# Patient Record
Sex: Female | Born: 1955 | Race: White | Hispanic: No | Smoking: Never smoker
Health system: Southern US, Community
[De-identification: ages and names within clinical notes are randomized; demographics above are authoritative.]

## PROBLEM LIST (undated history)

## (undated) DIAGNOSIS — I4891 Unspecified atrial fibrillation: Secondary | ICD-10-CM

## (undated) DIAGNOSIS — I509 Heart failure, unspecified: Secondary | ICD-10-CM

## (undated) DIAGNOSIS — G4700 Insomnia, unspecified: Secondary | ICD-10-CM

## (undated) DIAGNOSIS — M1990 Unspecified osteoarthritis, unspecified site: Secondary | ICD-10-CM

## (undated) DIAGNOSIS — I4819 Other persistent atrial fibrillation: Secondary | ICD-10-CM

## (undated) DIAGNOSIS — F32A Depression, unspecified: Secondary | ICD-10-CM

## (undated) DIAGNOSIS — I10 Essential (primary) hypertension: Secondary | ICD-10-CM

## (undated) DIAGNOSIS — M25562 Pain in left knee: Secondary | ICD-10-CM

## (undated) DIAGNOSIS — M25561 Pain in right knee: Secondary | ICD-10-CM

## (undated) DIAGNOSIS — N289 Disorder of kidney and ureter, unspecified: Secondary | ICD-10-CM

## (undated) DIAGNOSIS — F329 Major depressive disorder, single episode, unspecified: Secondary | ICD-10-CM

## (undated) HISTORY — PX: OTHER SURGICAL HISTORY: SHX169

## (undated) HISTORY — PX: ABDOMINAL HYSTERECTOMY: SHX81

## (undated) HISTORY — DX: Essential (primary) hypertension: I10

## (undated) HISTORY — DX: Morbid (severe) obesity due to excess calories: E66.01

## (undated) HISTORY — DX: Unspecified atrial fibrillation: I48.91

## (undated) HISTORY — DX: Unspecified osteoarthritis, unspecified site: M19.90

## (undated) HISTORY — PX: TONSILLECTOMY: SUR1361

## (undated) HISTORY — DX: Insomnia, unspecified: G47.00

## (undated) HISTORY — DX: Pain in left knee: M25.562

## (undated) HISTORY — PX: VESICOVAGINAL FISTULA CLOSURE W/ TAH: SUR271

## (undated) HISTORY — DX: Depression, unspecified: F32.A

## (undated) HISTORY — DX: Major depressive disorder, single episode, unspecified: F32.9

## (undated) HISTORY — DX: Pain in right knee: M25.561

## (undated) HISTORY — DX: Other persistent atrial fibrillation: I48.19

---

## 1998-11-14 ENCOUNTER — Emergency Department (HOSPITAL_COMMUNITY): Admission: EM | Admit: 1998-11-14 | Discharge: 1998-11-14 | Payer: Self-pay | Admitting: Emergency Medicine

## 1998-12-26 ENCOUNTER — Inpatient Hospital Stay (HOSPITAL_COMMUNITY): Admission: EM | Admit: 1998-12-26 | Discharge: 1998-12-28 | Payer: Self-pay | Admitting: Internal Medicine

## 1999-04-03 HISTORY — PX: CHOLECYSTECTOMY: SHX55

## 1999-06-13 ENCOUNTER — Emergency Department (HOSPITAL_COMMUNITY): Admission: EM | Admit: 1999-06-13 | Discharge: 1999-06-13 | Payer: Self-pay | Admitting: *Deleted

## 2000-05-22 ENCOUNTER — Encounter: Admission: RE | Admit: 2000-05-22 | Discharge: 2000-08-20 | Payer: Self-pay | Admitting: Surgical Oncology

## 2000-07-08 ENCOUNTER — Encounter (HOSPITAL_COMMUNITY): Admission: RE | Admit: 2000-07-08 | Discharge: 2000-08-07 | Payer: Self-pay | Admitting: *Deleted

## 2000-12-04 ENCOUNTER — Other Ambulatory Visit: Admission: RE | Admit: 2000-12-04 | Discharge: 2000-12-04 | Payer: Self-pay | Admitting: Obstetrics and Gynecology

## 2001-06-26 ENCOUNTER — Emergency Department (HOSPITAL_COMMUNITY): Admission: EM | Admit: 2001-06-26 | Discharge: 2001-06-27 | Payer: Self-pay | Admitting: Emergency Medicine

## 2001-06-27 ENCOUNTER — Encounter: Payer: Self-pay | Admitting: Emergency Medicine

## 2001-09-16 ENCOUNTER — Ambulatory Visit (HOSPITAL_COMMUNITY): Admission: RE | Admit: 2001-09-16 | Discharge: 2001-09-16 | Payer: Self-pay | Admitting: Ophthalmology

## 2001-12-18 ENCOUNTER — Encounter: Payer: Self-pay | Admitting: Internal Medicine

## 2001-12-18 ENCOUNTER — Observation Stay (HOSPITAL_COMMUNITY): Admission: EM | Admit: 2001-12-18 | Discharge: 2001-12-18 | Payer: Self-pay | Admitting: Internal Medicine

## 2001-12-19 ENCOUNTER — Encounter (HOSPITAL_COMMUNITY): Admission: RE | Admit: 2001-12-19 | Discharge: 2002-01-18 | Payer: Self-pay | Admitting: Cardiology

## 2002-08-19 ENCOUNTER — Inpatient Hospital Stay (HOSPITAL_COMMUNITY): Admission: EM | Admit: 2002-08-19 | Discharge: 2002-08-21 | Payer: Self-pay | Admitting: Emergency Medicine

## 2002-12-14 ENCOUNTER — Ambulatory Visit (HOSPITAL_COMMUNITY): Admission: RE | Admit: 2002-12-14 | Discharge: 2002-12-14 | Payer: Self-pay | Admitting: Pulmonary Disease

## 2003-07-08 ENCOUNTER — Ambulatory Visit (HOSPITAL_COMMUNITY): Admission: RE | Admit: 2003-07-08 | Discharge: 2003-07-08 | Payer: Self-pay | Admitting: Obstetrics & Gynecology

## 2003-09-07 ENCOUNTER — Inpatient Hospital Stay (HOSPITAL_COMMUNITY): Admission: RE | Admit: 2003-09-07 | Discharge: 2003-09-10 | Payer: Self-pay | Admitting: Obstetrics & Gynecology

## 2004-04-19 ENCOUNTER — Other Ambulatory Visit: Admission: RE | Admit: 2004-04-19 | Discharge: 2004-04-19 | Payer: Self-pay | Admitting: Dermatology

## 2004-12-20 ENCOUNTER — Ambulatory Visit: Payer: Self-pay | Admitting: Orthopedic Surgery

## 2005-01-01 ENCOUNTER — Ambulatory Visit: Payer: Self-pay | Admitting: Orthopedic Surgery

## 2005-01-18 ENCOUNTER — Ambulatory Visit: Payer: Self-pay | Admitting: Orthopedic Surgery

## 2005-01-23 ENCOUNTER — Ambulatory Visit (HOSPITAL_COMMUNITY): Admission: RE | Admit: 2005-01-23 | Discharge: 2005-01-23 | Payer: Self-pay | Admitting: Orthopedic Surgery

## 2005-01-23 ENCOUNTER — Ambulatory Visit: Payer: Self-pay | Admitting: Orthopedic Surgery

## 2005-01-25 ENCOUNTER — Ambulatory Visit: Payer: Self-pay | Admitting: Orthopedic Surgery

## 2005-01-26 ENCOUNTER — Encounter (HOSPITAL_COMMUNITY): Admission: RE | Admit: 2005-01-26 | Discharge: 2005-02-25 | Payer: Self-pay | Admitting: Orthopedic Surgery

## 2005-02-28 ENCOUNTER — Ambulatory Visit: Payer: Self-pay | Admitting: Orthopedic Surgery

## 2005-04-02 HISTORY — PX: COLONOSCOPY: SHX174

## 2006-03-14 ENCOUNTER — Ambulatory Visit: Payer: Self-pay | Admitting: Internal Medicine

## 2006-03-21 ENCOUNTER — Ambulatory Visit: Payer: Self-pay | Admitting: Internal Medicine

## 2006-03-21 ENCOUNTER — Ambulatory Visit (HOSPITAL_COMMUNITY): Admission: RE | Admit: 2006-03-21 | Discharge: 2006-03-21 | Payer: Self-pay | Admitting: Internal Medicine

## 2006-03-21 ENCOUNTER — Encounter (INDEPENDENT_AMBULATORY_CARE_PROVIDER_SITE_OTHER): Payer: Self-pay | Admitting: Specialist

## 2007-01-14 ENCOUNTER — Emergency Department (HOSPITAL_COMMUNITY): Admission: EM | Admit: 2007-01-14 | Discharge: 2007-01-14 | Payer: Self-pay | Admitting: Emergency Medicine

## 2007-09-02 ENCOUNTER — Emergency Department (HOSPITAL_COMMUNITY): Admission: EM | Admit: 2007-09-02 | Discharge: 2007-09-02 | Payer: Self-pay | Admitting: Emergency Medicine

## 2008-06-02 ENCOUNTER — Emergency Department (HOSPITAL_COMMUNITY): Admission: EM | Admit: 2008-06-02 | Discharge: 2008-06-02 | Payer: Self-pay | Admitting: Emergency Medicine

## 2008-06-07 ENCOUNTER — Ambulatory Visit (HOSPITAL_COMMUNITY): Admission: RE | Admit: 2008-06-07 | Discharge: 2008-06-07 | Payer: Self-pay | Admitting: Pulmonary Disease

## 2008-10-26 ENCOUNTER — Emergency Department (HOSPITAL_COMMUNITY): Admission: EM | Admit: 2008-10-26 | Discharge: 2008-10-26 | Payer: Self-pay | Admitting: Emergency Medicine

## 2009-05-23 ENCOUNTER — Ambulatory Visit: Payer: Self-pay | Admitting: Cardiology

## 2009-05-23 ENCOUNTER — Inpatient Hospital Stay (HOSPITAL_COMMUNITY): Admission: EM | Admit: 2009-05-23 | Discharge: 2009-05-27 | Payer: Self-pay | Admitting: Emergency Medicine

## 2009-05-24 ENCOUNTER — Encounter (INDEPENDENT_AMBULATORY_CARE_PROVIDER_SITE_OTHER): Payer: Self-pay | Admitting: Internal Medicine

## 2009-05-27 ENCOUNTER — Encounter (INDEPENDENT_AMBULATORY_CARE_PROVIDER_SITE_OTHER): Payer: Self-pay | Admitting: *Deleted

## 2009-05-27 LAB — CONVERTED CEMR LAB: Hgb A1c MFr Bld: 5.8 %

## 2009-05-31 ENCOUNTER — Encounter (INDEPENDENT_AMBULATORY_CARE_PROVIDER_SITE_OTHER): Payer: Self-pay | Admitting: *Deleted

## 2009-05-31 DIAGNOSIS — I4891 Unspecified atrial fibrillation: Secondary | ICD-10-CM | POA: Insufficient documentation

## 2009-05-31 DIAGNOSIS — J189 Pneumonia, unspecified organism: Secondary | ICD-10-CM | POA: Insufficient documentation

## 2009-05-31 DIAGNOSIS — J1189 Influenza due to unidentified influenza virus with other manifestations: Secondary | ICD-10-CM | POA: Insufficient documentation

## 2009-05-31 DIAGNOSIS — E876 Hypokalemia: Secondary | ICD-10-CM | POA: Insufficient documentation

## 2009-06-02 ENCOUNTER — Encounter: Payer: Self-pay | Admitting: Adult Health

## 2009-06-02 ENCOUNTER — Ambulatory Visit: Payer: Self-pay | Admitting: Cardiology

## 2009-06-02 DIAGNOSIS — I10 Essential (primary) hypertension: Secondary | ICD-10-CM | POA: Insufficient documentation

## 2009-06-03 ENCOUNTER — Telehealth (INDEPENDENT_AMBULATORY_CARE_PROVIDER_SITE_OTHER): Payer: Self-pay

## 2009-06-08 ENCOUNTER — Ambulatory Visit: Payer: Self-pay | Admitting: Cardiology

## 2009-06-13 ENCOUNTER — Ambulatory Visit: Payer: Self-pay | Admitting: Cardiology

## 2009-06-13 LAB — CONVERTED CEMR LAB: POC INR: 2.2

## 2009-06-15 ENCOUNTER — Ambulatory Visit: Payer: Self-pay | Admitting: Cardiology

## 2009-06-15 DIAGNOSIS — R002 Palpitations: Secondary | ICD-10-CM | POA: Insufficient documentation

## 2009-06-20 ENCOUNTER — Ambulatory Visit: Payer: Self-pay | Admitting: Cardiology

## 2009-06-27 ENCOUNTER — Encounter (INDEPENDENT_AMBULATORY_CARE_PROVIDER_SITE_OTHER): Payer: Self-pay | Admitting: *Deleted

## 2009-06-27 ENCOUNTER — Ambulatory Visit: Payer: Self-pay | Admitting: Cardiology

## 2009-06-28 ENCOUNTER — Encounter (INDEPENDENT_AMBULATORY_CARE_PROVIDER_SITE_OTHER): Payer: Self-pay

## 2009-06-30 ENCOUNTER — Telehealth: Payer: Self-pay | Admitting: Cardiology

## 2009-07-04 ENCOUNTER — Telehealth: Payer: Self-pay | Admitting: Cardiology

## 2009-07-04 ENCOUNTER — Ambulatory Visit: Payer: Self-pay | Admitting: Cardiology

## 2009-07-11 ENCOUNTER — Ambulatory Visit: Payer: Self-pay | Admitting: Cardiology

## 2009-07-11 ENCOUNTER — Encounter (INDEPENDENT_AMBULATORY_CARE_PROVIDER_SITE_OTHER): Payer: Self-pay | Admitting: *Deleted

## 2009-07-11 ENCOUNTER — Encounter: Payer: Self-pay | Admitting: Cardiology

## 2009-07-11 LAB — CONVERTED CEMR LAB: POC INR: 3

## 2009-07-12 LAB — CONVERTED CEMR LAB
BUN: 12 mg/dL (ref 6–23)
Chloride: 105 meq/L (ref 96–112)
Glucose, Bld: 116 mg/dL — ABNORMAL HIGH (ref 70–99)
Potassium: 4.7 meq/L (ref 3.5–5.3)
Sodium: 141 meq/L (ref 135–145)

## 2009-07-14 ENCOUNTER — Ambulatory Visit: Payer: Self-pay | Admitting: Cardiology

## 2009-07-14 ENCOUNTER — Ambulatory Visit (HOSPITAL_COMMUNITY): Admission: RE | Admit: 2009-07-14 | Discharge: 2009-07-14 | Payer: Self-pay | Admitting: Cardiology

## 2009-07-18 ENCOUNTER — Ambulatory Visit (HOSPITAL_COMMUNITY): Admission: RE | Admit: 2009-07-18 | Discharge: 2009-07-18 | Payer: Self-pay | Admitting: Cardiovascular Disease

## 2009-07-18 ENCOUNTER — Ambulatory Visit: Payer: Self-pay | Admitting: Cardiovascular Disease

## 2009-07-18 ENCOUNTER — Telehealth (INDEPENDENT_AMBULATORY_CARE_PROVIDER_SITE_OTHER): Payer: Self-pay

## 2009-07-18 ENCOUNTER — Encounter: Payer: Self-pay | Admitting: Internal Medicine

## 2009-07-18 DIAGNOSIS — F411 Generalized anxiety disorder: Secondary | ICD-10-CM | POA: Insufficient documentation

## 2009-07-18 DIAGNOSIS — R0602 Shortness of breath: Secondary | ICD-10-CM | POA: Insufficient documentation

## 2009-07-19 ENCOUNTER — Encounter: Payer: Self-pay | Admitting: Cardiology

## 2009-07-19 ENCOUNTER — Encounter: Payer: Self-pay | Admitting: Internal Medicine

## 2009-07-19 ENCOUNTER — Ambulatory Visit: Payer: Self-pay | Admitting: Cardiology

## 2009-07-19 ENCOUNTER — Encounter: Payer: Self-pay | Admitting: Cardiovascular Disease

## 2009-07-21 ENCOUNTER — Encounter: Payer: Self-pay | Admitting: Cardiology

## 2009-07-22 ENCOUNTER — Encounter: Payer: Self-pay | Admitting: Internal Medicine

## 2009-07-25 ENCOUNTER — Encounter: Payer: Self-pay | Admitting: Cardiology

## 2009-07-25 ENCOUNTER — Encounter (INDEPENDENT_AMBULATORY_CARE_PROVIDER_SITE_OTHER): Payer: Self-pay | Admitting: Pharmacist

## 2009-07-26 ENCOUNTER — Encounter: Payer: Self-pay | Admitting: Cardiology

## 2009-07-26 ENCOUNTER — Telehealth: Payer: Self-pay | Admitting: Cardiology

## 2009-07-26 LAB — CONVERTED CEMR LAB
INR: 1.7
Prothrombin Time: 21 s

## 2009-07-29 ENCOUNTER — Telehealth: Payer: Self-pay | Admitting: Cardiology

## 2009-07-29 ENCOUNTER — Encounter: Payer: Self-pay | Admitting: Cardiology

## 2009-08-04 ENCOUNTER — Encounter: Payer: Self-pay | Admitting: Cardiology

## 2009-08-04 ENCOUNTER — Telehealth (INDEPENDENT_AMBULATORY_CARE_PROVIDER_SITE_OTHER): Payer: Self-pay | Admitting: *Deleted

## 2009-08-04 LAB — CONVERTED CEMR LAB
POC INR: 1.6
Prothrombin Time: 19.2 s

## 2009-08-11 ENCOUNTER — Telehealth (INDEPENDENT_AMBULATORY_CARE_PROVIDER_SITE_OTHER): Payer: Self-pay

## 2009-08-12 ENCOUNTER — Ambulatory Visit: Payer: Self-pay | Admitting: Cardiology

## 2009-08-15 ENCOUNTER — Telehealth: Payer: Self-pay | Admitting: Cardiology

## 2009-08-15 ENCOUNTER — Encounter: Payer: Self-pay | Admitting: Cardiology

## 2009-08-15 LAB — CONVERTED CEMR LAB: POC INR: 2.1

## 2009-08-16 ENCOUNTER — Encounter: Payer: Self-pay | Admitting: Cardiology

## 2009-08-24 ENCOUNTER — Ambulatory Visit: Payer: Self-pay | Admitting: Internal Medicine

## 2009-08-24 ENCOUNTER — Encounter: Payer: Self-pay | Admitting: Internal Medicine

## 2009-08-30 LAB — CONVERTED CEMR LAB
CO2: 27 meq/L (ref 19–32)
GFR calc non Af Amer: 84.93 mL/min (ref >60)
Glucose, Bld: 95 mg/dL (ref 70–99)
INR: 2.1 — ABNORMAL HIGH (ref 0.8–1.0)
Potassium: 4.4 meq/L (ref 3.5–5.1)

## 2009-08-31 ENCOUNTER — Encounter: Payer: Self-pay | Admitting: Cardiology

## 2009-08-31 ENCOUNTER — Telehealth: Payer: Self-pay | Admitting: Cardiology

## 2009-08-31 LAB — CONVERTED CEMR LAB
POC INR: 2.8
Prothrombin Time: 33.6 s

## 2009-09-01 ENCOUNTER — Encounter: Payer: Self-pay | Admitting: Cardiology

## 2009-09-05 ENCOUNTER — Ambulatory Visit: Admission: RE | Admit: 2009-09-05 | Discharge: 2009-09-05 | Payer: Self-pay | Admitting: *Deleted

## 2009-09-06 ENCOUNTER — Encounter: Payer: Self-pay | Admitting: Cardiology

## 2009-09-06 ENCOUNTER — Telehealth: Payer: Self-pay | Admitting: Cardiology

## 2009-09-06 LAB — CONVERTED CEMR LAB: POC INR: 2.6

## 2009-09-09 ENCOUNTER — Ambulatory Visit: Payer: Self-pay | Admitting: Cardiology

## 2009-09-14 ENCOUNTER — Telehealth: Payer: Self-pay | Admitting: Cardiology

## 2009-09-14 ENCOUNTER — Encounter: Payer: Self-pay | Admitting: Cardiology

## 2009-09-19 ENCOUNTER — Observation Stay (HOSPITAL_COMMUNITY): Admission: AD | Admit: 2009-09-19 | Discharge: 2009-09-20 | Payer: Self-pay | Admitting: Internal Medicine

## 2009-09-19 ENCOUNTER — Ambulatory Visit: Payer: Self-pay | Admitting: Internal Medicine

## 2009-09-21 ENCOUNTER — Encounter: Payer: Self-pay | Admitting: Cardiology

## 2009-09-21 ENCOUNTER — Telehealth (INDEPENDENT_AMBULATORY_CARE_PROVIDER_SITE_OTHER): Payer: Self-pay | Admitting: *Deleted

## 2009-09-21 LAB — CONVERTED CEMR LAB: POC INR: 2.7

## 2009-10-05 ENCOUNTER — Ambulatory Visit: Payer: Self-pay | Admitting: Internal Medicine

## 2009-10-10 ENCOUNTER — Inpatient Hospital Stay (HOSPITAL_COMMUNITY): Admission: AD | Admit: 2009-10-10 | Discharge: 2009-10-14 | Payer: Self-pay | Admitting: Internal Medicine

## 2009-10-10 ENCOUNTER — Ambulatory Visit: Payer: Self-pay | Admitting: Internal Medicine

## 2009-10-18 ENCOUNTER — Ambulatory Visit: Payer: Self-pay | Admitting: Cardiology

## 2009-10-18 ENCOUNTER — Encounter (INDEPENDENT_AMBULATORY_CARE_PROVIDER_SITE_OTHER): Payer: Self-pay | Admitting: *Deleted

## 2009-10-18 DIAGNOSIS — R252 Cramp and spasm: Secondary | ICD-10-CM | POA: Insufficient documentation

## 2009-10-18 LAB — CONVERTED CEMR LAB
CO2: 30 meq/L
Calcium: 9.4 mg/dL
Magnesium: 1.9 mg/dL
POC INR: 2.6
Sodium: 142 meq/L

## 2009-10-19 ENCOUNTER — Inpatient Hospital Stay (HOSPITAL_COMMUNITY): Admission: EM | Admit: 2009-10-19 | Discharge: 2009-10-22 | Payer: Self-pay | Admitting: Emergency Medicine

## 2009-10-19 ENCOUNTER — Ambulatory Visit: Payer: Self-pay | Admitting: Internal Medicine

## 2009-10-19 LAB — CONVERTED CEMR LAB
CO2: 30 meq/L (ref 19–32)
Calcium: 9.4 mg/dL (ref 8.4–10.5)
Chloride: 103 meq/L (ref 96–112)
Creatinine, Ser: 0.67 mg/dL (ref 0.40–1.20)
Magnesium: 1.9 mg/dL (ref 1.5–2.5)

## 2009-10-20 ENCOUNTER — Encounter: Payer: Self-pay | Admitting: Internal Medicine

## 2009-10-22 ENCOUNTER — Encounter: Payer: Self-pay | Admitting: Internal Medicine

## 2009-10-26 ENCOUNTER — Ambulatory Visit: Payer: Self-pay | Admitting: Cardiology

## 2009-10-26 LAB — CONVERTED CEMR LAB: POC INR: 2.5

## 2009-11-02 ENCOUNTER — Ambulatory Visit: Payer: Self-pay | Admitting: Cardiology

## 2009-11-02 LAB — CONVERTED CEMR LAB: POC INR: 2.1

## 2009-11-08 ENCOUNTER — Encounter (INDEPENDENT_AMBULATORY_CARE_PROVIDER_SITE_OTHER): Payer: Self-pay | Admitting: *Deleted

## 2009-11-11 ENCOUNTER — Ambulatory Visit: Payer: Self-pay | Admitting: Cardiology

## 2009-11-11 ENCOUNTER — Ambulatory Visit: Payer: Self-pay | Admitting: Internal Medicine

## 2009-11-11 LAB — CONVERTED CEMR LAB: POC INR: 2

## 2009-11-14 ENCOUNTER — Ambulatory Visit: Payer: Self-pay | Admitting: Cardiology

## 2009-11-17 ENCOUNTER — Ambulatory Visit: Payer: Self-pay | Admitting: Cardiology

## 2009-11-17 LAB — CONVERTED CEMR LAB: POC INR: 2

## 2009-12-08 ENCOUNTER — Ambulatory Visit: Payer: Self-pay | Admitting: Cardiology

## 2009-12-08 LAB — CONVERTED CEMR LAB: POC INR: 2.8

## 2009-12-15 ENCOUNTER — Ambulatory Visit: Payer: Self-pay | Admitting: Internal Medicine

## 2009-12-30 ENCOUNTER — Encounter (INDEPENDENT_AMBULATORY_CARE_PROVIDER_SITE_OTHER): Payer: Self-pay | Admitting: *Deleted

## 2010-01-05 ENCOUNTER — Ambulatory Visit: Payer: Self-pay

## 2010-01-05 LAB — CONVERTED CEMR LAB: POC INR: 1.2

## 2010-01-11 ENCOUNTER — Ambulatory Visit: Payer: Self-pay | Admitting: Cardiology

## 2010-01-11 LAB — CONVERTED CEMR LAB: POC INR: 2.5

## 2010-02-01 ENCOUNTER — Ambulatory Visit (HOSPITAL_COMMUNITY): Admission: RE | Admit: 2010-02-01 | Discharge: 2010-02-01 | Payer: Self-pay | Admitting: Pulmonary Disease

## 2010-02-01 ENCOUNTER — Encounter: Payer: Self-pay | Admitting: Orthopedic Surgery

## 2010-02-06 ENCOUNTER — Encounter (INDEPENDENT_AMBULATORY_CARE_PROVIDER_SITE_OTHER): Payer: Self-pay | Admitting: *Deleted

## 2010-02-09 ENCOUNTER — Ambulatory Visit: Payer: Self-pay | Admitting: Cardiology

## 2010-02-09 LAB — CONVERTED CEMR LAB: POC INR: 2.7

## 2010-03-01 ENCOUNTER — Ambulatory Visit: Payer: Self-pay | Admitting: Orthopedic Surgery

## 2010-03-01 DIAGNOSIS — M84376A Stress fracture, unspecified foot, initial encounter for fracture: Secondary | ICD-10-CM | POA: Insufficient documentation

## 2010-03-01 DIAGNOSIS — M659 Synovitis and tenosynovitis, unspecified: Secondary | ICD-10-CM | POA: Insufficient documentation

## 2010-03-09 ENCOUNTER — Ambulatory Visit: Payer: Self-pay | Admitting: Cardiology

## 2010-03-09 ENCOUNTER — Inpatient Hospital Stay (HOSPITAL_COMMUNITY): Admission: EM | Admit: 2010-03-09 | Discharge: 2009-07-21 | Payer: Self-pay | Admitting: Emergency Medicine

## 2010-03-09 LAB — CONVERTED CEMR LAB: POC INR: 2.4

## 2010-04-12 ENCOUNTER — Ambulatory Visit: Admission: RE | Admit: 2010-04-12 | Discharge: 2010-04-12 | Payer: Self-pay | Source: Home / Self Care

## 2010-04-19 ENCOUNTER — Telehealth: Payer: Self-pay | Admitting: Orthopedic Surgery

## 2010-04-25 ENCOUNTER — Ambulatory Visit
Admission: RE | Admit: 2010-04-25 | Discharge: 2010-04-25 | Payer: Self-pay | Source: Home / Self Care | Attending: Orthopedic Surgery | Admitting: Orthopedic Surgery

## 2010-04-30 LAB — CONVERTED CEMR LAB
BUN: 12 mg/dL (ref 6–23)
Calcium: 9 mg/dL (ref 8.4–10.5)
GFR calc non Af Amer: 87.58 mL/min (ref >60)
Glucose, Bld: 87 mg/dL (ref 70–99)
Magnesium: 2.4 mg/dL (ref 1.5–2.5)

## 2010-05-04 NOTE — Medication Information (Signed)
Summary: Coumadin Clinic  Anticoagulant Therapy  Managed by: Weston Brass, PharmD PCP: [name] Supervising MD: [name] MD, [name] Indication 1: Atrial Fibrillation Lab Used: [facility] [place] Site: [place] PT 32.2 INR POC 2.7  Dietary changes: no    Health status changes: no    Bleeding/hemorrhagic complications: no    Recent/future hospitalizations: yes       Details: went to hospital to start Tikosyn this week but having to wait on amiodarone level before able to start Tikosyn  Any changes in medication regimen? no    Recent/future dental: no  Any missed doses?: no       Is patient compliant with meds? yes       Allergies: No Known Drug Allergies  Anticoagulation Management History:      Her anticoagulation is being managed by telephone today.  The patient is on warfarin for atrial fibrillation requiring cardioversion.  Negative risk factors for bleeding include an age less than 77 years old, no history of CVA/TIA, no history of GI bleeding, and absence of serious comorbidities.  The bleeding index is 'low risk'.  Positive CHADS2 values include History of HTN.  Negative CHADS2 values include History of CHF, Age > 18 years old, History of Diabetes, and Prior Stroke/CVA/TIA.  The start date was 06/02/2009.  Her last INR was 2.1 ratio.  Prothrombin time is 32.2.  Anticoagulation responsible provider: [name] MD, [name].  INR POC: 2.7.    Anticoagulation Management Assessment/Plan:      The patient's current anticoagulation dose is Coumadin 5 mg tabs: Take by mouth as directed by Coumadin clinic.  The target INR is 2.0-3.0.  The next INR is due 10/12/2009.  Anticoagulation instructions were given to patient.  Results were reviewed/authorized by Weston Brass, PharmD.  She was notified by Weston Brass PharmD.         Prior Anticoagulation Instructions: Spoke with Ernesto Rutherford RN [facility].  Order given for pt to continue coumadin 5mg  once daily except 7.5mg    on Sundays and Tuesdays.  Pt scheduled to be admitted to [facility] 09/19/09 for flecanide loading.  INR will be repeated after hospital discharge  Current Anticoagulation Instructions: INR 2.7 LMOM for pt to call back for dosing. Bethena Midget, RN, BSN  September 21, 2009 2:33 PM  Spoke with pt.  Continue same dose of 5mg  daily except 7.5mg  on Sunday and Thursday.  Recheck INR in office in 3 weeks.

## 2010-05-04 NOTE — Progress Notes (Signed)
Summary: coumadin management  Phone Note Other Incoming   Caller: Carolin Guernsey RN [facility] Reason for Call: Discuss lab or test results Summary of Call: Called with results of INR obtained on pt today.  INR 2.6   Order given for pt to continue coumadin 5mg  once daily except 7.5mg  on Sundays and Thursdays.  [facility] to recheck INR on 09/14/09.  Pt scheduled for flecanide challenge at [facility] on 09/19/09.  Initial call taken by: Vashti Hey RN,  September 06, 2009 1:30 PM     Anticoagulant Therapy  Managed by: Vashti Hey RN PCP: [name] Supervising MD: Diona Browner MD, Remi Deter Indication 1: Atrial Fibrillation Lab Used: [facility] [place] Site: [place] INR POC 2.6  Dietary changes: no    Health status changes: no    Bleeding/hemorrhagic complications: no    Recent/future hospitalizations: yes       Details: scheduled for flecanide challenge 09/19/09 at [facility]  Any changes in medication regimen? no    Recent/future dental: no  Any missed doses?: no       Is patient compliant with meds? yes         Anticoagulation Management History:      Her anticoagulation is being managed by telephone today.  The patient is on warfarin for atrial fibrillation requiring cardioversion.  Negative risk factors for bleeding include an age less than 101 years old, no history of CVA/TIA, no history of GI bleeding, and absence of serious comorbidities.  The bleeding index is 'low risk'.  Positive CHADS2 values include History of HTN.  Negative CHADS2 values include History of CHF, Age > 29 years old, History of Diabetes, and Prior Stroke/CVA/TIA.  The start date was 06/02/2009.  Her last INR was 2.1 ratio.  Anticoagulation responsible provider: Diona Browner MD, Remi Deter.  INR POC: 2.6.    Anticoagulation Management Assessment/Plan:      The patient's current anticoagulation dose is Coumadin 5 mg tabs: Take by mouth as directed by Coumadin clinic.  The target INR is 2.0-3.0.  The next INR is due  09/14/2009.  Anticoagulation instructions were given to patient.  Results were reviewed/authorized by Vashti Hey RN.  She was notified by Vashti Hey RN.         Prior Anticoagulation Instructions: Called with results of PT/INR obtained on pt today.  PT 33.6  INR 2.8  Order given for pt to continue coumadin 5mg  once daily except 7.5mg  on Sundays and Thursdays and [facility] to recheck INR on 09/07/09.  Current Anticoagulation Instructions: Called with results of INR obtained on pt today.  INR 2.6   Order given for pt to continue coumadin 5mg  once daily except 7.5mg  on Sundays and Thursdays.  [facility] to recheck INR on 09/14/09.  Pt scheduled for flecanide challenge at [facility] on 09/19/09.

## 2010-05-04 NOTE — Assessment & Plan Note (Signed)
Summary: POST HOSP [facility] PER HOSP CARD/TG   Visit Type:  Follow-up Referring Provider:  [name], MD Primary Provider:  [name]  CC:  SOB .  History of Present Illness: [name] in today for her atrial fibrillation.  She failed Tikosyn. She is too heavy for ablation. Dr. Johney Frame has told her that if she loses 21 pounds or more he might consider it.  She is asymptomatic at this point. She's had a tendency for bradycardia in the hospital. She saw Dr. Johney Frame last week and found her to be a little bit fast. He increased her diltiazem to 240 mg a day. She is no longer on a beta blocker.    Current Medications (verified): 1)  Alprazolam 1 Mg Tabs (Alprazolam) .... As Needed 2)  Coumadin 5 Mg Tabs (Warfarin Sodium) .... Take By Mouth As Directed By Coumadin Clinic 3)  Oxycodone-Acetaminophen 5-325 Mg Tabs (Oxycodone-Acetaminophen) .... Take 1 Tablet By Mouth Four Times A Day As Needed 4)  Furosemide 40 Mg Tabs (Furosemide) .... Take One Tablet By Mouth Daily. 5)  Aspir-Low 81 Mg Tbec (Aspirin) .... Take 1 Tab Daily 6)  Lisinopril 20 Mg Tabs (Lisinopril) .... Take 1 Tablet By Mouth Once Daily 7)  Diltiazem Hcl Er Beads 240 Mg Xr24h-Cap (Diltiazem Hcl Er Beads) .... Take One Capsule By Mouth Daily  Allergies (verified): No Known Drug Allergies  Past History:  Past Medical History: Last updated: 08/24/2009 Persistent atrial fibrillation Morbid Obesity HTN DJD denies h/o rheumatic fever  denies h/o diabetes, CVA  Past Surgical History: Last updated: 08/24/2009 bilateral knee surgery hysterectomy tonsillectomy right ankle surgery cardiac cath 1990s, normal per patient Cholecystectomy 2001  Family History: Last updated: 06-06-2009 Father:deceased age 3 lung cancer Mother:deceased (murdered) age 63  Social History: Last updated: 08/24/2009 separated and lives in [place] Tobacco Use - No.  Alcohol Use - no Regular Exercise - no Drug Use - no  Risk  Factors: Exercise: no (2009-06-06)  Risk Factors: Smoking Status: never (11/11/2009)  Review of Systems       nother than history of present illness  Vital Signs:  Patient profile:   55 year old female Weight:      321 pounds O2 Sat:      97 % on Room air Pulse rate:   59 / minute BP sitting:   136 / 90  (right arm)  Vitals Entered By: Dreama Saa, CNA (November 14, 2009 10:31 AM)  O2 Flow:  Room air  Physical Exam  General:  obese. no acute distress Head:  normocephalic and atraumatic Eyes:  PERRLA/EOM intact; conjunctiva and lids normal. Neck:  Neck supple, no JVD. No masses, thyromegaly or abnormal cervical nodes. Lungs:  Clear bilaterally to auscultation and percussion. Heart:  PMI poorly appreciated, regular rate and rhythm, variable S1-S2 Msk:  Back normal, normal gait. Muscle strength and tone normal. Pulses:  pulses normal in all 4 extremities Extremities:  No clubbing or cyanosis. Neurologic:  Alert and oriented x 3. Skin:  Intact without lesions or rashes. Psych:  Normal affect.   EKG  Procedure date:  11/14/2009  Findings:      patient verbalizing, left axis deviation, low voltage, nonspecific ST segment changes, no acute changes.  Impression & Recommendations:  Problem # 1:  FIBRILLATION, ATRIAL (ICD-427.31) her That rate is well controlled. She remains on Coumadin. She will try to lose weight to potential he have an ablation. I will see her back in a year. The following medications  were removed from the medication list:    Metoprolol Succinate 50 Mg Xr24h-tab (Metoprolol succinate) .Marland Kitchen... 1 by mouth daily Her updated medication list for this problem includes:    Coumadin 5 Mg Tabs (Warfarin sodium) .Marland Kitchen... Take by mouth as directed by coumadin clinic    Aspir-low 81 Mg Tbec (Aspirin) .Marland Kitchen... Take 1 tab daily  Problem # 2:  COUMADIN THERAPY (ICD-V58.61) Assessment: Unchanged  Problem # 3:  OBESITY-MORBID (>100') (ICD-278.01) Assessment:  Unchanged  Patient Instructions: 1)  Your physician recommends that you schedule a follow-up appointment in: 1 year

## 2010-05-04 NOTE — Medication Information (Signed)
Summary: ccr-lr  Anticoagulant Therapy  Managed by: Vashti Hey, RN PCP: [name] Supervising MD: Myrtis Ser MD, Tinnie Gens Indication 1: Atrial Fibrillation Lab Used: [facility] [place] Site: [place] INR POC 2.0  Dietary changes: no    Health status changes: no    Bleeding/hemorrhagic complications: no    Recent/future hospitalizations: no    Any changes in medication regimen? no    Recent/future dental: no  Any missed doses?: no       Is patient compliant with meds? yes       Allergies: No Known Drug Allergies  Anticoagulation Management History:      The patient is taking warfarin and comes in today for a routine follow up visit.  She is being anticoagulated due to atrial fibrillation requiring cardioversion.  Negative risk factors for bleeding include an age less than 38 years old, no history of CVA/TIA, no history of GI bleeding, and absence of serious comorbidities.  The bleeding index is 'low risk'.  Positive CHADS2 values include History of HTN.  Negative CHADS2 values include History of CHF, Age > 3 years old, History of Diabetes, and Prior Stroke/CVA/TIA.  The start date was 06/02/2009.  Her last INR was 2.1 ratio.  Anticoagulation responsible provider: Myrtis Ser MD, Tinnie Gens.  INR POC: 2.0.  Cuvette Lot#: [id number].  Exp: 12/2010.    Anticoagulation Management Assessment/Plan:      The patient's current anticoagulation dose is Coumadin 5 mg tabs: Take by mouth as directed by Coumadin clinic.  The target INR is 2.0-3.0.  The next INR is due 11/17/2009.  Anticoagulation instructions were given to patient.  Results were reviewed/authorized by Vashti Hey, RN.  She was notified by Vashti Hey RN.         Prior Anticoagulation Instructions: INR 2.1 Take coumadin 1 1/2 tablets tonight then resume 1 tablet once daily except 1 1/2 tablets on Sundays and Thursdays  Current Anticoagulation Instructions: INR 2.0 Take coumadin 7.5mg  tonight then increase dose to 5mg   once daily except 7.5mg  on S,T,Th

## 2010-05-04 NOTE — Assessment & Plan Note (Signed)
Summary: pt having issues w/ SOB/post Cardioversion/tg   Visit Type:  Follow-up Primary Provider:  [name]  CC:  sob post cardioversion.  History of Present Illness: Jasmin Martin is seen today as an add on for dyspnea.  She is an obese female with PAF on coumadin.  She was just cardioverted by Dr [name] last Thursday.  Unfortunatley there is no note from this procedure.  Dr Anola Gurney note indicated a TEE [facility] was to be done but the patient tells me she only had a [facility].  She denies palpitaitions or syncope or SSCP but has SOB.  In talking to her it is obvious that anxiety is playing some role as she gets very worked up.  She denies fever, chills, sputum or cough.  She has been compliant with her comadin and will have her INR checked today.  Given her body habitus and dyspnea I think she should have a CT and CXR today then F/U with Dr. Juanetta Gosling.  She's had a previous echo with normla LV funciton.    Current Problems (verified): 1)  R/O Pe  (ICD-415.19) 2)  Anxiety State, Unspecified  (ICD-300.00) 3)  Dyspnea  (ICD-786.05) 4)  Palpitations  (ICD-785.1) 5)  Hypertension, Benign  (ICD-401.1) 6)  Hypokalemia  (ICD-276.8) 7)  Influenza  (ICD-487.8) 8)  Pneumonia  (ICD-486) 9)  Fibrillation, Atrial  (ICD-427.31)  Current Medications (verified): 1)  Alprazolam 0.5 Mg Tabs (Alprazolam) .... Take As Needed 2)  Cardizem La 300 Mg Xr24h-Tab (Diltiazem Hcl Coated Beads) .... Take 1 Tablet By Mouth Once Daily 3)  Coumadin 5 Mg Tabs (Warfarin Sodium) .... Take By Mouth As Directed By Coumadin Clinic 4)  Hydrocodone-Acetaminophen 5-500 Mg Tabs (Hydrocodone-Acetaminophen) .... Take As Needed For Pain 5)  Metoprolol Succinate 100 Mg Xr24h-Tab (Metoprolol Succinate) .... Take One Tablet By Mouth Daily  Allergies (verified): No Known Drug Allergies  Past History:  Past Medical History: Last updated: 06-03-09 Current Problems:  HYPOKALEMIA (ICD-276.8) INFLUENZA (ICD-487.8) PNEUMONIA  (ICD-486) FIBRILLATION, ATRIAL (ICD-427.31)  Past Surgical History: Last updated: 06/02/2009 bilateral knee surgery hysterectomy tonsillectomy right ankle surgery cardiac cath Cholecystectomy 2001  Family History: Last updated: 2009/06/03 Father:deceased age 18 lung cancer Mother:deceased (murdered) age 30  Social History: Last updated: 06/02/2009 separated Tobacco Use - No.  Alcohol Use - no Regular Exercise - no Drug Use - no  Review of Systems       Denies fever, malais, weight loss, blurry vision, decreased visual acuity, cough, sputum, SOB, hemoptysis, pleuritic pain, palpitaitons, heartburn, abdominal pain, melena, lower extremity edema, claudication, or rash.   Vital Signs:  Patient profile:   55 year old female Weight:      317 pounds Pulse rate:   64 / minute BP sitting:   158 / 71  (right arm)  Vitals Entered By: Dreama Saa, CNA (July 18, 2009 11:05 AM)  Physical Exam  General:  Affect appropriate Healthy:  appears stated age HEENT: normal Neck supple with no adenopathy JVP normal no bruits no thyromegaly Lungs clear with no wheezing and good diaphragmatic motion Heart:  S1/S2 no murmur,rub, gallop or click PMI normal Abdomen: benighn, BS positve, no tenderness, no AAA no bruit.  No HSM or HJR Distal pulses intact with no bruits No edema Neuro non-focal Skin warm and dry    Impression & Recommendations:  Problem # 1:  DYSPNEA (ICD-786.05) Post DCC with morbid obesity and anxiety.  CT and CXR F/U [place].  Heart seems ok Her updated medication list for this problem includes:  Cardizem La 300 Mg Xr24h-tab (Diltiazem hcl coated beads) .Marland Kitchen... Take 1 tablet by mouth once daily    Metoprolol Succinate 100 Mg Xr24h-tab (Metoprolol succinate) .Marland Kitchen... Take one tablet by mouth daily    Furosemide 20 Mg Tabs (Furosemide) .Marland Kitchen... Take 1 tablet by mouth by mouth once daily  Orders: T-Chest x-ray, 2 views (04540) Pulmonary Referral  (Pulmonary)  Problem # 2:  HYPERTENSION, BENIGN (ICD-401.1) Well contolled Her updated medication list for this problem includes:    Cardizem La 300 Mg Xr24h-tab (Diltiazem hcl coated beads) .Marland Kitchen... Take 1 tablet by mouth once daily    Metoprolol Succinate 100 Mg Xr24h-tab (Metoprolol succinate) .Marland Kitchen... Take one tablet by mouth daily    Furosemide 20 Mg Tabs (Furosemide) .Marland Kitchen... Take 1 tablet by mouth by mouth once daily  Problem # 3:  FIBRILLATION, ATRIAL (ICD-427.31) S/P DCC INR was 3.2 today  ECG shows maint of NSR  Stable Her updated medication list for this problem includes:    Coumadin 5 Mg Tabs (Warfarin sodium) .Marland Kitchen... Take by mouth as directed by coumadin clinic    Metoprolol Succinate 100 Mg Xr24h-tab (Metoprolol succinate) .Marland Kitchen... Take one tablet by mouth daily  Other Orders: CT (CT)  Patient Instructions: 1)  Your physician recommends that you schedule a follow-up appointment in: with Dr. [name] in 2 weeks. 2)  Your physician recommends that you continue on your current medications as directed. Please refer to the Current Medication list given to you today. 3)  A chest x-ray takes a picture of the organs and structures inside the chest, including the heart, lungs, and blood vessels. This test can show several things, including, whether the heart is enlarged; whether fluid is building up in the lungs; and whether pacemaker / defibrillator leads are still in place. 4)  Your physician has requested that you have a cardiac CT.  Cardiac computed tomography (CT) is a painless test that uses an x-ray machine to take clear, detailed pictures of your heart.  For further information please visit [email or web address]  Please follow instruction sheet as given. Prescriptions: FUROSEMIDE 20 MG TABS (FUROSEMIDE) Take 1 tablet by mouth by mouth once daily  #30 x 3   Entered by:   Larita Fife Via LPN   Authorized by:   Colon Branch, MD, [facility]   Signed by:   Larita Fife Via LPN on 98/01/9146   Method used:    Electronically to        [facility] (retail)       [address]       [place], [place]  [address]       Ph: [phone]       Fax: [phone]   RxID:   [id number]    EKG Report  Procedure date:  07/18/2009  Findings:      NSR 59 LAE LAD Otherwise normal

## 2010-05-04 NOTE — Progress Notes (Signed)
Summary: [place] [facility] [facility] FOR COUMADIN  Phone Note Call from Patient Call back at [phone]   Caller: Jasmin Martin WITH [facility] Reason for Call: Talk to Nurse Summary of Call: [facility] CALL FROM  [place]. INR 1.6 PT 19.2 PT ON 5MG  DAILY SINCE LAST FRIDAY 07/29/09 AND BEFORE WAS 7.5MG  ONE DAY AND 5MG  ALL OTHER. Initial call taken by: Faythe Ghee,  Aug 04, 2009 10:33 AM  Follow-up for Phone Call        See coumacare sheet. Follow-up by: Bethena Midget, RN, BSN,  Aug 04, 2009 1:56 PM

## 2010-05-04 NOTE — Assessment & Plan Note (Signed)
Summary: Jasmin Martin   Visit Type:  Follow-up Referring Provider:  [name], MD Primary Provider:  [name]   History of Present Illness: The patient presents today for routine electrophysiology followup. She recently presented for tikosyn load but was unable to start tikosyn due to prior amiodarone use (within 3 T1/2s).  She was discharged with plans to follow-up here today to reschedule tilosyn loading. The patient denies symptoms of palpitations, chest pain, shortness of breath, orthopnea, PND, lower extremity edema, dizziness, presyncope, syncope, or neurologic sequela.  SHe continues to have fatigue with her afib. The patient is tolerating medications without difficulties and is otherwise without complaint today.   Current Medications (verified): 1)  Alprazolam 1 Mg Tabs (Alprazolam) .... As Needed 2)  Cardizem La 360 Mg Xr24h-Tab (Diltiazem Hcl Coated Beads) .... Take 1 Tab Daily 3)  Coumadin 5 Mg Tabs (Warfarin Sodium) .... Take By Mouth As Directed By Coumadin Clinic 4)  Hydrocodone-Acetaminophen 5-500 Mg Tabs (Hydrocodone-Acetaminophen) .... Take As Needed For Pain 5)  Furosemide 40 Mg Tabs (Furosemide) .... Take One Tablet By Mouth Daily. 6)  Aspir-Low 81 Mg Tbec (Aspirin) .... Take 1 Tab Daily 7)  Metoprolol Succinate 100 Mg Xr24h-Tab (Metoprolol Succinate) .... Take One Tablet By Mouth Daily 8)  Potassium Chloride Crys Cr 20 Meq Cr-Tabs (Potassium Chloride Crys Cr) .... Take 1 Tablet By Mouth Once A Day  Allergies (verified): No Known Drug Allergies  Past History:  Past Medical History: Reviewed history from 08/24/2009 and no changes required. Persistent atrial fibrillation Morbid Obesity HTN DJD denies h/o rheumatic fever  denies h/o diabetes, CVA  Past Surgical History: Reviewed history from 08/24/2009 and no changes required. bilateral knee surgery hysterectomy tonsillectomy right ankle surgery cardiac cath 1990s, normal per  patient Cholecystectomy 2001  Social History: Reviewed history from 08/24/2009 and no changes required. separated and lives in [place] Tobacco Use - No.  Alcohol Use - no Regular Exercise - no Drug Use - no  Vital Signs:  Patient profile:   55 year old female Height:      64 inches Weight:      318 pounds BMI:     54.78 Pulse rate:   68 / minute BP sitting:   150 / 98  (left arm)  Vitals Entered By: Laurance Flatten CMA (October 05, 2009 2:13 PM)  Physical Exam  General:  morbidly obese Head:  normocephalic and atraumatic Eyes:  PERRLA/EOM intact; conjunctiva and lids normal. Mouth:  Teeth, gums and palate normal. Oral mucosa normal. Neck:  Neck supple, no JVD. No masses, thyromegaly or abnormal cervical nodes. Lungs:  Clear bilaterally to auscultation and percussion. Heart:  iRRR, no m/r/g Abdomen:  Bowel sounds positive; abdomen soft and non-tender without masses, organomegaly, or hernias noted. No hepatosplenomegaly. Msk:  Back normal, normal gait. Muscle strength and tone normal. Pulses:  pulses normal in all 4 extremities Extremities:  trace left pedal edema and trace right pedal edema.   Neurologic:  Alert and oriented x 3. Skin:  Intact without lesions or rashes. Psych:  Normal affect.   EKG  Procedure date:  10/05/2009  Findings:      afib,  C rates 70, Qtc 435, LAD  Impression & Recommendations:  Problem # 1:  FIBRILLATION, ATRIAL (ICD-427.31) The patient presents today for follow-up of afib. Her amiodarone level is confirmed to be <.3.  Her Qtc is 435.  Her INRs have been therapeutic.  We will schedule elective admission for tikosyn load at the  next available time.  Other Orders: TLB-BMP (Basic Metabolic Panel-BMET) (80048-METABOL) TLB-Magnesium (Mg) (83735-MG)  Patient Instructions: 1)  Plan Tikosyn admission on Monday, July 11th.  The hospital will call you when they have a bed available.  2)  Your physician recommends that you return for lab work  today.

## 2010-05-04 NOTE — Assessment & Plan Note (Signed)
Summary: post hosp per 3A call 05/27/2009/sn   Visit Type:  EPH Primary Provider:  [name]  CC:  palpitations.  History of Present Illness: [name] is a very pleasant 55 y/o morbidly obese CF who is on a weight loss program having lost 100lbs so far.  She was seen originally in Feb 2011 for Afib RVR by Dr.McDowell but is a patient of Dr. Daleen Squibb.  On discharge she was placed on cardiazem 240mg  along with ASA 325mg , She has a CHADS score of 1, hypertension.  At that time it was recommended that if her Afib persisted, she would be considered for initiation of coumadin and planned for DCCV.  Prior to visit, during hospitalization she had an echocardiogram showing mild to moderate concentric hypertrophy.  Normal systolic fx with EF of 55%. No WMA.(05/25/2009).  She also had a stress myoview/dobutamine on 12/2001 which stated that myocardial ischemia in the distribution of the LAD could not be excluded, with normal follow-up cardiac cath.  She has continued to experience elevated heart rate since discharge, no "crazy heart rates" per patient, as she experienced prior to her admission in Feb.  She continues to feel the heart rate as fast with some associated shortness of breath, but no dizziness or chest pain.  She has not exercised since discharge because she had be told to "take it easy."  She is anxious to go back to exercising as this makes her feel good.  Current Medications (verified): 1)  Tylenol Extra Strength 1000 Mg/13ml Liqd (Acetaminophen) .... Take 1 Tab Daily 2)  Alprazolam 0.5 Mg Tabs (Alprazolam) .... Take As Needed 3)  Cardizem La 300 Mg Xr24h-Tab (Diltiazem Hcl Coated Beads) .... Take 1 Tablet By Mouth Once Daily 4)  Aspir-Trin 325 Mg Tbec (Aspirin) .... Take 1 Tab Daily 5)  Coumadin 5 Mg Tabs (Warfarin Sodium) .... Take By Mouth As Directed By Coumadin Clinic  Allergies (verified): No Known Drug Allergies  Past History:  Past Surgical History: bilateral knee  surgery hysterectomy tonsillectomy right ankle surgery cardiac cath Cholecystectomy 2001 PMH-FH-SH reviewed-no changes except otherwise noted  Social History: separated Tobacco Use - No.  Alcohol Use - no Regular Exercise - no Drug Use - no  Review of Systems       Palpatations, some DOE.  Vital Signs:  Patient profile:   56 year old female Height:      64 inches Weight:      299 pounds BMI:     51.51 O2 Sat:      97 % on Room air Pulse rate:   101 / minute BP sitting:   139 / 90  (left arm)  Vitals Entered By: Dreama Saa, CNA (June 02, 2009 3:24 PM)  Nutrition Counseling: Patient's BMI is greater than 25 and therefore counseled on weight management options.  O2 Flow:  Room air  Physical Exam  General:  Well developed, well nourished, in no acute distress. Heart:  Irregular with tachycardia.  No MRG Abdomen:  Obese, NT 2+ BS. Msk:  Back normal, normal gait. Muscle strength and tone normal. Neurologic:  Alert and oriented x 3. Psych:  Normal affect.   EKG  Procedure date:  06/02/2009  Findings:      Atrial fibrillation with an uncontrolled ventricular response rate of: 105 bpm  Impression & Recommendations:  Problem # 1:  FIBRILLATION, ATRIAL (ICD-427.31) The patients heart rate is not well controlled on current dose of cardiazem.  This was discussed with Dr. Erlinda Hong  Dowell.  Recommendations to begin Cardiazem 300mg  daily. Also will begin her on coumadin 10mg  daily for two days and then 5mg  daily thereafter I discussed this  with coumadin clinic nurse, Misty Stanley, and coumadin teaching was completed with the patient by Misty Stanley as well.  She will have close follow-up concerning PT INR. next appointment in coumadin clinic 05/04/2009.  We plan on scheduling her for DCCV after she is therapeutic.  She will follow-up with Dr. [name] in a couple of weeks for HR evaluation and EKGand further discussion of DCCV.  Her updated medication list for this problem includes:     Aspir-trin 325 Mg Tbec (Aspirin) .Marland Kitchen... Take 1 tab daily    Coumadin 5 Mg Tabs (Warfarin sodium) .Marland Kitchen... Take by mouth as directed by coumadin clinic  Problem # 2:  HYPERTENSION, BENIGN (ICD-401.1) Her blood pressure is controlled at present. Will monitor this closely on follow-up appointments with increased dose of cardiazem to 300mg  daily. Her updated medication list for this problem includes:    Cardizem La 300 Mg Xr24h-tab (Diltiazem hcl coated beads) .Marland Kitchen... Take 1 tablet by mouth once daily    Aspir-trin 325 Mg Tbec (Aspirin) .Marland Kitchen... Take 1 tab daily  Patient Instructions: 1)  Your physician recommends that you schedule a follow-up appointment in: 2 weeks with MD and on Wednesday with coumadin clinic 2)  Your physician has recommended you make the following change in your medication: Increase Cardiazem to 300mg  by mouth once daily and start taking Coumadin 5mg  as directed by coumadin clinic Prescriptions: CARDIZEM LA 300 MG XR24H-TAB (DILTIAZEM HCL COATED BEADS) take 1 tablet by mouth once daily  #30 x 6   Entered by:   Larita Fife Via LPN   Authorized by:   Joni Reining, NP   Signed by:   Larita Fife Via LPN on 84/69/6295   Method used:   Electronically to        [facility] (retail)       [address]       [place], [place]  [address]       Ph: [phone]       Fax: [phone]   RxID:   [id number] COUMADIN 5 MG TABS (WARFARIN SODIUM) Take by mouth as directed by Coumadin clinic  #60 x 3   Entered by:   Larita Fife Via LPN   Authorized by:   Joni Reining, NP   Signed by:   Larita Fife Via LPN on 18/84/1660   Method used:   Electronically to        [facility] (retail)       [address]       [place], [place]  [address]       Ph: [phone]       Fax: [phone]   RxID:   [id number]   Appended Document: post hosp per 3A call 05/27/2009/sn Reviewed with Ms. Etrulia Zarr.  Patient remains in AF.  Reasonable to  consider limited course of Coumadin (CHADS2 score of 1) for elective DCCV and then ASA thereafter.  Will intiate Coumadin, decrease ASA dose, advance Cardizem for better HR control, and have her return to see Dr. Daleen Squibb to arrange DCCV after 4 weeks therapeutic Coumadin.

## 2010-05-04 NOTE — Assessment & Plan Note (Signed)
Summary: 2 WK F/U PER CHECKOUT ON 06/02/09/TG   Visit Type:  Follow-up Primary Provider:  [name]  CC:  heart racing since last night.  History of Present Illness: [name] returns today for followup of her atrial fibrillation with rapid ventricular response.  Please see the previous note for extensive details about her hospitalization and workup.  She is now on Coumadin. INR was therapeutic on 14 March. She is due a followup check on 28 March. If that is therapeutic she can then be set up for outpatient cardioversion.  She had a terrible night last night with palpitations rapid heartbeat. On last office visit, her diltiazem was increased from 240 mg to 300 mg.  She drinks a lot of caffeinated diet drinks. I told her to eliminate them.  Current Medications (verified): 1)  Alprazolam 0.5 Mg Tabs (Alprazolam) .... Take As Needed 2)  Cardizem La 300 Mg Xr24h-Tab (Diltiazem Hcl Coated Beads) .... Take 1 Tablet By Mouth Once Daily 3)  Coumadin 5 Mg Tabs (Warfarin Sodium) .... Take By Mouth As Directed By Coumadin Clinic 4)  Hydrocodone-Acetaminophen 5-500 Mg Tabs (Hydrocodone-Acetaminophen) .... Take As Needed For Pain 5)  Metoprolol Succinate 50 Mg Xr24h-Tab (Metoprolol Succinate) .... Take 1 Tablet By Mouth Every Morning  Allergies (verified): No Known Drug Allergies  Past History:  Past Medical History: Last updated: 06/18/2009 Current Problems:  HYPOKALEMIA (ICD-276.8) INFLUENZA (ICD-487.8) PNEUMONIA (ICD-486) FIBRILLATION, ATRIAL (ICD-427.31)  Past Surgical History: Last updated: 06/02/2009 bilateral knee surgery hysterectomy tonsillectomy right ankle surgery cardiac cath Cholecystectomy 2001  Family History: Last updated: 18-Jun-2009 Father:deceased age 46 lung cancer Mother:deceased (murdered) age 25  Social History: Last updated: 06/02/2009 separated Tobacco Use - No.  Alcohol Use - no Regular Exercise - no Drug Use - no  Risk  Factors: Exercise: no (06/18/2009)  Risk Factors: Smoking Status: never (Jun 18, 2009)  Review of Systems       negative other than history of present illness  Vital Signs:  Patient profile:   55 year old female Weight:      304 pounds Pulse rate:   89 / minute BP sitting:   140 / 100  (right arm)  Vitals Entered By: Dreama Saa, CNA (June 15, 2009 1:22 PM)  Physical Exam  General:  obese.  obese.   Head:  normocephalic and atraumatic Eyes:  PERRLA/EOM intact; conjunctiva and lids normal. Neck:  Neck supple, no JVD. No masses, thyromegaly or abnormal cervical nodes. Chest Aleksi Brummet:  no deformities or breast masses noted Lungs:  Clear bilaterally to auscultation and percussion. Heart:  PMI poorly appreciated, regular rate and rhythm, no gallop. There was S1-S2 Msk:  decreased ROM.  decreased ROM.   Pulses:  pulses normal in all 4 extremities Extremities:  trace left pedal edema and trace right pedal edema.  trace left pedal edema and trace right pedal edema.   Neurologic:  Alert and oriented x 3. Skin:  Intact without lesions or rashes. Psych:  Normal affect.   EKG  Procedure date:  06/15/2009  Findings:      atrial fibrillation heart rate of 110 beats per minute. Low voltage, poor R. progression in the anterior precordium.  Impression & Recommendations:  Problem # 1:  PALPITATIONS (ICD-785.1) Assessment Deteriorated  The following medications were removed from the medication list:    Aspirin 81 Mg Tbec (Aspirin) .Marland Kitchen... Take one tablet by mouth daily Her updated medication list for this problem includes:    Cardizem La 300 Mg Xr24h-tab (Diltiazem hcl coated  beads) .Marland Kitchen... Take 1 tablet by mouth once daily    Coumadin 5 Mg Tabs (Warfarin sodium) .Marland Kitchen... Take by mouth as directed by coumadin clinic  The following medications were removed from the medication list:    Aspirin 81 Mg Tbec (Aspirin) .Marland Kitchen... Take one tablet by mouth daily Her updated medication list for this  problem includes:    Cardizem La 300 Mg Xr24h-tab (Diltiazem hcl coated beads) .Marland Kitchen... Take 1 tablet by mouth once daily    Coumadin 5 Mg Tabs (Warfarin sodium) .Marland Kitchen... Take by mouth as directed by coumadin clinic    Metoprolol Succinate 50 Mg Xr24h-tab (Metoprolol succinate) .Marland Kitchen... Take 1 tablet by mouth every morning  Problem # 2:  HYPERTENSION, BENIGN (ICD-401.1) Assessment: Deteriorated  The following medications were removed from the medication list:    Aspirin 81 Mg Tbec (Aspirin) .Marland Kitchen... Take one tablet by mouth daily Her updated medication list for this problem includes:    Cardizem La 300 Mg Xr24h-tab (Diltiazem hcl coated beads) .Marland Kitchen... Take 1 tablet by mouth once daily  The following medications were removed from the medication list:    Aspirin 81 Mg Tbec (Aspirin) .Marland Kitchen... Take one tablet by mouth daily Her updated medication list for this problem includes:    Cardizem La 300 Mg Xr24h-tab (Diltiazem hcl coated beads) .Marland Kitchen... Take 1 tablet by mouth once daily    Metoprolol Succinate 50 Mg Xr24h-tab (Metoprolol succinate) .Marland Kitchen... Take 1 tablet by mouth every morning  Problem # 3:  FIBRILLATION, ATRIAL (ICD-427.31) Assessment: Unchanged I have added metoprolol succinate 50 mg p.o. q.a.m. Once her INR has been therapeutic for 3 weeks, we will plan on outpatient cardioversion. Patient agrees with plan. The following medications were removed from the medication list:    Aspirin 81 Mg Tbec (Aspirin) .Marland Kitchen... Take one tablet by mouth daily Her updated medication list for this problem includes:    Coumadin 5 Mg Tabs (Warfarin sodium) .Marland Kitchen... Take by mouth as directed by coumadin clinic  Orders: Cardioversion (Cardioversion)  Patient Instructions: 1)  Your physician recommends that you schedule a follow-up appointment in: post cardioversion 2)  Your physician has recommended you make the following change in your medication: Stop taking Aspirin and start taking Metoprolol 50 mg by mouth every morning 3)   Your physician has requested that you have a TEE/Cardioversion.  During a TEE, sound waves are used to create images of your heart. It provides your doctor with information about the size and shape of your heart and how well your heart's chambers and valves are working. In this test, a transducer is attached to the end of a flexible tube that is guided down your throat and into your esophagus (the tube leading from your mouth to your stomach) to get a more detailed image of your heart. Once the TEE has determined that a blood clot is not present, the cardioversion begins.  Electrical cardioversion uses a jolt of electricity to your heart either through paddles or wired patches attached to your chest. This is a controlled, usually prescheduled, procedure. This procedure is done at the hospital and you are not awake during the procedure.  You usually go home the day of the procedure. Please see the instruction sheet given to you today for more information. Prescriptions: METOPROLOL SUCCINATE 50 MG XR24H-TAB (METOPROLOL SUCCINATE) take 1 tablet by mouth every morning  #30 x 6   Entered by:   Larita Fife Via LPN   Authorized by:   Gaylord Shih, MD, [facility]  Signed by:   Larita Fife Via LPN on 62/13/0865   Method used:   Electronically to        [facility] (retail)       [address]       [place], [place]  [address]       Ph: [phone]       Fax: [phone]   RxID:   [id number]

## 2010-05-04 NOTE — Miscellaneous (Signed)
  Clinical Lists Changes  Medications: Added new medication of AMIODARONE HCL 200 MG TABS (AMIODARONE HCL) Take one tablet by mouth twice a day - Signed Rx of AMIODARONE HCL 200 MG TABS (AMIODARONE HCL) Take one tablet by mouth twice a day;  #60 x 3;  Signed;  Entered by: [name] RN;  Authorized by: Loreli Slot, MD, [facility];  Method used: Print then Give to Patient    Prescriptions: AMIODARONE HCL 200 MG TABS (AMIODARONE HCL) Take one tablet by mouth twice a day  #60 x 3   Entered by:   [name] RN   Authorized by:   Loreli Slot, MD, [facility]   Signed by:   [name] RN on 07/21/2009   Method used:   Print then Give to Patient   RxID:   [id number]

## 2010-05-04 NOTE — Medication Information (Signed)
Summary: ccr-new-lr  Anticoagulant Therapy  Managed by: Vashti Hey, RN PCP: [name] Supervising MD: Diona Browner MD, Remi Deter Indication 1: Atrial Fibrillation Lab Used: [facility] [place] Site: [place] INR POC 1.7  Dietary changes: no    Health status changes: no    Bleeding/hemorrhagic complications: no    Recent/future hospitalizations: yes       Details: Atrial fib pending DCCV when theraputic 4 weeks  Any changes in medication regimen? yes       Details: started on coumadin 06/02/09  Recent/future dental: no  Any missed doses?: no       Is patient compliant with meds? yes       Allergies: No Known Drug Allergies  Anticoagulation Management History:      The patient comes in today for her initial visit for anticoagulation therapy.  The patient is taking warfarin for atrial fibrillation requiring cardioversion.  Negative risk factors for bleeding include an age less than 26 years old, no history of CVA/TIA, no history of GI bleeding, and absence of serious comorbidities.  The bleeding index is 'low risk'.  Positive CHADS2 values include History of HTN.  Negative CHADS2 values include History of CHF, Age > 41 years old, History of Diabetes, and Prior Stroke/CVA/TIA.  The start date was 06/02/2009.  Anticoagulation responsible provider: Diona Browner MD, Remi Deter.  INR POC: 1.7.  Cuvette Lot#: [id number].    Anticoagulation Management Assessment/Plan:      The patient's current anticoagulation dose is Coumadin 5 mg tabs: Take by mouth as directed by Coumadin clinic.  The target INR is 2.0-3.0.  The next INR is due 06/13/2009.  Anticoagulation instructions were given to patient.  Results were reviewed/authorized by Vashti Hey, RN.  She was notified by Vashti Hey RN.         Current Anticoagulation Instructions: INR 1.7 Increase coumadin to 5mg  once daily except 7.5mg  on Mondays, Wednesdays and Fridays

## 2010-05-04 NOTE — Progress Notes (Signed)
Summary: coumadin management  Phone Note Other Incoming   Caller: Okey Regal RN [facility] Reason for Call: Discuss lab or test results Summary of Call: Called with results of PT/INR obtained on pt today.  PT 24.2  INR 2.0  Order given for pt to continue coumadin 5mg  once daily.  She was started on amiodarone in the hospital.  [facility] to recheck INR on 08/04/09. Initial call taken by: Vashti Hey RN,  July 29, 2009 3:05 PM     Anticoagulant Therapy  Managed by: Vashti Hey, RN PCP: [name] Supervising MD: Diona Browner MD, Remi Deter Indication 1: Atrial Fibrillation Lab Used: [facility] [place] Site: [place] INR POC 2.0  Dietary changes: no    Health status changes: no    Bleeding/hemorrhagic complications: no    Recent/future hospitalizations: yes       Details: s/p [facility]  Any changes in medication regimen? no    Recent/future dental: no  Any missed doses?: no       Is patient compliant with meds? yes         Anticoagulation Management History:      The patient is taking warfarin and comes in today for a routine follow up visit.  The patient is taking warfarin for atrial fibrillation requiring cardioversion.  Negative risk factors for bleeding include an age less than 4 years old, no history of CVA/TIA, no history of GI bleeding, and absence of serious comorbidities.  The bleeding index is 'low risk'.  Positive CHADS2 values include History of HTN.  Negative CHADS2 values include History of CHF, Age > 69 years old, History of Diabetes, and Prior Stroke/CVA/TIA.  The start date was 06/02/2009.  Her last INR was 1.7.  Anticoagulation responsible provider: Diona Browner MD, Remi Deter.  INR POC: 2.0.  Cuvette Lot#: [id number].    Anticoagulation Management Assessment/Plan:      The patient's current anticoagulation dose is Coumadin 5 mg tabs: Take by mouth as directed by Coumadin clinic.  The target INR is 2.0-3.0.  The next INR is due 08/04/2009.  Anticoagulation instructions were  given to patient.  Results were reviewed/authorized by Vashti Hey, RN.  She was notified by Vashti Hey RN.         Prior Anticoagulation Instructions: Received fax from [place] with results of PT/INR obtained on pt yesterday.  PT  21.0  INR 1.7  Pt was admitted to the services of [facility] yesterday.  Pt was just d/c from hospital and was started on amiodarone 200mg  two times a day.  Told pt to take coumadin 7.5mg  tonight then resume 5mg  once daily and [facility] to recheck INR on 08/28/09.  Pt was sent home from the hospital on comadin 5mg  once daily.  Current Anticoagulation Instructions: Called with results of PT/INR obtained on pt today.  PT 24.2  INR 2.0  Order given for pt to continue coumadin 5mg  once daily.  She was started on amiodarone in the hospital.  [facility] to recheck INR on 08/04/09.

## 2010-05-04 NOTE — Letter (Signed)
Summary: History form  History form   Imported By: Jacklynn Ganong 03/02/2010 09:56:35  _____________________________________________________________________  External Attachment:    Type:   Image     Comment:   External Document

## 2010-05-04 NOTE — Medication Information (Signed)
Summary: ccr-lr  Anticoagulant Therapy  Managed by: Vashti Hey, RN PCP: [name] Supervising MD: [name] MD, [name] Indication 1: Atrial Fibrillation Lab Used: [facility] [place] Site: [place] INR POC 3.0  Dietary changes: no    Health status changes: no    Bleeding/hemorrhagic complications: no    Recent/future hospitalizations: no    Any changes in medication regimen? no    Recent/future dental: no  Any missed doses?: no       Is patient compliant with meds? yes       Allergies: No Known Drug Allergies  Anticoagulation Management History:      The patient is taking warfarin and comes in today for a routine follow up visit.  The patient is taking warfarin for atrial fibrillation requiring cardioversion.  Negative risk factors for bleeding include an age less than 28 years old, no history of CVA/TIA, no history of GI bleeding, and absence of serious comorbidities.  The bleeding index is 'low risk'.  Positive CHADS2 values include History of HTN.  Negative CHADS2 values include History of CHF, Age > 31 years old, History of Diabetes, and Prior Stroke/CVA/TIA.  The start date was 06/02/2009.  Anticoagulation responsible provider: [name] MD, [name].  INR POC: 3.0.  Cuvette Lot#: [id number].    Anticoagulation Management Assessment/Plan:      The patient's current anticoagulation dose is Coumadin 5 mg tabs: Take by mouth as directed by Coumadin clinic.  The target INR is 2.0-3.0.  The next INR is due 07/18/2009.  Anticoagulation instructions were given to patient.  Results were reviewed/authorized by Vashti Hey, RN.  She was notified by Vashti Hey RN.        Coagulation management information includes: Pending DCCV when theraputic 4 weeks  Appt with Dr Daleen Squibb 06/15/09   Pending DCCV .  Prior Anticoagulation Instructions: INR 2.9 Continue coumadin 7.5mg  once daily except 5mg  on Sundays  Current Anticoagulation Instructions: INR 3.0 Continue coumadin  7.5mg  once daily except 5mg  on Sundays

## 2010-05-04 NOTE — Letter (Signed)
Summary: *Consult Note  [facility]  [address]  [place], [place] [address]   Phone: [phone]  Fax: [phone]    Re:    Jasmin Martin DOB:    02-08-1956   Dear: Renae Fickle   Thank you for requesting that we see the above patient for consultation.  A copy of the detailed office note will be sent under separate cover, for your review.  Evaluation today is consistent with:  1)  STRESS FRACTURE, FOOT (ICD-733.94) 2)  TENOSYNOVITIS OF FOOT AND ANKLE (ICD-727.06)   Our recommendation is for: CAM WALKER AND CANE    New Orders include:  1)  Consultation Level III [01027]   New Medications started today include: NONE   After today's visit, the patients current medications include: 1)  ALPRAZOLAM 1 MG TABS (ALPRAZOLAM) as needed 2)  COUMADIN 5 MG TABS (WARFARIN SODIUM) 7.5mg  once daily except 5mg  on Mondays, Wednesdays and Fridays 3)  OXYCODONE-ACETAMINOPHEN 5-325 MG TABS (OXYCODONE-ACETAMINOPHEN) Take 1 tablet by mouth four times a day as needed 4)  FUROSEMIDE 40 MG TABS (FUROSEMIDE) Take one tablet by mouth daily. 5)  ASPIR-LOW 81 MG TBEC (ASPIRIN) take 1 tab daily 6)  LISINOPRIL 20 MG TABS (LISINOPRIL) take 1 tablet by mouth once daily 7)  DILTIAZEM HCL ER BEADS 240 MG XR24H-CAP (DILTIAZEM HCL ER BEADS) Take one capsule by mouth daily   Thank you for this consultation.  If you have any further questions regarding the care of this patient, please do not hesitate to contact me @ [phone]  Thank you for this opportunity to look after your patient.  Sincerely,   Fuller Canada MD

## 2010-05-04 NOTE — Progress Notes (Signed)
Summary: coumadin management  Phone Note Outgoing Call   Caller: Vashti Hey RN  Call placed by: Vashti Hey RN  Call placed to: Patient Reason for Call: Discuss lab or test results Summary of Call: Received fax from [facility] with results of PT/INR obtained on pt yesterday.  PT  21.0  INR 1.7  Pt was admitted to the services of [facility] yesterday.  Pt was just d/c from hospital and was started on amiodarone 200mg  two times a day.  Told pt to take coumadin 7.5mg  tonight then resume 5mg  once daily and [facility] to recheck INR on 08/28/09.  Pt was sent home from the hospital on comadin 5mg  once daily.     Anticoagulant Therapy  Managed by: Vashti Hey, RN PCP: [name] Supervising MD: Andee Lineman MD, Michelle Piper Indication 1: Atrial Fibrillation Lab Used: [facility] [place] Site: [place] PT 21.0  Dietary changes: no    Health status changes: no    Bleeding/hemorrhagic complications: no    Recent/future hospitalizations: yes       Details: was d/c from [facility] with recurrent A Fib post DCCV/SOB  Any changes in medication regimen? yes       Details: started o Amiodarone 200mg  BID   Recent/future dental: no  Any missed doses?: yes     Details: pt was told to take coumadin 5mg  qd when d/c from hospital  Is patient compliant with meds? yes         Anticoagulation Management History:      Her anticoagulation is being managed by telephone today.  The patient is taking warfarin for atrial fibrillation requiring cardioversion.  Negative risk factors for bleeding include an age less than 71 years old, no history of CVA/TIA, no history of GI bleeding, and absence of serious comorbidities.  The bleeding index is 'low risk'.  Positive CHADS2 values include History of HTN.  Negative CHADS2 values include History of CHF, Age > 14 years old, History of Diabetes, and Prior Stroke/CVA/TIA.  The start date was 06/02/2009.  Today's INR is 1.7.  Prothrombin time is 21.0.  Anticoagulation responsible  provider: Andee Lineman MD, Michelle Piper.    Anticoagulation Management Assessment/Plan:      The patient's current anticoagulation dose is Coumadin 5 mg tabs: Take by mouth as directed by Coumadin clinic.  The target INR is 2.0-3.0.  The next INR is due 07/29/2009.  Anticoagulation instructions were given to patient.  Results were reviewed/authorized by Vashti Hey, RN.  She was notified by Vashti Hey RN.        Coagulation management information includes: S/P DCCV  Had break thru A Fib.  07/24/09 Started on Amiodarone 200mg  two times a day .  Prior Anticoagulation Instructions: INR 3.2 Continue coumadin 7.5mg  once daily except 5mg  on Sundays  Current Anticoagulation Instructions: Received fax from [place] with results of PT/INR obtained on pt yesterday.  PT  21.0  INR 1.7  Pt was admitted to the services of [facility] yesterday.  Pt was just d/c from hospital and was started on amiodarone 200mg  two times a day.  Told pt to take coumadin 7.5mg  tonight then resume 5mg  once daily and [facility] to recheck INR on 08/28/09.  Pt was sent home from the hospital on comadin 5mg  once daily.

## 2010-05-04 NOTE — Medication Information (Signed)
Summary: ccr-lr  Anticoagulant Therapy  Managed by: Vashti Hey, RN PCP: [name] Supervising MD: [name] MD, [name] Indication 1: Atrial Fibrillation Lab Used: [facility] [place] Site: [place] INR POC 2.2  Dietary changes: no    Health status changes: no    Bleeding/hemorrhagic complications: no    Recent/future hospitalizations: no    Any changes in medication regimen? no    Recent/future dental: yes     Details: had 1 tooth pulled on coumadin  Any missed doses?: no       Is patient compliant with meds? yes       Allergies: No Known Drug Allergies  Anticoagulation Management History:      The patient is taking warfarin and comes in today for a routine follow up visit.  The patient is taking warfarin for atrial fibrillation requiring cardioversion.  Negative risk factors for bleeding include an age less than 74 years old, no history of CVA/TIA, no history of GI bleeding, and absence of serious comorbidities.  The bleeding index is 'low risk'.  Positive CHADS2 values include History of HTN.  Negative CHADS2 values include History of CHF, Age > 74 years old, History of Diabetes, and Prior Stroke/CVA/TIA.  The start date was 06/02/2009.  Anticoagulation responsible provider: [name] MD, [name].  INR POC: 2.2.  Cuvette Lot#: [id number].    Anticoagulation Management Assessment/Plan:      The patient's current anticoagulation dose is Coumadin 5 mg tabs: Take by mouth as directed by Coumadin clinic.  The target INR is 2.0-3.0.  The next INR is due 06/20/2009.  Anticoagulation instructions were given to patient.  Results were reviewed/authorized by Vashti Hey, RN.  She was notified by Vashti Hey RN.        Coagulation management information includes: Pending DCCV when theraputic 4 weeks  Appt with Dr Daleen Squibb 06/15/09.  Prior Anticoagulation Instructions: INR 1.7 Increase coumadin to 5mg  once daily except 7.5mg  on Mondays, Wednesdays and Fridays  Current  Anticoagulation Instructions: INR 2.2 Continue coumadin 5mg  once daily except 7.5mg  on Mondays, Wednesdays and Fridays

## 2010-05-04 NOTE — Medication Information (Signed)
Summary: ccr-lr  Anticoagulant Therapy  Managed by: Vashti Hey, RN PCP: [name] Supervising MD: [name] MD, [name] Indication 1: Atrial Fibrillation Lab Used: [facility] [place] Site: [place] INR POC 2.5  Dietary changes: no    Health status changes: no    Bleeding/hemorrhagic complications: no    Recent/future hospitalizations: no    Any changes in medication regimen? no    Recent/future dental: no  Any missed doses?: no       Is patient compliant with meds? yes       Allergies: No Known Drug Allergies  Anticoagulation Management History:      The patient is taking warfarin and comes in today for a routine follow up visit.  She is being anticoagulated because of atrial fibrillation requiring cardioversion.  Negative risk factors for bleeding include an age less than 39 years old, no history of CVA/TIA, no history of GI bleeding, and absence of serious comorbidities.  The bleeding index is 'low risk'.  Positive CHADS2 values include History of HTN.  Negative CHADS2 values include History of CHF, Age > 53 years old, History of Diabetes, and Prior Stroke/CVA/TIA.  The start date was 06/02/2009.  Her last INR was 2.1 ratio.  Anticoagulation responsible Falisa Lamora: [name] MD, [name].  INR POC: 2.5.  Exp: 12/2010.    Anticoagulation Management Assessment/Plan:      The patient's current anticoagulation dose is Coumadin 5 mg tabs: 7.5mg  once daily except 5mg  on Mondays, Wednesdays and Fridays.  The target INR is 2.0-3.0.  The next INR is due 02/02/2010.  Anticoagulation instructions were given to patient.  Results were reviewed/authorized by Vashti Hey, RN.  She was notified by Vashti Hey RN.         Prior Anticoagulation Instructions: INR 1.2 Take coumadin 2 1/2 tablets tonight and tomorrow night then resume 7.5mg  once daily except 5mg  on Mondays, Wednesdays and Fridays Denies missing any doses  Current Anticoagulation Instructions: INR 2.5 Continue  coumadin 7.5mg  once daily except 5mg  on Mondays, Wednesdays and Fridays

## 2010-05-04 NOTE — Progress Notes (Signed)
Summary: patient question about foot hurting more  Phone Note Call from Patient   Caller: Patient Summary of Call: patient called to relay that her foot and ankle has been stinging and burning in brace for 4 days.  Please advise if recommendation.  Her next scheduled appt is 04/25/10.  Ph [phone] Initial call taken by: Cammie Sickle,  April 19, 2010 2:42 PM  Follow-up for Phone Call        take brace off, come in next available, no fracture Follow-up by: Ether Griffins,  April 19, 2010 3:29 PM  Additional Follow-up for Phone Call Additional follow up Details #1::        called, advised patient. Additional Follow-up by: Cammie Sickle,  April 19, 2010 5:52 PM

## 2010-05-04 NOTE — Medication Information (Signed)
Summary: ccr-lr  Anticoagulant Therapy  Managed by: Vashti Hey, RN PCP: [name] Supervising MD: [name] MD, [name] Indication 1: Atrial Fibrillation Lab Used: [facility] [place] Site: [place] INR POC 1.7           Allergies: No Known Drug Allergies  Anticoagulation Management History:      The patient is taking warfarin and comes in today for a routine follow up visit.  The patient is taking warfarin for atrial fibrillation requiring cardioversion.  Negative risk factors for bleeding include an age less than 1 years old, no history of CVA/TIA, no history of GI bleeding, and absence of serious comorbidities.  The bleeding index is 'low risk'.  Positive CHADS2 values include History of HTN.  Negative CHADS2 values include History of CHF, Age > 10 years old, History of Diabetes, and Prior Stroke/CVA/TIA.  The start date was 06/02/2009.  Anticoagulation responsible provider: [name] MD, [name].  INR POC: 1.7.  Cuvette Lot#: [id number].    Anticoagulation Management Assessment/Plan:      The patient's current anticoagulation dose is Coumadin 5 mg tabs: Take by mouth as directed by Coumadin clinic.  The target INR is 2.0-3.0.  The next INR is due 06/27/2009.  Anticoagulation instructions were given to patient.  Results were reviewed/authorized by Vashti Hey, RN.  She was notified by Vashti Hey RN.        Coagulation management information includes: Pending DCCV when theraputic 4 weeks  Appt with Dr Daleen Squibb 06/15/09   Pending DCCV 07/07/09.  Prior Anticoagulation Instructions: INR 2.2 Continue coumadin 5mg  once daily except 7.5mg  on Mondays, Wednesdays and Fridays  Current Anticoagulation Instructions: INR 1.7 Increase coumadin to 7.5mg  once daily except 5mg  on Sundays

## 2010-05-04 NOTE — Medication Information (Signed)
Summary: ccr-lr  Anticoagulant Therapy  Managed by: Vashti Hey, RN PCP: [name] Supervising MD: [name] MD, [name] Indication 1: Atrial Fibrillation Lab Used: [facility] [place] Site: [place] INR POC 2.8  Dietary changes: no    Health status changes: no    Bleeding/hemorrhagic complications: no    Recent/future hospitalizations: no    Any changes in medication regimen? no    Recent/future dental: no  Any missed doses?: no       Is patient compliant with meds? yes       Allergies: No Known Drug Allergies  Anticoagulation Management History:      The patient is taking warfarin and comes in today for a routine follow up visit.  She is being anticoagulated due to atrial fibrillation requiring cardioversion.  Negative risk factors for bleeding include an age less than 41 years old, no history of CVA/TIA, no history of GI bleeding, and absence of serious comorbidities.  The bleeding index is 'low risk'.  Positive CHADS2 values include History of HTN.  Negative CHADS2 values include History of CHF, Age > 3 years old, History of Diabetes, and Prior Stroke/CVA/TIA.  The start date was 06/02/2009.  Her last INR was 2.1 ratio.  Anticoagulation responsible provider: [name] MD, [name].  INR POC: 2.8.  Cuvette Lot#: [id number].  Exp: 12/2010.    Anticoagulation Management Assessment/Plan:      The patient's current anticoagulation dose is Coumadin 5 mg tabs: Take by mouth as directed by Coumadin clinic.  The target INR is 2.0-3.0.  The next INR is due 01/05/2010.  Anticoagulation instructions were given to patient.  Results were reviewed/authorized by Vashti Hey, RN.  She was notified by Vashti Hey RN.         Prior Anticoagulation Instructions: INR 2.0 Increase coumadin to 7.5mg  once daily except 5mg  on Mondays, Wednesdays and Fridays  Current Anticoagulation Instructions: INR 2.8 Continue coumadin 7.5mg  once daily except 5mg  on Mondays, Wednesdays and  Fridays

## 2010-05-04 NOTE — Medication Information (Signed)
Summary: ccr-lr  Anticoagulant Therapy  Managed by: Vashti Hey, RN PCP: [name] Supervising MD: [name] MD, [name] Indication 1: Atrial Fibrillation Lab Used: [facility] [place] Site: [place] INR POC 2.7  Dietary changes: no    Health status changes: no    Bleeding/hemorrhagic complications: no    Recent/future hospitalizations: no    Any changes in medication regimen? no    Recent/future dental: no  Any missed doses?: no       Is patient compliant with meds? yes       Allergies: No Known Drug Allergies  Anticoagulation Management History:      The patient is taking warfarin and comes in today for a routine follow up visit.  The patient is taking warfarin for atrial fibrillation requiring cardioversion.  Negative risk factors for bleeding include an age less than 49 years old, no history of CVA/TIA, no history of GI bleeding, and absence of serious comorbidities.  The bleeding index is 'low risk'.  Positive CHADS2 values include History of HTN.  Negative CHADS2 values include History of CHF, Age > 65 years old, History of Diabetes, and Prior Stroke/CVA/TIA.  The start date was 06/02/2009.  Anticoagulation responsible provider: [name] MD, [name].  INR POC: 2.7.  Cuvette Lot#: [id number].    Anticoagulation Management Assessment/Plan:      The patient's current anticoagulation dose is Coumadin 5 mg tabs: Take by mouth as directed by Coumadin clinic.  The target INR is 2.0-3.0.  The next INR is due 07/04/2009.  Anticoagulation instructions were given to patient.  Results were reviewed/authorized by Vashti Hey, RN.  She was notified by Vashti Hey RN.         Prior Anticoagulation Instructions: INR 1.7 Increase coumadin to 7.5mg  once daily except 5mg  on Sundays  Current Anticoagulation Instructions: INR 2.7 Continue coumadin 7.5mg  once daily except 5mg  on Sunday

## 2010-05-04 NOTE — Assessment & Plan Note (Signed)
Summary: f5w   Visit Type:  follow-up Referring Provider:  [name], MD Primary Provider:  [name]   History of Present Illness: The patient presents today for routine electrophysiology followup. She reports ongoing fatigue and decreased exercise tolerance. The patient denies symptoms of chest pain, shortness of breath, orthopnea, PND, lower extremity edema, dizziness, presyncope, syncope, or neurologic sequela.  She feels occasional "heart racing" with activity. The patient is tolerating medications without difficulties and is otherwise without complaint today.   Current Medications (verified): 1)  Alprazolam 1 Mg Tabs (Alprazolam) .... As Needed 2)  Coumadin 5 Mg Tabs (Warfarin Sodium) .... Take By Mouth As Directed By Coumadin Clinic 3)  Oxycodone-Acetaminophen 5-325 Mg Tabs (Oxycodone-Acetaminophen) .... Take 1 Tablet By Mouth Four Times A Day As Needed 4)  Furosemide 40 Mg Tabs (Furosemide) .... Take One Tablet By Mouth Daily. 5)  Aspir-Low 81 Mg Tbec (Aspirin) .... Take 1 Tab Daily 6)  Lisinopril 20 Mg Tabs (Lisinopril) .... Take 1 Tablet By Mouth Once Daily 7)  Diltiazem Hcl Er Beads 240 Mg Xr24h-Cap (Diltiazem Hcl Er Beads) .... Take One Capsule By Mouth Daily  Allergies (verified): No Known Drug Allergies  Past History:  Past Medical History: Reviewed history from 08/24/2009 and no changes required. Persistent atrial fibrillation Morbid Obesity HTN DJD denies h/o rheumatic fever  denies h/o diabetes, CVA  Past Surgical History: Reviewed history from 08/24/2009 and no changes required. bilateral knee surgery hysterectomy tonsillectomy right ankle surgery cardiac cath 1990s, normal per patient Cholecystectomy 2001  Social History: Reviewed history from 08/24/2009 and no changes required. separated and lives in [place] Tobacco Use - No.  Alcohol Use - no Regular Exercise - no Drug Use - no  Review of Systems       All systems are reviewed and  negative except as listed in the HPI.   Vital Signs:  Patient profile:   55 year old female Height:      64 inches Weight:      330 pounds BMI:     56.85 O2 Sat:      98 % Pulse rate:   78 / minute BP sitting:   106 / 86  (left arm)  Vitals Entered By: Laurance Flatten CMA (December 15, 2009 9:23 AM)  Physical Exam  General:  obese. no acute distress Head:  normocephalic and atraumatic Eyes:  PERRLA/EOM intact; conjunctiva and lids normal. Mouth:  Teeth, gums and palate normal. Oral mucosa normal. Neck:  Neck supple, no JVD. No masses, thyromegaly or abnormal cervical nodes. Lungs:  Clear bilaterally to auscultation and percussion. Heart:  PMI poorly appreciated, irregular rate and rhythm, variable S1-S2 Abdomen:  Bowel sounds positive; abdomen soft and non-tender without masses, organomegaly, or hernias noted. No hepatosplenomegaly. Msk:  Back normal, normal gait. Muscle strength and tone normal. Pulses:  pulses normal in all 4 extremities Extremities:  No clubbing or cyanosis. Neurologic:  Alert and oriented x 3.   Impression & Recommendations:  Problem # 1:  FIBRILLATION, ATRIAL (ICD-427.31) The patient has symptomatic persistent atrial fibrillation.  She has failed medical therapy with amiodarone and tikosyn. Given her morbid obesity, our options are presently limited. I spoke with Dr Juanetta Gosling today, who is contemplating referral for gastric bypass surgery.  I think that this would be a very good idea.  I think that longterm, her mortality and quality of life would be best improved with weight loss.  She has failed attempts at weight loss previously.  We will  continue our present strategy of rate control and anticoagulation for afib. No changes were made today.  Problem # 2:  OBESITY-MORBID (>100') (ICD-278.01) as above  she is being referred by Dr Juanetta Gosling for bariatric surgery. I would recommend that she proceed to surgery without further cardiac evaluation if she meets  criteria for surgery.  Patient Instructions: 1)  Your physician recommends that you schedule a follow-up appointment in: 3 months with Dr Johney Frame 2)  Follow up with Dr Juanetta Gosling for gastric bypass surgery

## 2010-05-04 NOTE — Medication Information (Signed)
Summary: POST HOSP PROTIME/TG  Anticoagulant Therapy  Managed by: Vashti Hey, RN PCP: [name] Supervising MD: [name] MD, [name] Indication 1: Atrial Fibrillation Lab Used: [facility] [place] Site:  INR POC 2.5  Dietary changes: no    Health status changes: no    Bleeding/hemorrhagic complications: no    Recent/future hospitalizations: no    Any changes in medication regimen? yes       Details: been in [facility]  Failed tikosyn  DCCV cancelled   Appt with Dr Johney Frame 8/12 to discuss ablation  Recent/future dental: no  Any missed doses?: no       Is patient compliant with meds? yes       Allergies: No Known Drug Allergies  Anticoagulation Management History:      The patient is taking warfarin and comes in today for a routine follow up visit.  She is being anticoagulated due to atrial fibrillation requiring cardioversion.  Negative risk factors for bleeding include an age less than 10 years old, no history of CVA/TIA, no history of GI bleeding, and absence of serious comorbidities.  The bleeding index is 'low risk'.  Positive CHADS2 values include History of HTN.  Negative CHADS2 values include History of CHF, Age > 13 years old, History of Diabetes, and Prior Stroke/CVA/TIA.  The start date was 06/02/2009.  Her last INR was 2.1 ratio.  Anticoagulation responsible provider: [name] MD, [name].  INR POC: 2.5.  Cuvette Lot#: [id number].  Exp: 12/2010.    Anticoagulation Management Assessment/Plan:      The patient's current anticoagulation dose is Coumadin 5 mg tabs: Take by mouth as directed by Coumadin clinic.  The target INR is 2.0-3.0.  The next INR is due 11/02/2009.  Anticoagulation instructions were given to patient.  Results were reviewed/authorized by Vashti Hey, RN.  She was notified by Vashti Hey RN.        Coagulation management information includes: Appt 11/11/09 with Dr Johney Frame to discuss ablation.  Prior Anticoagulation Instructions: INR  2.6 CONTINUE CURRENT DOSE 1.5 TABLETS ON SUNDAYS AND THURSDAYS AND 1 TABLET ALL OTHER DAYS  Current Anticoagulation Instructions: INR 2.5 Continue comadin 5mg  once daily except 7.5mg  on Sundays and Thursdays

## 2010-05-04 NOTE — Medication Information (Signed)
Summary: 4 wk protime per checkout on 10/18/09/tg  Anticoagulant Therapy  Managed by: Vashti Hey, RN PCP: [name] Supervising MD: [name] MD, [name] Indication 1: Atrial Fibrillation Lab Used: [facility] [place] Site: [place] INR POC 2.0  Dietary changes: no    Health status changes: no    Bleeding/hemorrhagic complications: no    Recent/future hospitalizations: no    Any changes in medication regimen? no    Recent/future dental: no  Any missed doses?: no       Is patient compliant with meds? yes       Allergies: No Known Drug Allergies  Anticoagulation Management History:      The patient is taking warfarin and comes in today for a routine follow up visit.  She is being anticoagulated due to atrial fibrillation requiring cardioversion.  Negative risk factors for bleeding include an age less than 13 years old, no history of CVA/TIA, no history of GI bleeding, and absence of serious comorbidities.  The bleeding index is 'low risk'.  Positive CHADS2 values include History of HTN.  Negative CHADS2 values include History of CHF, Age > 17 years old, History of Diabetes, and Prior Stroke/CVA/TIA.  The start date was 06/02/2009.  Her last INR was 2.1 ratio.  Anticoagulation responsible provider: [name] MD, [name].  INR POC: 2.0.  Cuvette Lot#: [id number].  Exp: 12/2010.    Anticoagulation Management Assessment/Plan:      The patient's current anticoagulation dose is Coumadin 5 mg tabs: Take by mouth as directed by Coumadin clinic.  The target INR is 2.0-3.0.  The next INR is due 12/08/2009.  Anticoagulation instructions were given to patient.  Results were reviewed/authorized by Vashti Hey, RN.  She was notified by Vashti Hey RN.        Coagulation management information includes: Appt 11/11/09 with Dr Johney Frame to discuss ablation   Needs to loose 21 lbs before scheduling ablation.  Prior Anticoagulation Instructions: INR 2.0 Take coumadin 7.5mg  tonight then  increase dose to 5mg  once daily except 7.5mg  on S,T,Th  Current Anticoagulation Instructions: INR 2.0 Increase coumadin to 7.5mg  once daily except 5mg  on Mondays, Wednesdays and Fridays

## 2010-05-04 NOTE — Medication Information (Signed)
Summary: ccr-lr  Anticoagulant Therapy  Managed by: Vashti Hey, RN PCP: [name] Supervising MD: Eden Emms MD, Theron Arista Indication 1: Atrial Fibrillation Lab Used: [facility] [place] Site: [place] INR POC 3.2  Dietary changes: no    Health status changes: no    Bleeding/hemorrhagic complications: no    Recent/future hospitalizations: yes       Details: s/p DCCV  Any changes in medication regimen? no    Recent/future dental: no  Any missed doses?: no       Is patient compliant with meds? yes       Allergies: No Known Drug Allergies  Anticoagulation Management History:      The patient is taking warfarin and comes in today for a routine follow up visit.  The patient is taking warfarin for atrial fibrillation requiring cardioversion.  Negative risk factors for bleeding include an age less than 110 years old, no history of CVA/TIA, no history of GI bleeding, and absence of serious comorbidities.  The bleeding index is 'low risk'.  Positive CHADS2 values include History of HTN.  Negative CHADS2 values include History of CHF, Age > 46 years old, History of Diabetes, and Prior Stroke/CVA/TIA.  The start date was 06/02/2009.  Anticoagulation responsible provider: Eden Emms MD, Theron Arista.  INR POC: 3.2.  Cuvette Lot#: [id number].    Anticoagulation Management Assessment/Plan:      The patient's current anticoagulation dose is Coumadin 5 mg tabs: Take by mouth as directed by Coumadin clinic.  The target INR is 2.0-3.0.  The next INR is due 07/27/2009.  Anticoagulation instructions were given to patient.  Results were reviewed/authorized by Vashti Hey, RN.  She was notified by Vashti Hey RN.         Prior Anticoagulation Instructions: INR 3.0 Continue coumadin 7.5mg  once daily except 5mg  on Sundays  Current Anticoagulation Instructions: INR 3.2 Continue coumadin 7.5mg  once daily except 5mg  on Sundays

## 2010-05-04 NOTE — Progress Notes (Signed)
Summary: coumadin management  Phone Note Other Incoming   Caller: Ernesto Rutherford RN [facility] Reason for Call: Discuss lab or test results Summary of Call: Called with results of PT/INR obtained on pt today.  PT 33.6  INR 2.8  Order given for pt to continue coumadin 5mg  once daily except 7.5mg  on Sundays and Thursdays and [facility] to recheck INR on 09/07/09. Initial call taken by: Vashti Hey RN,  August 31, 2009 12:31 PM     Anticoagulant Therapy  Managed by: Vashti Hey RN PCP: [name] Supervising MD: [name] MD, [name] Indication 1: Atrial Fibrillation Lab Used: [facility] [place] Site: [place] PT 33.6 INR POC 2.8  Dietary changes: no    Health status changes: no    Bleeding/hemorrhagic complications: no    Recent/future hospitalizations: yes       Details: scheduled for hospital admission to be started on flecanide  Any changes in medication regimen? no    Recent/future dental: no  Any missed doses?: no       Is patient compliant with meds? yes         Anticoagulation Management History:      Her anticoagulation is being managed by telephone today.  The patient is on warfarin for atrial fibrillation requiring cardioversion.  Negative risk factors for bleeding include an age less than 56 years old, no history of CVA/TIA, no history of GI bleeding, and absence of serious comorbidities.  The bleeding index is 'low risk'.  Positive CHADS2 values include History of HTN.  Negative CHADS2 values include History of CHF, Age > 7 years old, History of Diabetes, and Prior Stroke/CVA/TIA.  The start date was 06/02/2009.  Her last INR was 2.1 ratio.  Prothrombin time is 33.6.  Anticoagulation responsible provider: [name] MD, [name].  INR POC: 2.8.    Anticoagulation Management Assessment/Plan:      The patient's current anticoagulation dose is Coumadin 5 mg tabs: Take by mouth as directed by Coumadin clinic.  The target INR is 2.0-3.0.  The next INR is due  09/07/2009.  Anticoagulation instructions were given to patient.  Results were reviewed/authorized by Vashti Hey RN.  She was notified by Vashti Hey RN.         Prior Anticoagulation Instructions: Called with results of PT/INR obtained on pt today.  PT 25.0  INR 2.1  Order given for pt to continue coumadin 5mg  once daily except 7.5mg  on Sundays and Thursdays and [facility] to recheck INR week of 08/29/09.  Current Anticoagulation Instructions: Called with results of PT/INR obtained on pt today.  PT 33.6  INR 2.8  Order given for pt to continue coumadin 5mg  once daily except 7.5mg  on Sundays and Thursdays and [facility] to recheck INR on 09/07/09.

## 2010-05-04 NOTE — Letter (Signed)
Summary: Appointment - Missed  [facility] [place]  [address] [place] [address]   Phone: [phone]  Fax: [phone]     February 06, 2010 MRN: [id number]   [name] [address] [place], [place]  [address]   Dear [name],  Our records indicate you missed your appointment on       02/06/10 COUMADIN CLINIC             It is very important that we reach you to reschedule this appointment. We look forward to participating in your health care needs. Please contact us at the number listed above at your earliest convenience to reschedule this appointment.     Sincerely,    Glass blower/designer

## 2010-05-04 NOTE — Assessment & Plan Note (Signed)
Summary: nep/ ablation/ pt has medicade., Nitro access/ gdp   Visit Type:  Initial Consult Referring Provider:  [name], MD Primary Provider:  [name]  CC:  afib.  History of Present Illness: [name] is a pleasant 55 yo WF with a h/o morbid obesity and persistent atrial fibrillation.  She reports initially being diagnosed with atrial fibrillation 2/11.  She suspects that she may have actually had afib much longer.  She presented with tachypalpitations and SOB to [facility] and was found to have afib with RVR.  She was hospitalized and cardioverted and initiated on amiodarone and metoprolol.  She reports returning to afib within several days of cardioversion.   She reports feeling even more dyspneic upon cardioversion.  She denies recent episodes of chest pain, presyncope, or syncope.  She reports DOE, SOB at rest, orthopnea, and fatigue.  She reports dypsnea with minimal activity.    Allergies: No Known Drug Allergies  Past History:  Past Medical History: Persistent atrial fibrillation Morbid Obesity HTN DJD denies h/o rheumatic fever  denies h/o diabetes, CVA  Past Surgical History: bilateral knee surgery hysterectomy tonsillectomy right ankle surgery cardiac cath 1990s, normal per patient Cholecystectomy 2001  Family History: Reviewed history from 05/31/2009 and no changes required. Father:deceased age 53 lung cancer Mother:deceased (murdered) age 19  Social History: separated and lives in [place] Tobacco Use - No.  Alcohol Use - no Regular Exercise - no Drug Use - no  Review of Systems       All systems are reviewed and negative except as listed in the HPI.   Vital Signs:  Patient profile:   55 year old female Height:      64 inches Weight:      321 pounds BMI:     55.30 BP sitting:   118 / 86  (left arm)  Vitals Entered By: Laurance Flatten CMA (Aug 24, 2009 2:41 PM)  Physical Exam  General:  morbid obesity Head:  normocephalic  and atraumatic Eyes:  PERRLA/EOM intact; conjunctiva and lids normal. Nose:  no deformity, discharge, inflammation, or lesions Mouth:  Teeth, gums and palate normal. Oral mucosa normal. Neck:  Neck supple, no JVD. No masses, thyromegaly or abnormal cervical nodes. Lungs:  Clear bilaterally to auscultation and percussion. Heart:  iRRR, no m/r/g Abdomen:  Bowel sounds positive; abdomen soft and non-tender without masses, organomegaly, or hernias noted. No hepatosplenomegaly. Msk:  Back normal, normal gait. Muscle strength and tone normal. Pulses:  pulses normal in all 4 extremities Extremities:  No clubbing or cyanosis. Neurologic:  Alert and oriented x 3. Skin:  Intact without lesions or rashes. Cervical Nodes:  no significant adenopathy Psych:  Normal affect.   Echocardiogram  Procedure date:  07/19/2009  Findings:        --------------------------------------------------------------------   Study Conclusions    - Left ventricle: The cavity size was normal. Wall thickness was     increased in a pattern of moderate LVH. Systolic function was     normal. The estimated ejection fraction was in the range of 60% to     65%. Wall motion was normal; there were no regional wall motion     abnormalities.   - Aortic valve: Mildly calcified annulus. Probably trileaflet;     mildly calcified leaflets. No significant regurgitation.   - Mitral valve: Calcified annulus. Trivial regurgitation.   - Left atrium: The atrium was mildly dilated.   - Tricuspid valve: Trivial regurgitation.   - Pericardium,  extracardiac: There was no pericardial effusion.   Transthoracic echocardiography. M-mode, complete 2D, spectral   Doppler, and color Doppler. Height: Height: 160cm. Height: 63in.   Weight: Weight: 134.1kg. Weight: 294.9lb. Body mass index: BMI:   52.4kg/m^2. Body surface area: BSA: 2.50m^2. Patient status:   Inpatient.      EKG  Procedure date:  08/24/2009  Findings:      afib,  V  rates 50s,  poor R wave progression, Qtc 426  Impression & Recommendations:  Problem # 1:  FIBRILLATION, ATRIAL (ICD-427.31) [name] is a pleasant 55 yo WF with a h/o sypmtomatic persistent atrial fibrillation.  She has failed medical therapy with amiodarone.  Therapeutic strategies for afib including medicine and ablation were discussed in detail with the patient today. Given her morbid obesity, her atrial fibrillation will be difficult to treat.  She is not presently a candidate for catheter ablation.   We will obtain a sleep study to evaluate for sleep apnea as a cause for her afib.  Once this has been obtained, we will plan to admit the patient for a tikosyn load, after 4 weeks for therapeutic INRs.  She should continue coumadin (goal INR 2-3) in the interim.  Problem # 2:  OBESITY-MORBID (>100') (ICD-278.01) The patient's size is "life threateningly" large.  The importance of weight loss was discussed at length today.  I have recommended referral for bariatric surgery, however she declines at this time.  She should continue to work at weight loss. We will refer her for sleep study to evaluate for sleep apnea.  Problem # 3:  HYPERTENSION, BENIGN (ICD-401.1) stable, no changes  Other Orders: TLB-BMP (Basic Metabolic Panel-BMET) (80048-METABOL) TLB-PT (Protime) (85610-PTP) Sleep Disorder Referral (Sleep Disorder)  Patient Instructions: 1)  Your physician recommends that you return for lab work today 2)  Your physician has recommended that you have a sleep study.  This test records several body functions during sleep, including:  brain activity, eye movement, oxygen and carbon dioxide blood levels, heart rate and rhythm, breathing rate and rhythm, the flow of air through your mouth and nose, snoring, body muscle movements, and chest and belly movement. 3)  Admit for Tikosyn load

## 2010-05-04 NOTE — Medication Information (Signed)
Summary: Coumadin Clinic  Anticoagulant Therapy  Managed by: Bethena Midget, RN, BSN PCP: [name] Supervising MD: Daleen Squibb MD, Maisie Fus Indication 1: Atrial Fibrillation Lab Used: [facility] [place] Site: [place] PT 19.2 INR POC 1.6  Dietary changes: no    Health status changes: no    Bleeding/hemorrhagic complications: no    Recent/future hospitalizations: no    Any changes in medication regimen? no    Recent/future dental: no  Any missed doses?: no       Is patient compliant with meds? yes       Allergies: No Known Drug Allergies  Anticoagulation Management History:      Her anticoagulation is being managed by telephone today.  The patient is on warfarin for atrial fibrillation requiring cardioversion.  Negative risk factors for bleeding include an age less than 18 years old, no history of CVA/TIA, no history of GI bleeding, and absence of serious comorbidities.  The bleeding index is 'low risk'.  Positive CHADS2 values include History of HTN.  Negative CHADS2 values include History of CHF, Age > 20 years old, History of Diabetes, and Prior Stroke/CVA/TIA.  The start date was 06/02/2009.  Her last INR was 1.7.  Prothrombin time is 19.2.  Anticoagulation responsible provider: Daleen Squibb MD, Maisie Fus.  INR POC: 1.6.    Anticoagulation Management Assessment/Plan:      The patient's current anticoagulation dose is Coumadin 5 mg tabs: Take by mouth as directed by Coumadin clinic.  The target INR is 2.0-3.0.  The next INR is due 08/15/2009.  Anticoagulation instructions were given to patient.  Results were reviewed/authorized by Bethena Midget, RN, BSN.  She was notified by Bethena Midget, RN, BSN.         Prior Anticoagulation Instructions: Called with results of PT/INR obtained on pt today.  PT 24.2  INR 2.0  Order given for pt to continue coumadin 5mg  once daily.  She was started on amiodarone in the hospital.  [facility] to recheck INR on 08/04/09.  Current  Anticoagulation Instructions: INR 1.6 Change dose to 5mg  daily except 7.5mg s on Sundays and Thursdays. Recheck in 10 days. Orders given to Ernesto Rutherford with [facility]

## 2010-05-04 NOTE — Progress Notes (Signed)
Summary: [facility] FOR FRIDAY APPT  Phone Note Call from Patient Call back at Home Phone [phone]   Caller: PT HOME HEALTH NURSE Reason for Call: Talk to Nurse Summary of Call: S: PT HAS APPT WITH DR WALL 08/12/09 SHE HAS BEEN WEAK, NURSE HAS HARD TIME GETTING BP UNLESS PT IS UP MOVING AROUND FIRST. PT HAS BEEN HAVING DIARRHEA THAT IS LIME GREEN IN COLOR. JUST A FYI TO PASS ALONG TO DR WALL NO NEED TO CALL PT/TMJ Initial call taken by: Faythe Ghee,  Aug 11, 2009 10:25 AM  Follow-up for Phone Call        Pt. advised to contact her PCP, Dr. Juanetta Gosling, as this may not be cardiac issues.  Follow-up by: Larita Fife Via LPN,  Aug 11, 2009 11:06 AM     Appended Document: [facility] FOR FRIDAY APPT  Reviewed [name], MD

## 2010-05-04 NOTE — Medication Information (Signed)
Summary: ccr  Anticoagulant Therapy  Managed by: Jasmin Hey, RN PCP: [name] Supervising MD: [name] MD, [name] Indication 1: Atrial Fibrillation Lab Used: [facility]  Site: [place] INR POC 2.7  Dietary changes: no    Health status changes: yes       Details: had stomach virus  Bleeding/hemorrhagic complications: no    Recent/future hospitalizations: no    Any changes in medication regimen? no    Recent/future dental: no  Any missed doses?: no       Is patient compliant with meds? yes       Allergies: No Known Drug Allergies  Anticoagulation Management History:      The patient is taking warfarin and comes in today for a routine follow up visit.  Warfarin therapy is being given due to atrial fibrillation requiring cardioversion.  Negative risk factors for bleeding include an age less than 33 years old, no history of CVA/TIA, no history of GI bleeding, and absence of serious comorbidities.  The bleeding index is 'low risk'.  Positive CHADS2 values include History of HTN.  Negative CHADS2 values include History of CHF, Age > 44 years old, History of Diabetes, and Prior Stroke/CVA/TIA.  The start date was 06/02/2009.  Her last INR was 2.1 ratio.  Anticoagulation responsible Debby Clyne: [name] MD, [name].  INR POC: 2.7.  Cuvette Lot#: [id number].  Exp: 12/2010.    Anticoagulation Management Assessment/Plan:      The patient's current anticoagulation dose is Coumadin 5 mg tabs: 7.5mg  once daily except 5mg  on Mondays, Wednesdays and Fridays.  The target INR is 2.0-3.0.  The next INR is due 03/09/2010.  Anticoagulation instructions were given to patient.  Results were reviewed/authorized by Jasmin Hey, RN.  She was notified by Jasmin Hey RN.         Prior Anticoagulation Instructions: INR 2.5 Continue coumadin 7.5mg  once daily except 5mg  on Mondays, Wednesdays and Fridays  Current Anticoagulation Instructions: INR 2.7 Continue coumadin 7.5mg  once  daily except 5mg  on Mondays, Wednesdays and Fridays

## 2010-05-04 NOTE — Assessment & Plan Note (Signed)
Summary: 1 MTH F/U PER CHECKOUT ON 08/12/09/TG   Visit Type:  Follow-up Referring Provider:  [name], MD Primary Provider:  [name]  CC:  no cardiology complaints.  History of Present Illness: [name] comes in today for followup of her atrial fibrillation. Since I last saw her, and she has been evaluated by Dr. Johney Frame. Please see his extensive note.  She is not a candidate for ablation because of her size. She's been anticoagulated with a therapeutic level for almost a month. She has another protime scheduled this coming Tuesday. If this is therapeutic, she will be admitted to the hospital for [facility] on June 20.  She also had a sleep study. She has mild obstructive sleep apnea with desaturations. Titration study suggested.   She's lost 4 additional pound      Current Medications (verified): 1)  Alprazolam 1 Mg Tabs (Alprazolam) .... As Needed 2)  Cardizem La 360 Mg Xr24h-Tab (Diltiazem Hcl Coated Beads) .... Take 1 Tab Daily 3)  Coumadin 5 Mg Tabs (Warfarin Sodium) .... Take By Mouth As Directed By Coumadin Clinic 4)  Hydrocodone-Acetaminophen 5-500 Mg Tabs (Hydrocodone-Acetaminophen) .... Take As Needed For Pain 5)  Furosemide 40 Mg Tabs (Furosemide) .... Take One Tablet By Mouth Daily. 6)  Aspir-Low 81 Mg Tbec (Aspirin) .... Take 1 Tab Daily 7)  Metoprolol Succinate 100 Mg Xr24h-Tab (Metoprolol Succinate) .... Take One Tablet By Mouth Daily 8)  Potassium Chloride Crys Cr 20 Meq Cr-Tabs (Potassium Chloride Crys Cr) .... Take 1 Tablet By Mouth Once A Day  Allergies (verified): No Known Drug Allergies  Past History:  Past Medical History: Last updated: 08/24/2009 Persistent atrial fibrillation Morbid Obesity HTN DJD denies h/o rheumatic fever  denies h/o diabetes, CVA  Past Surgical History: Last updated: 08/24/2009 bilateral knee surgery hysterectomy tonsillectomy right ankle surgery cardiac cath 1990s, normal per patient Cholecystectomy  2001  Family History: Last updated: Jun 15, 2009 Father:deceased age 69 lung cancer Mother:deceased (murdered) age 57  Social History: Last updated: 08/24/2009 separated and lives in [place] Tobacco Use - No.  Alcohol Use - no Regular Exercise - no Drug Use - no  Risk Factors: Exercise: no (2009/06/15)  Risk Factors: Smoking Status: never (June 15, 2009)  Review of Systems       negative other than history of present illness  Vital Signs:  Patient profile:   55 year old female Weight:      317 pounds O2 Sat:      96 % on Room air Pulse rate:   57 / minute BP sitting:   135 / 94  (right arm)  Vitals Entered By: Dreama Saa, CNA (September 09, 2009 1:08 PM)  O2 Flow:  Room air  Physical Exam  General:  morbidly obese Head:  normocephalic and atraumatic Eyes:  PERRLA/EOM intact; conjunctiva and lids normal. Neck:  Neck supple, no JVD. No masses, thyromegaly or abnormal cervical nodes. Chest Jasmin Martin:  no deformities or breast masses noted Lungs:  Clear bilaterally to auscultation and percussion. Heart:  PMI poorly appreciated Msk:  Back normal, normal gait. Muscle strength and tone normal. Pulses:  pulses normal in all 4 extremities Extremities:  trace left pedal edema and trace right pedal edema.   Neurologic:  Alert and oriented x 3. Skin:  Intact without lesions or rashes. Psych:  Normal affect.   Impression & Recommendations:  Problem # 1:  FIBRILLATION, ATRIAL (ICD-427.31) Assessment Unchanged If her on our therapeutic this coming Tuesday, she'll be admitted on June 20 for  Tikosyn loading. Her updated medication list for this problem includes:    Coumadin 5 Mg Tabs (Warfarin sodium) .Marland Kitchen... Take by mouth as directed by coumadin clinic    Aspir-low 81 Mg Tbec (Aspirin) .Marland Kitchen... Take 1 tab daily    Metoprolol Succinate 100 Mg Xr24h-tab (Metoprolol succinate) .Marland Kitchen... Take one tablet by mouth daily  Problem # 2:  DYSPNEA (ICD-786.05) Assessment: Unchanged  Her updated  medication list for this problem includes:    Cardizem La 360 Mg Xr24h-tab (Diltiazem hcl coated beads) .Marland Kitchen... Take 1 tab daily    Furosemide 40 Mg Tabs (Furosemide) .Marland Kitchen... Take one tablet by mouth daily.    Aspir-low 81 Mg Tbec (Aspirin) .Marland Kitchen... Take 1 tab daily    Metoprolol Succinate 100 Mg Xr24h-tab (Metoprolol succinate) .Marland Kitchen... Take one tablet by mouth daily  Problem # 3:  OBESITY-MORBID (>100') (ICD-278.01) Assessment: Improved  Problem # 4:  PALPITATIONS (ICD-785.1) Assessment: Improved  Her updated medication list for this problem includes:    Cardizem La 360 Mg Xr24h-tab (Diltiazem hcl coated beads) .Marland Kitchen... Take 1 tab daily    Coumadin 5 Mg Tabs (Warfarin sodium) .Marland Kitchen... Take by mouth as directed by coumadin clinic    Aspir-low 81 Mg Tbec (Aspirin) .Marland Kitchen... Take 1 tab daily    Metoprolol Succinate 100 Mg Xr24h-tab (Metoprolol succinate) .Marland Kitchen... Take one tablet by mouth daily  Problem # 5:  COUMADIN THERAPY (ICD-V58.61) Assessment: Unchanged  Patient Instructions: 1)  Your physician recommends that you schedule a follow-up appointment on: July 8, 11 2)  Your physician recommends that you continue on your current medications as directed. Please refer to the Current Medication list given to you today.

## 2010-05-04 NOTE — Letter (Signed)
Summary: TEE Instructions [place]  [facility]  [address] [place] [address]   Phone: [phone]  Fax: [phone]      TEE Instructions    You are scheduled for a Cardioversion on July 14, 2009 with Dr. [name].  Please arrive at the [facility] of [facility] at 8:30 a.m. on the day of your procedure.  1)   Diet:     A)   Nothing to eat or drink after midnight except your medications with        a sip of water.     2)  Must have a responsible person to drive you home.  3)   Bring your current insurance cards and current list of all your medications.   *Special Note:  Every effort is made to have your procedure done on time.  Occasionally there are emergencies that present themselves at the hospital that may cause delays.  Please be patient if a delay does occur.  *If you have any questions after you get home, please call the office at [phone].

## 2010-05-04 NOTE — Assessment & Plan Note (Signed)
Summary: POST HOSP [place] PER DEPT. 300/TG   Visit Type:  Follow-up Primary Provider:  [name]  CC:  sob.  History of Present Illness: [name] returns today posthospitalization with recurrent atrial for ablation with a rapid ventricular rate and some mild congestive heart failure. Please refer to the discharge summary by Dr. Juanetta Gosling. This was in mid April here in [place].  She still remains short of breath. Her amiodarone was increased during hospitalization. She remains in atrial fibrillation with a rate of about 100 beats per minute. She is gaining weight. She is very frustrated and very stressed with the atrial fib and her inability to walk without becoming extremely short of breath.  She denies any chest tightness or angina. She's had no orthopnea or PND. She has had some edema.  She remains on Coumadin. Recheck by home health nurse apparently. Her last Tylenol was 1.6 and she's having another check this coming Monday. I have made it very clear to her that she needs it between 2 and 3.  She denies any symptoms of TIAs or mini strokes.  Echocardiogram during last hospitalization showed normal left ventricular function with an EF of 60% left atrium measured 40 mm.  Current Medications (verified): 1)  Alprazolam 0.5 Mg Tabs (Alprazolam) .... Take As Needed 2)  Cardizem La 360 Mg Xr24h-Tab (Diltiazem Hcl Coated Beads) .... Take 1 Tab Daily 3)  Coumadin 5 Mg Tabs (Warfarin Sodium) .... Take By Mouth As Directed By Coumadin Clinic 4)  Hydrocodone-Acetaminophen 5-500 Mg Tabs (Hydrocodone-Acetaminophen) .... Take As Needed For Pain 5)  Furosemide 40 Mg Tabs (Furosemide) .... Take One Tablet By Mouth Daily. 6)  Aspir-Trin 325 Mg Tbec (Aspirin) .... Take 1 Tab Daily 7)  Metoprolol Succinate 100 Mg Xr24h-Tab (Metoprolol Succinate) .... Take One Tablet By Mouth Daily 8)  Potassium Chloride Crys Cr 20 Meq Cr-Tabs (Potassium Chloride Crys Cr) .... Take 1 Tablet By Mouth Once  A Day  Allergies (verified): No Known Drug Allergies  Past History:  Past Medical History: Last updated: 06-30-2009 Current Problems:  HYPOKALEMIA (ICD-276.8) INFLUENZA (ICD-487.8) PNEUMONIA (ICD-486) FIBRILLATION, ATRIAL (ICD-427.31)  Past Surgical History: Last updated: 06/02/2009 bilateral knee surgery hysterectomy tonsillectomy right ankle surgery cardiac cath Cholecystectomy 2001  Family History: Last updated: 06-30-09 Father:deceased age 87 lung cancer Mother:deceased (murdered) age 69  Social History: Last updated: 06/02/2009 separated Tobacco Use - No.  Alcohol Use - no Regular Exercise - no Drug Use - no  Risk Factors: Exercise: no (06/30/09)  Risk Factors: Smoking Status: never (06-30-2009)  Review of Systems       negative other than history of present illness.  Vital Signs:  Patient profile:   55 year old female Weight:      319 pounds Pulse rate:   75 / minute BP sitting:   136 / 87  (right arm)  Vitals Entered By: Dreama Saa, CNA (Aug 12, 2009 10:58 AM)  Physical Exam  General:  obese.  anxious, no acute distress Head:  normocephalic and atraumatic Eyes:  PERRLA/EOM intact; conjunctiva and lids normal. Neck:  Neck supple, no JVD. No masses, thyromegaly or abnormal cervical nodes. Chest Robyn Galati:  no deformities or breast masses noted Lungs:  clear to auscultation percussion Heart:  PMI difficult to appreciate, irregular rate and rhythm, no obvious gallop, variable S1 and S2 splits Abdomen:  marked obesity precludes adequate evaluation Msk:  decreased ROM.   Pulses:  pulses normal in all 4 extremities Extremities:  1+ left pedal edema and  1+ right pedal edema.   Neurologic:  Alert and oriented x 3. Skin:  Intact without lesions or rashes. Psych:  anxious.     Problems:  Medical Problems Added: 1)  Dx of Obesity-morbid (>100')  (ICD-278.01)  EKG  Procedure date:  08/12/2009  Findings:      atrial for ablation with a  rate of 9200 beats per minute, low voltage, incomplete right bundle branch block, no acute changes otherwise  Impression & Recommendations:  Problem # 1:  FIBRILLATION, ATRIAL (ICD-427.31) I have shared with her that the amiodarone has failed. She wants everything done were evaluated. I will refer her to Dr. Johney Frame for possible consideration of an ablation. In the meantime we have reinforced the importance of adequate therapeutic anticoagulation and will restart her metoprolol 100 mg per day for rate control. Have also added some potassium with her history of hypokalemia at 20 mEq a day and increased her Lasix to 40 mg a day. The following medications were removed from the medication list:    Metoprolol Succinate 100 Mg Xr24h-tab (Metoprolol succinate) .Marland Kitchen... Take one tablet by mouth daily    Amiodarone Hcl 200 Mg Tabs (Amiodarone hcl) .Marland Kitchen... Take one tablet by mouth twice a day Her updated medication list for this problem includes:    Coumadin 5 Mg Tabs (Warfarin sodium) .Marland Kitchen... Take by mouth as directed by coumadin clinic    Aspir-trin 325 Mg Tbec (Aspirin) .Marland Kitchen... Take 1 tab daily    Metoprolol Succinate 100 Mg Xr24h-tab (Metoprolol succinate) .Marland Kitchen... Take one tablet by mouth daily  Problem # 2:  ANXIETY STATE, UNSPECIFIED (ICD-300.00) Assessment: Deteriorated  Problem # 3:  PALPITATIONS (ICD-785.1) Assessment: Deteriorated  The following medications were removed from the medication list:    Metoprolol Succinate 100 Mg Xr24h-tab (Metoprolol succinate) .Marland Kitchen... Take one tablet by mouth daily    Amiodarone Hcl 200 Mg Tabs (Amiodarone hcl) .Marland Kitchen... Take one tablet by mouth twice a day Her updated medication list for this problem includes:    Cardizem La 360 Mg Xr24h-tab (Diltiazem hcl coated beads) .Marland Kitchen... Take 1 tab daily    Coumadin 5 Mg Tabs (Warfarin sodium) .Marland Kitchen... Take by mouth as directed by coumadin clinic    Aspir-trin 325 Mg Tbec (Aspirin) .Marland Kitchen... Take 1 tab daily    Metoprolol Succinate 100 Mg  Xr24h-tab (Metoprolol succinate) .Marland Kitchen... Take one tablet by mouth daily  Problem # 4:  HYPERTENSION, BENIGN (ICD-401.1) Assessment: Improved  The following medications were removed from the medication list:    Metoprolol Succinate 100 Mg Xr24h-tab (Metoprolol succinate) .Marland Kitchen... Take one tablet by mouth daily Her updated medication list for this problem includes:    Cardizem La 360 Mg Xr24h-tab (Diltiazem hcl coated beads) .Marland Kitchen... Take 1 tab daily    Furosemide 40 Mg Tabs (Furosemide) .Marland Kitchen... Take one tablet by mouth daily.    Aspir-trin 325 Mg Tbec (Aspirin) .Marland Kitchen... Take 1 tab daily    Metoprolol Succinate 100 Mg Xr24h-tab (Metoprolol succinate) .Marland Kitchen... Take one tablet by mouth daily  Problem # 5:  OBESITY-MORBID (>100') (ICD-278.01)  Patient Instructions: 1)  Your physician recommends that you schedule a follow-up appointment in: 1 month 2)  Your physician has recommended you make the following change in your medication: stop amiodorone 3)  metorprolol succinate 100mg  daily 4)  Increase lasix to 40mg  daily 5)  potassium daily 6)  You have been referred to Dr. Johney Frame in [place], [place] Prescriptions: POTASSIUM CHLORIDE CRYS CR 20 MEQ CR-TABS (POTASSIUM CHLORIDE CRYS CR) Take 1 tablet  by mouth once a day  #30 x 6   Entered by:   [name] RN   Authorized by:   Gaylord Shih, MD, [facility]   Signed by:   Gaylord Shih, MD, [facility] on 08/12/2009   Method used:   Electronically to        [facility] (retail)       [address]       [place], [place]  [address]       Ph: [phone]       Fax: [phone]   RxID:   [id number] FUROSEMIDE 40 MG TABS (FUROSEMIDE) Take one tablet by mouth daily.  #30 x 3   Entered by:   [name] RN   Authorized by:   Gaylord Shih, MD, [facility]   Signed by:   Gaylord Shih, MD, [facility] on 08/12/2009   Method used:   Electronically to        [facility] (retail)       [address]       [place], [place]  [address]       Ph: [phone]       Fax: [phone]   RxID:   [id number] METOPROLOL SUCCINATE 100 MG XR24H-TAB (METOPROLOL SUCCINATE) Take one tablet by mouth daily  #30 x 6   Entered by:   [name] RN   Authorized by:   Gaylord Shih, MD, [facility]   Signed by:   Gaylord Shih, MD, [facility] on 08/12/2009   Method used:   Electronically to        [facility] (retail)       [address]       [place], [place]  [address]       Ph: [phone]       Fax: [phone]   RxID:   [id number]

## 2010-05-04 NOTE — Miscellaneous (Signed)
  Clinical Lists Changes  Medications: Changed medication from COUMADIN 5 MG TABS (WARFARIN SODIUM) Take by mouth as directed by Coumadin clinic to COUMADIN 5 MG TABS (WARFARIN SODIUM) 7.5mg  once daily except 5mg  on Mondays, Wednesdays and Fridays - Signed Rx of COUMADIN 5 MG TABS (WARFARIN SODIUM) 7.5mg  once daily except 5mg  on Mondays, Wednesdays and Fridays;  #60 x 2;  Signed;  Entered by: [name] RN;  Authorized by: Kathlen Brunswick, MD, [facility];  Method used: Electronically to [facility] [address], [place], [place], [place]  [address], Ph: [phone], Fax: [phone]    Prescriptions: COUMADIN 5 MG TABS (WARFARIN SODIUM) 7.5mg  once daily except 5mg  on Mondays, Wednesdays and Fridays  #60 x 2   Entered by:   [name] RN   Authorized by:   Kathlen Brunswick, MD, [facility]   Signed by:   [name] RN on 12/30/2009   Method used:   Electronically to        [facility] (retail)       [address]       [place], [place]  [address]       Ph: [phone]       Fax: [phone]   RxID:   [id number]

## 2010-05-04 NOTE — Miscellaneous (Signed)
**Note De-identified Jasmin Martin Obfuscation** Summary: Orders Update  Clinical Lists Changes  Orders: Added new Test order of T-Basic Metabolic Panel ([id number]) - Signed  

## 2010-05-04 NOTE — Medication Information (Signed)
Summary: ccr-lr  Anticoagulant Therapy  Managed by: Vashti Hey, RN PCP: [name] Supervising MD: Daleen Squibb MD, Maisie Fus Indication 1: Atrial Fibrillation Lab Used: [facility] [place] Site: [place] INR POC 2.4  Dietary changes: no    Health status changes: no    Bleeding/hemorrhagic complications: no    Recent/future hospitalizations: no    Any changes in medication regimen? no    Recent/future dental: no  Any missed doses?: no       Is patient compliant with meds? yes       Allergies: No Known Drug Allergies  Anticoagulation Management History:      The patient is taking warfarin and comes in today for a routine follow up visit.  Warfarin therapy is being given due to atrial fibrillation requiring cardioversion.  Negative risk factors for bleeding include an age less than 36 years old, no history of CVA/TIA, no history of GI bleeding, and absence of serious comorbidities.  The bleeding index is 'low risk'.  Positive CHADS2 values include History of HTN.  Negative CHADS2 values include History of CHF, Age > 46 years old, History of Diabetes, and Prior Stroke/CVA/TIA.  The start date was 06/02/2009.  Her last INR was 2.1 ratio.  Anticoagulation responsible provider: Daleen Squibb MD, Maisie Fus.  INR POC: 2.4.  Cuvette Lot#: [id number].  Exp: 12/2010.    Anticoagulation Management Assessment/Plan:      The patient's current anticoagulation dose is Coumadin 5 mg tabs: 7.5mg  once daily except 5mg  on Mondays, Wednesdays and Fridays.  The target INR is 2.0-3.0.  The next INR is due 04/06/2010.  Anticoagulation instructions were given to patient.  Results were reviewed/authorized by Vashti Hey, RN.  She was notified by Vashti Hey RN.         Prior Anticoagulation Instructions: INR 2.7 Continue coumadin 7.5mg  once daily except 5mg  on Mondays, Wednesdays and Fridays  Current Anticoagulation Instructions: INR 2.4 Continue coumadin 7.5mg  once daily except 5mg  on Mondays,  Wdenesdays and Fridays

## 2010-05-04 NOTE — Progress Notes (Signed)
  Phone Note Call from Patient   Caller: Patient Call For: HTN Summary of Call: pt here today for coumadin visit, asked for her bp to be checked: 150/103, 84- pt has been therapeutic inr for 2 weeks needs 1 more week before she can be scheduled for her cardioversion, c/0 feeling very tired,listless and sob Initial call taken by: [name] RN,  July 04, 2009 4:12 PM  Follow-up for Phone Call        Increase metoprolol to 100mg  a day. Arrange CV with me next week when in [place]. Follow-up by: Gaylord Shih, MD, [facility],  July 05, 2009 11:04 AM     Appended Document:  Discussed increase in med with pt, to be seen in [facility] Monday 07/11/2009, will set up cardioversion at that time if  therapeutic.If she is therapeutic and we can schedule cardioversion next week, pt will see Dr. Daleen Squibb on 07/15/2009   Clinical Lists Changes  Medications: Changed medication from METOPROLOL SUCCINATE 50 MG XR24H-TAB (METOPROLOL SUCCINATE) take 1 tablet by mouth every morning to METOPROLOL SUCCINATE 50 MG XR24H-TAB (METOPROLOL SUCCINATE) take2 tablet by mouth every morning

## 2010-05-04 NOTE — Medication Information (Signed)
Summary: ccr-lr  Anticoagulant Therapy  Managed by: Vashti Hey, RN PCP: [name] Supervising MD: Diona Browner MD, Remi Deter Indication 1: Atrial Fibrillation Lab Used: [facility] [place] Site: [place] INR POC 2.1  Dietary changes: no    Health status changes: no    Bleeding/hemorrhagic complications: no    Recent/future hospitalizations: no    Any changes in medication regimen? no    Recent/future dental: no  Any missed doses?: no       Is patient compliant with meds? yes       Allergies: No Known Drug Allergies  Anticoagulation Management History:      The patient is taking warfarin and comes in today for a routine follow up visit.  She is being anticoagulated due to atrial fibrillation requiring cardioversion.  Negative risk factors for bleeding include an age less than 44 years old, no history of CVA/TIA, no history of GI bleeding, and absence of serious comorbidities.  The bleeding index is 'low risk'.  Positive CHADS2 values include History of HTN.  Negative CHADS2 values include History of CHF, Age > 16 years old, History of Diabetes, and Prior Stroke/CVA/TIA.  The start date was 06/02/2009.  Her last INR was 2.1 ratio.  Anticoagulation responsible provider: Diona Browner MD, Remi Deter.  INR POC: 2.1.  Cuvette Lot#: [id number].  Exp: 12/2010.    Anticoagulation Management Assessment/Plan:      The patient's current anticoagulation dose is Coumadin 5 mg tabs: Take by mouth as directed by Coumadin clinic.  The target INR is 2.0-3.0.  The next INR is due 11/11/2009.  Anticoagulation instructions were given to patient.  Results were reviewed/authorized by Vashti Hey, RN.  She was notified by Vashti Hey RN.         Prior Anticoagulation Instructions: INR 2.5 Continue comadin 5mg  once daily except 7.5mg  on Sundays and Thursdays  Current Anticoagulation Instructions: INR 2.1 Take coumadin 1 1/2 tablets tonight then resume 1 tablet once daily except 1 1/2 tablets on  Sundays and Thursdays

## 2010-05-04 NOTE — Assessment & Plan Note (Signed)
Summary: per checkout on 09/09/09/tg   Referring Provider:  [name], MD Primary Provider:  [name]   History of Present Illness: [name] returns post hosp for close followup of her PAF. She was placed on Tikosyn. She remains in NSR.  Her complaint is fatigue. Her Toprol was decreased to 50mg  from 100mg . Her HR is 50 today but she is asymtomatic. Her diastolic BP is alittle high. She is watching sodium. She remains heavy.  Current Medications (verified): 1)  Alprazolam 1 Mg Tabs (Alprazolam) .... As Needed 2)  Coumadin 5 Mg Tabs (Warfarin Sodium) .... Take By Mouth As Directed By Coumadin Clinic 3)  Hydrocodone-Acetaminophen 5-500 Mg Tabs (Hydrocodone-Acetaminophen) .... Take As Needed For Pain 4)  Furosemide 40 Mg Tabs (Furosemide) .... Take One Tablet By Mouth Daily. 5)  Aspir-Low 81 Mg Tbec (Aspirin) .... Take 1 Tab Daily 6)  Metoprolol Succinate 50 Mg Xr24h-Tab (Metoprolol Succinate) .Marland Kitchen.. 1 By Mouth Daily 7)  Potassium Chloride Crys Cr 20 Meq Cr-Tabs (Potassium Chloride Crys Cr) .... Take 1 Tablet By Mouth Once A Day 8)  Tikosyn 500 Mcg Caps (Dofetilide) .Marland Kitchen.. 1 By Mouth Two Times A Day 9)  Lisinopril 20 Mg Tabs (Lisinopril) .... Take 1 Tablet By Mouth Once Daily  Allergies (verified): No Known Drug Allergies  Past History:  Past Medical History: Last updated: 08/24/2009 Persistent atrial fibrillation Morbid Obesity HTN DJD denies h/o rheumatic fever  denies h/o diabetes, CVA  Past Surgical History: Last updated: 08/24/2009 bilateral knee surgery hysterectomy tonsillectomy right ankle surgery cardiac cath 1990s, normal per patient Cholecystectomy 2001  Family History: Last updated: 06/12/2009 Father:deceased age 63 lung cancer Mother:deceased (murdered) age 21  Social History: Last updated: 08/24/2009 separated and lives in [place] Tobacco Use - No.  Alcohol Use - no Regular Exercise - no Drug Use - no  Risk Factors: Exercise: no  (2009/06/12)  Risk Factors: Smoking Status: never (06/12/2009)  Review of Systems       negative other than HPI  Vital Signs:  Patient profile:   55 year old female Height:      64 inches Weight:      327 pounds BMI:     56.33 Pulse rate:   48 / minute Resp:     18 per minute BP sitting:   128 / 98  (right arm)  Vitals Entered By: Marrion Coy, CNA (October 18, 2009 10:36 AM)  Physical Exam  General:  obese.   Head:  normocephalic and atraumatic Eyes:  PERRLA/EOM intact; conjunctiva and lids normal. Neck:  Neck supple, no JVD. No masses, thyromegaly or abnormal cervical nodes. Chest Jasmin Martin:  no deformities or breast masses noted Lungs:  Clear bilaterally to auscultation and percussion. Heart:  Non-displaced PMI, chest non-tender; regular rate and rhythm, S1, S2 without murmurs, rubs or gallops. Carotid upstroke normal, no bruit. Normal abdominal aortic size, no bruits. Femorals normal pulses, no bruits. Pedals normal pulses. No edema, no varicosities. Msk:  Back normal, normal gait. Muscle strength and tone normal. Pulses:  pulses normal in all 4 extremities Extremities:  trace left pedal edema and trace right pedal edema.   Neurologic:  Alert and oriented x 3. Skin:  Intact without lesions or rashes. Psych:  anxious.     EKG  Procedure date:  10/18/2009  Findings:      Sinus bradycardia, QTc normal  Impression & Recommendations:  Problem # 1:  FIBRILLATION, ATRIAL (ICD-427.31) Assessment Improved Check BMET and Mg. Her updated medication list for  this problem includes:    Coumadin 5 Mg Tabs (Warfarin sodium) .Marland Kitchen... Take by mouth as directed by coumadin clinic    Aspir-low 81 Mg Tbec (Aspirin) .Marland Kitchen... Take 1 tab daily    Metoprolol Succinate 50 Mg Xr24h-tab (Metoprolol succinate) .Marland Kitchen... 1 by mouth daily    Tikosyn 500 Mcg Caps (Dofetilide) .Marland Kitchen... 1 by mouth two times a day  Problem # 2:  COUMADIN THERAPY (ICD-V58.61) Assessment: Unchanged  Problem # 3:  OBESITY-MORBID  (>100') (ICD-278.01) Assessment: Unchanged  Problem # 4:  ANXIETY STATE, UNSPECIFIED (ICD-300.00) Assessment: Unchanged  Problem # 5:  PALPITATIONS (ICD-785.1) Assessment: Improved  The following medications were removed from the medication list:    Cardizem La 360 Mg Xr24h-tab (Diltiazem hcl coated beads) .Marland Kitchen... Take 1 tab daily Her updated medication list for this problem includes:    Coumadin 5 Mg Tabs (Warfarin sodium) .Marland Kitchen... Take by mouth as directed by coumadin clinic    Aspir-low 81 Mg Tbec (Aspirin) .Marland Kitchen... Take 1 tab daily    Metoprolol Succinate 50 Mg Xr24h-tab (Metoprolol succinate) .Marland Kitchen... 1 by mouth daily    Tikosyn 500 Mcg Caps (Dofetilide) .Marland Kitchen... 1 by mouth two times a day    Lisinopril 20 Mg Tabs (Lisinopril) .Marland Kitchen... Take 1 tablet by mouth once daily  Problem # 6:  HYPERTENSION, BENIGN (ICD-401.1) Assessment: Deteriorated Will add lisinopril 20mg /d. The following medications were removed from the medication list:    Cardizem La 360 Mg Xr24h-tab (Diltiazem hcl coated beads) .Marland Kitchen... Take 1 tab daily Her updated medication list for this problem includes:    Furosemide 40 Mg Tabs (Furosemide) .Marland Kitchen... Take one tablet by mouth daily.    Aspir-low 81 Mg Tbec (Aspirin) .Marland Kitchen... Take 1 tab daily    Metoprolol Succinate 50 Mg Xr24h-tab (Metoprolol succinate) .Marland Kitchen... 1 by mouth daily    Lisinopril 20 Mg Tabs (Lisinopril) .Marland Kitchen... Take 1 tablet by mouth once daily  Orders: T-Basic Metabolic Panel [id number])  Other Orders: T-Magnesium ([id number])  Patient Instructions: 1)  Your physician recommends that you schedule a follow-up appointment in: 3 months 2)  Your physician recommends that you return for lab work in: today 3)  Your physician has recommended you make the following change in your medication: Start taking Lisinopril 20mg  by mouth once daily  Prescriptions: LISINOPRIL 20 MG TABS (LISINOPRIL) take 1 tablet by mouth once daily  #30 x 2   Entered by:   Larita Fife Via LPN   Authorized  by:   Gaylord Shih, MD, [facility]   Signed by:   Larita Fife Via LPN on 32/44/0102   Method used:   Electronically to        [facility] (retail)       [address]       [place], [place]  [address]       Ph: [phone]       Fax: [phone]   RxID:   [id number]

## 2010-05-04 NOTE — Medication Information (Signed)
Summary: ccr-lr  Anticoagulant Therapy  Managed by: Vashti Hey, RN PCP: [name] Supervising MD: [name] MD, [name] Indication 1: Atrial Fibrillation Lab Used: [facility] [place] Site: [place] INR POC 1.8  Dietary changes: no    Health status changes: no    Bleeding/hemorrhagic complications: no    Recent/future hospitalizations: no    Any changes in medication regimen? no    Recent/future dental: no  Any missed doses?: no       Is patient compliant with meds? yes       Allergies: No Known Drug Allergies  Anticoagulation Management History:      The patient is taking warfarin and comes in today for a routine follow up visit.  Warfarin therapy is being given due to atrial fibrillation requiring cardioversion.  Negative risk factors for bleeding include an age less than 10 years old, no history of CVA/TIA, no history of GI bleeding, and absence of serious comorbidities.  The bleeding index is 'low risk'.  Positive CHADS2 values include History of HTN.  Negative CHADS2 values include History of CHF, Age > 66 years old, History of Diabetes, and Prior Stroke/CVA/TIA.  The start date was 06/02/2009.  Her last INR was 2.1 ratio.  Anticoagulation responsible provider: [name] MD, [name].  INR POC: 1.8.  Cuvette Lot#: [id number].  Exp: 12/2010.    Anticoagulation Management Assessment/Plan:      The patient's current anticoagulation dose is Coumadin 5 mg tabs: 7.5mg  once daily except 5mg  on Mondays, Wednesdays and Fridays.  The target INR is 2.0-3.0.  The next INR is due 05/10/2010.  Anticoagulation instructions were given to patient.  Results were reviewed/authorized by Vashti Hey, RN.  She was notified by Vashti Hey RN.         Prior Anticoagulation Instructions: INR 2.4 Continue coumadin 7.5mg  once daily except 5mg  on Mondays, Wdenesdays and Fridays  Current Anticoagulation Instructions: INR 1.8 Take coumadin 2 tablets tonight then resume 1 /2 tablets  once daily except 1 tablet on Mondays, Wednesdays and Fridays

## 2010-05-04 NOTE — Medication Information (Signed)
Summary: ccr-lr  Anticoagulant Therapy  Managed by: Vashti Hey, RN PCP: [name] Supervising MD: [name] MD, [name] Indication 1: Atrial Fibrillation Lab Used: [facility] [place] Site: [place] INR POC 2.9  Dietary changes: no    Health status changes: no    Bleeding/hemorrhagic complications: no    Recent/future hospitalizations: yes       Details: pending DCCV  Any changes in medication regimen? no    Recent/future dental: no  Any missed doses?: no       Is patient compliant with meds? yes       Allergies: No Known Drug Allergies  Anticoagulation Management History:      The patient is taking warfarin and comes in today for a routine follow up visit.  The patient is taking warfarin for atrial fibrillation requiring cardioversion.  Negative risk factors for bleeding include an age less than 25 years old, no history of CVA/TIA, no history of GI bleeding, and absence of serious comorbidities.  The bleeding index is 'low risk'.  Positive CHADS2 values include History of HTN.  Negative CHADS2 values include History of CHF, Age > 36 years old, History of Diabetes, and Prior Stroke/CVA/TIA.  The start date was 06/02/2009.  Anticoagulation responsible provider: [name] MD, [name].  INR POC: 2.9.  Cuvette Lot#: [id number].    Anticoagulation Management Assessment/Plan:      The patient's current anticoagulation dose is Coumadin 5 mg tabs: Take by mouth as directed by Coumadin clinic.  The target INR is 2.0-3.0.  The next INR is due 07/11/2009.  Anticoagulation instructions were given to patient.  Results were reviewed/authorized by Vashti Hey, RN.  She was notified by Vashti Hey RN.         Prior Anticoagulation Instructions: INR 2.7 Continue coumadin 7.5mg  once daily except 5mg  on Sunday  Current Anticoagulation Instructions: INR 2.9 Continue coumadin 7.5mg  once daily except 5mg  on Sundays

## 2010-05-04 NOTE — Progress Notes (Signed)
Summary: Question regarding labwork  Phone Note Call from Patient Call back at [phone]   Caller: Patient Summary of Call: pt states she received paperwork to do labwork/cardioversion has been put on hold/pt wants to know if she needs to have bloodwork done or hold that also/tg Initial call taken by: Raechel Ache [facility],  June 30, 2009 12:38 PM  Follow-up for Phone Call        I asked pt to hold to her labwork until her inr is therapeutic for 3 weeks  Follow-up by: [name] RN,  June 30, 2009 2:35 PM

## 2010-05-04 NOTE — Progress Notes (Signed)
Summary: coumadin management  Phone Note From Other Clinic Call back at [phone]   Caller: [facility] Request: Talk with Nurse Summary of Call: INR 2.5 PT 30.6 Initial call taken by: Faythe Ghee,  September 14, 2009 4:56 PM  Follow-up for Phone Call        Spoke with Ernesto Rutherford RN [facility].  Order given for pt to continue coumadin 5mg  once daily except 7.5mg   on Sundays and Tuesdays.  Pt scheduled to be admitted to [facility] 09/19/09 for flecanide loading.  INR will be repeated after hospital discharge Follow-up by: Vashti Hey RN,  September 15, 2009 8:05 AM     Anticoagulant Therapy  Managed by: Vashti Hey RN PCP: [name] Supervising MD: [name] MD, [name] Indication 1: Atrial Fibrillation Lab Used: [facility] [place] Site: [place] PT 30.6 INR POC 2.5  Dietary changes: no    Health status changes: no    Bleeding/hemorrhagic complications: no    Recent/future hospitalizations: yes       Details: Scheduled to be admitted to [facility] 09/19/09 for flecanide loading  Any changes in medication regimen? no    Recent/future dental: no  Any missed doses?: no       Is patient compliant with meds? yes         Anticoagulation Management History:      Her anticoagulation is being managed by telephone today.  The patient is on warfarin for atrial fibrillation requiring cardioversion.  Negative risk factors for bleeding include an age less than 69 years old, no history of CVA/TIA, no history of GI bleeding, and absence of serious comorbidities.  The bleeding index is 'low risk'.  Positive CHADS2 values include History of HTN.  Negative CHADS2 values include History of CHF, Age > 45 years old, History of Diabetes, and Prior Stroke/CVA/TIA.  The start date was 06/02/2009.  Her last INR was 2.1 ratio.  Prothrombin time is 30.6.  Anticoagulation responsible provider: [name] MD, [name].  INR POC: 2.5.    Anticoagulation Management Assessment/Plan:      The  patient's current anticoagulation dose is Coumadin 5 mg tabs: Take by mouth as directed by Coumadin clinic.  The target INR is 2.0-3.0.  The next INR is due 09/26/2009.  Anticoagulation instructions were given to patient.  Results were reviewed/authorized by Vashti Hey RN.  She was notified by Vashti Hey RN.         Prior Anticoagulation Instructions: Called with results of INR obtained on pt today.  INR 2.6   Order given for pt to continue coumadin 5mg  once daily except 7.5mg  on Sundays and Thursdays.  [facility] to recheck INR on 09/14/09.  Pt scheduled for flecanide challenge at [facility] on 09/19/09.   Current Anticoagulation Instructions: Spoke with Ernesto Rutherford RN [facility].  Order given for pt to continue coumadin 5mg  once daily except 7.5mg   on Sundays and Tuesdays.  Pt scheduled to be admitted to [facility] 09/19/09 for flecanide loading.  INR will be repeated after hospital discharge  Appended Document: coumadin management Dose is 5mg  once daily except 7.5mg  on Sundays and Thursdays.

## 2010-05-04 NOTE — Progress Notes (Signed)
**Note De-Identified Kalup Jaquith Obfuscation** Summary: Lasix  Phone Note Outgoing Call   Call placed by: Larita Fife Radiance Deady LPN,  July 18, 2009 2:45 PM Summary of Call: [facility]. Pt. needs to start taking Lasix 20mg  by mouth once daily.  Follow-up for Phone Call        Pt. advised. She states she understands info. given. Follow-up by: Larita Fife Cailie Bosshart LPN,  July 18, 2009 2:58 PM

## 2010-05-04 NOTE — Miscellaneous (Signed)
Summary: LABS BMP,MAGNESIUM,10/18/2009  Clinical Lists Changes  Observations: Added new observation of MAGNESIUM: 1.9 mg/dL (13/10/6576 46:96) Added new observation of CALCIUM: 9.4 mg/dL (29/52/8413 24:40) Added new observation of CREATININE: 0.67 mg/dL (01/27/2535 64:40) Added new observation of BUN: 11 mg/dL (34/74/2595 63:87) Added new observation of BG RANDOM: 105 mg/dL (56/43/3295 18:84) Added new observation of CO2 PLSM/SER: 30 meq/L (10/18/2009 14:37) Added new observation of CL SERUM: 103 meq/L (10/18/2009 14:37) Added new observation of K SERUM: 5.0 meq/L (10/18/2009 14:37) Added new observation of NA: 142 meq/L (10/18/2009 14:37)

## 2010-05-04 NOTE — Medication Information (Signed)
Summary: RX for cane  RX for cane   Imported By: Jacklynn Ganong 03/01/2010 10:00:40  _____________________________________________________________________  External Attachment:    Type:   Image     Comment:   External Document

## 2010-05-04 NOTE — Medication Information (Signed)
Summary: ccr-lr  Anticoagulant Therapy  Managed by: Vashti Hey, RN PCP: [name] Supervising MD: [name] MD, [name] Indication 1: Atrial Fibrillation Lab Used: [facility] [place] Site: [place] INR POC 1.2  Dietary changes: no    Health status changes: no    Bleeding/hemorrhagic complications: no    Recent/future hospitalizations: no    Any changes in medication regimen? no    Recent/future dental: no  Any missed doses?: yes     Details: denies missing any doses  last INR 2.8  today 1.2  Is patient compliant with meds? yes       Allergies: No Known Drug Allergies  Anticoagulation Management History:      The patient is taking warfarin and comes in today for a routine follow up visit.  She is being anticoagulated because of atrial fibrillation requiring cardioversion.  Negative risk factors for bleeding include an age less than 27 years old, no history of CVA/TIA, no history of GI bleeding, and absence of serious comorbidities.  The bleeding index is 'low risk'.  Positive CHADS2 values include History of HTN.  Negative CHADS2 values include History of CHF, Age > 51 years old, History of Diabetes, and Prior Stroke/CVA/TIA.  The start date was 06/02/2009.  Her last INR was 2.1 ratio.  Anticoagulation responsible Shabana Armentrout: [name] MD, [name].  INR POC: 1.2.  Cuvette Lot#: [id number].  Exp: 12/2010.    Anticoagulation Management Assessment/Plan:      The patient's current anticoagulation dose is Coumadin 5 mg tabs: 7.5mg  once daily except 5mg  on Mondays, Wednesdays and Fridays.  The target INR is 2.0-3.0.  The next INR is due 01/11/2010.  Anticoagulation instructions were given to patient.  Results were reviewed/authorized by Vashti Hey, RN.  She was notified by Vashti Hey RN.         Prior Anticoagulation Instructions: INR 2.8 Continue coumadin 7.5mg  once daily except 5mg  on Mondays, Wednesdays and Fridays  Current Anticoagulation Instructions: INR  1.2 Take coumadin 2 1/2 tablets tonight and tomorrow night then resume 7.5mg  once daily except 5mg  on Mondays, Wednesdays and Fridays Denies missing any doses

## 2010-05-04 NOTE — Medication Information (Signed)
Summary: post hosp protime per Jasmin Martin/tg  Anticoagulant Therapy  Managed by: [name], RN PCP: [name] Supervising MD: [name] MD, [name] Indication 1: Atrial Fibrillation Lab Used: [facility] Jasmin Martin Site: [place] INR POC 2.6  Dietary changes: no    Health status changes: no    Bleeding/hemorrhagic complications: no    Recent/future hospitalizations: no    Any changes in medication regimen? no    Recent/future dental: no  Any missed doses?: no       Is patient compliant with meds? yes       Current Medications (verified): 1)  Alprazolam 1 Mg Tabs (Alprazolam) .... As Needed 2)  Cardizem La 360 Mg Xr24h-Tab (Diltiazem Hcl Coated Beads) .... Take 1 Tab Daily 3)  Coumadin 5 Mg Tabs (Warfarin Sodium) .... Take By Mouth As Directed By Coumadin Clinic 4)  Hydrocodone-Acetaminophen 5-500 Mg Tabs (Hydrocodone-Acetaminophen) .... Take As Needed For Pain 5)  Furosemide 40 Mg Tabs (Furosemide) .... Take One Tablet By Mouth Daily. 6)  Aspir-Low 81 Mg Tbec (Aspirin) .... Take 1 Tab Daily 7)  Metoprolol Succinate 100 Mg Xr24h-Tab (Metoprolol Succinate) .... Take One Tablet By Mouth Daily 8)  Potassium Chloride Crys Cr 20 Meq Cr-Tabs (Potassium Chloride Crys Cr) .... Take 1 Tablet By Mouth Once A Day  Allergies (verified): No Known Drug Allergies  Anticoagulation Management History:      The patient is taking warfarin and comes in today for a routine follow up visit.  She is being anticoagulated due to atrial fibrillation requiring cardioversion.  Negative risk factors for bleeding include an age less than 60 years old, no history of CVA/TIA, no history of GI bleeding, and absence of serious comorbidities.  The bleeding index is 'low risk'.  Positive CHADS2 values include History of HTN.  Negative CHADS2 values include History of CHF, Age > 18 years old, History of Diabetes, and Prior Stroke/CVA/TIA.  The start date was 06/02/2009.  Her last INR was  2.1 ratio.  Anticoagulation responsible provider: [name] MD, [name].  INR POC: 2.6.  Cuvette Lot#: [id number].  Exp: 12/2010.    Anticoagulation Management Assessment/Plan:      The patient's current anticoagulation dose is Coumadin 5 mg tabs: Take by mouth as directed by Coumadin clinic.  The target INR is 2.0-3.0.  The next INR is due 11/16/2009.  Anticoagulation instructions were given to patient.  Results were reviewed/authorized by [name], RN.  She was notified by [name] RN.         Prior Anticoagulation Instructions: INR 2.7 LMOM for pt to call back for dosing. Bethena Midget, RN, BSN  September 21, 2009 2:33 PM  Spoke with pt.  Continue same dose of 5mg  daily except 7.5mg  on Sunday and Thursday.  Recheck INR in office in 3 weeks.   Current Anticoagulation Instructions: INR 2.6 CONTINUE CURRENT DOSE 1.5 TABLETS ON SUNDAYS AND THURSDAYS AND 1 TABLET ALL OTHER DAYS

## 2010-05-04 NOTE — Letter (Signed)
Summary: [facility] Work [facility], agricultural at [employer]  [address], [place] [address]   Phone: [phone]  Fax: [phone]     June 27, 2009 MRN: [id number]   ARRYN TERRONES [address] [place], [place]  [address]      YOUR LAB WORK IS DUE   PLEASE HAVE LABWORK ASAP , NO LATER THAN 07/03/2009  Please go to [facility], located across the street from [facility] on the second floor.  Hours are Monday - Friday 7am until 7:30pm         Saturday 8am until 12noon    __  DO NOT EAT OR DRINK AFTER MIDNIGHT EVENING PRIOR TO LABWORK  _X_ YOUR LABWORK IS NOT FASTING --YOU MAY EAT PRIOR TO LABWORK

## 2010-05-04 NOTE — Progress Notes (Signed)
Summary: PT / INR  Phone Note Other Incoming Call back at [phone]   Caller: Patient Caller: Jasmin Martin w/[facility] Summary of Call: INR 2.7 / PT 32.2 / last [facility] visit / will need to be seen in office next /tg Initial call taken by: Raechel Ache [facility],  September 21, 2009 11:30 AM  Follow-up for Phone Call        See coumacare sheet Follow-up by: Bethena Midget, RN, BSN,  September 21, 2009 2:31 PM

## 2010-05-04 NOTE — Assessment & Plan Note (Signed)
Summary: EPH- POST [facility]E PER [name] 1 MO FU-SRS   Visit Type:  Follow-up Referring Provider:  [name], MD Primary Provider:  [name]   History of Present Illness: [name] returns post hosp for close followup of her PAF.   She remains in afib.  Unfortunately, she has failed medical therapy with both amiodarone and tikosyn.  She is presently treated with a rate control strategy.  She reports ongoing fatigue.  She denies CP, SOB, presyncope, or syncope.  Her Toprol was recently decreased due to fatigue, but without improvement.  She has lost 8 pounds and continues to try to lose weight.  Preventive Screening-Counseling & Management  Alcohol-Tobacco     Smoking Status: never  Current Medications (verified): 1)  Alprazolam 1 Mg Tabs (Alprazolam) .... As Needed 2)  Coumadin 5 Mg Tabs (Warfarin Sodium) .... Take By Mouth As Directed By Coumadin Clinic 3)  Oxycodone-Acetaminophen 5-325 Mg Tabs (Oxycodone-Acetaminophen) .... Take 1 Tablet By Mouth Four Times A Day As Needed 4)  Furosemide 40 Mg Tabs (Furosemide) .... Take One Tablet By Mouth Daily. 5)  Aspir-Low 81 Mg Tbec (Aspirin) .... Take 1 Tab Daily 6)  Metoprolol Succinate 50 Mg Xr24h-Tab (Metoprolol Succinate) .Marland Kitchen.. 1 By Mouth Daily 7)  Lisinopril 20 Mg Tabs (Lisinopril) .... Take 1 Tablet By Mouth Once Daily 8)  Cardizem Cd 120 Mg Xr24h-Cap (Diltiazem Hcl Coated Beads) .... Take 1 Tablet By Mouth Once A Day  Allergies (verified): No Known Drug Allergies  Comments:  Nurse/Medical Assistant: The patient's medication list and allergies were reviewed with the patient and were updated in the Medication and Allergy Lists.  Past History:  Past Medical History: Reviewed history from 08/24/2009 and no changes required. Persistent atrial fibrillation Morbid Obesity HTN DJD denies h/o rheumatic fever  denies h/o diabetes, CVA  Past Surgical History: Reviewed history from 08/24/2009 and no changes required. bilateral  knee surgery hysterectomy tonsillectomy right ankle surgery cardiac cath 1990s, normal per patient Cholecystectomy 2001  Social History: Reviewed history from 08/24/2009 and no changes required. separated and lives in [place] Tobacco Use - No.  Alcohol Use - no Regular Exercise - no Drug Use - no  Review of Systems       All systems are reviewed and negative except as listed in the HPI.   Vital Signs:  Patient profile:   55 year old female Height:      64 inches Weight:      321 pounds Pulse rate:   90 / minute BP sitting:   152 / 79  (left arm) Cuff size:   large  Vitals Entered By: Carlye Grippe (November 11, 2009 8:28 AM)  Physical Exam  General:  obese.   Head:  normocephalic and atraumatic Eyes:  PERRLA/EOM intact; conjunctiva and lids normal. Mouth:  Teeth, gums and palate normal. Oral mucosa normal. Neck:  Neck supple, no JVD. No masses, thyromegaly or abnormal cervical nodes. Lungs:  Clear bilaterally to auscultation and percussion. Heart:  iRRR, no m/r/g Abdomen:  Bowel sounds positive; abdomen soft and non-tender without masses, organomegaly, or hernias noted. No hepatosplenomegaly. Msk:  Back normal, normal gait. Muscle strength and tone normal. Pulses:  pulses normal in all 4 extremities Extremities:  trace left pedal edema and trace right pedal edema.   Neurologic:  Alert and oriented x 3.   EKG  Procedure date:  11/11/2009  Findings:      afib,  V rate 106 bpm, nonspecific ST/T changes  Impression &  Recommendations:  Problem # 1:  FIBRILLATION, ATRIAL (ICD-427.31) The patient has symptomatic atrial fibrillation refractory to multiple medicines including tikosyn and amiodarone.  Presently, her ventricular rates are elevated.  Her INR has been therapeutic.  Therapeutic strategies for afib including medicine and ablation were discussed in detail with the patient today.  Given her refractory afib, I think that her anticipated success rate with catheter  ablation would be 50-60% and would likely require more than one procedure.  She exceeds my typical weight cut off for ablation of 300 lbs.  I have therefore recommended rate control, anticoagulation, and weight loss at this time.  I have increased  cardizem to 240mg  daily today.  IF her HR remains elevated, I would recommend increasing cardizem to 360mg  upon follow-up with Dr Daleen Squibb next week. WE could consider AV nodal ablation and pacemaker implantation as an alternative strategy if she fails rate control with medicines. I will see her again in 4-6 weeks.  Problem # 2:  OBESITY-MORBID (>100') (ICD-278.01) weight loss advised we should consider whether bariatric surgery would be beneficial  Problem # 3:  HYPERTENSION, BENIGN (ICD-401.1)  above goal increase cardizem  Her updated medication list for this problem includes:    Furosemide 40 Mg Tabs (Furosemide) .Marland Kitchen... Take one tablet by mouth daily.    Aspir-low 81 Mg Tbec (Aspirin) .Marland Kitchen... Take 1 tab daily    Metoprolol Succinate 50 Mg Xr24h-tab (Metoprolol succinate) .Marland Kitchen... 1 by mouth daily    Lisinopril 20 Mg Tabs (Lisinopril) .Marland Kitchen... Take 1 tablet by mouth once daily    Diltiazem Hcl Er Beads 240 Mg Xr24h-cap (Diltiazem hcl er beads) .Marland Kitchen... Take one capsule by mouth daily  Other Orders: EKG w/ Interpretation (93000)

## 2010-05-04 NOTE — Assessment & Plan Note (Signed)
Summary: left foot pain xr at [facility] 02/01/10/[facility]   Visit Type:  new patient Referring Provider:  [name], MD Primary Provider:  [name]  CC:  left foot pain.  History of Present Illness: I saw Jasmin Martin in the office today for an initial visit.  She is a 55 years old woman with the complaint of:  left foot pain  Xrays at [facility] on 02-01-10. Lateral soft tissue swelling. No definable osseous or articular pathology in the region.  Medications: Alprazolam 1 mg, Columadia 5 mg, Oxycodone 5 mg, Furosemide, Aspirin 81 gm , Warfarin 5 mg, Diltiazem 240 mg, Lisinopril 20 mg, Potassium.  This is a 29 row female comes to [place] with a complaint of 8/10 sharp throbbing stabbing atraumatic constant pain the lateral border of her LEFT foot associated with some numbness and tingling present for the last 3-4 months.  Ambulation seems to make it worse she is having difficulty wearing a shoe she is already on Percocet and it does not help.  Allergies: No Known Drug Allergies  Past History:  Past Medical History: Persistent atrial fibrillation Morbid Obesity HTN DJD denies h/o rheumatic fever Arthritis denies h/o diabetes, CVA Insomnia Depression Bilateral knees  Past Surgical History: bilateral knee surgery hysterectomy tonsillectomy right ankle surgery cardiac cath 1990s, normal per patient Cholecystectomy 2001 C-section  Review of Systems Constitutional:  Complains of weight gain and fatigue; denies weight loss, fever, and chills. Cardiovascular:  Complains of chest pain; denies palpitations, fainting, and murmurs. Respiratory:  Complains of short of breath and wheezing; denies couch, tightness, pain on inspiration, and snoring . Gastrointestinal:  Complains of diarrhea; denies heartburn, nausea, vomiting, constipation, and blood in your stools. Genitourinary:  Complains of frequency; denies urgency, difficulty urinating, painful urination, flank pain, and  bleeding in urine. Neurologic:  Complains of numbness and tingling; denies unsteady gait, dizziness, tremors, and seizure. Musculoskeletal:  Complains of joint pain and muscle pain; denies swelling, instability, stiffness, redness, and heat. Psychiatric:  Complains of nervousness, depression, and anxiety; denies hallucinations. HEENT:  Complains of watering; denies blurred or double vision, eye pain, and redness. Hemoatologic:  Complains of easy bleeding and brusing.  The review of systems is negative for Endocrine, Skin, and Immunology.  Physical Exam  Additional Exam:  weight 330 pounds height 5 foot 3 inches  Gen. appearance she has an endomorphic body habitus and has some obesity but she is well groomed with normal hygiene and no deformities are present  Cardiovascular: Observation no swelling or edema/palpation normal pulses on the posterior tibial and dorsalis pedis  There is no lymphangitis  Skin is warm dry and intact with no rash or lesion.  Neurological exam shows normal coordination, balance and sensory function LEFT foot.  Psychiatric she is awake alert and oriented x3 her mood and affect are normal  She ambulates with a limp favoring the LEFT side  Inspection reveals that there is mild swelling over the lateral portion of the foot near the fifth metatarsal in its proximal portion and there is tenderness directly over the bone with minimal tenderness at the insertion and no tenderness behind the fibula.  Ankle range of motion is normal as is strength in her ankle is stable.     Impression & Recommendations:  Problem # 1:  STRESS FRACTURE, FOOT (ICD-733.94) Assessment New  Orders: Consultation Level III ([id number])  Problem # 2:  TENOSYNOVITIS OF FOOT AND ANKLE (ICD-727.06) Assessment: New  the x-rays were done at the hospital on November  2 they were negative other than some soft tissue swelling.  She either has a stress fracture or insertional tendinitis of the  peroneal tendons of the LEFT foot and she is advised to wear a Cam Walker with a cane in the RIGHT hand.  She does not have insurance that will cover this so we have ordered one from [employer] and we will call her when it is ready.  The cost coated size was $11.95. She will be charged cost with no up charge  Orders: Consultation Level III [phone])  Patient Instructions: 1)  We will call you when we get the boot in the office and then we will get you to come in to be fitted   Orders Added: 1)  Consultation Level III [60454]

## 2010-05-04 NOTE — Progress Notes (Signed)
**Note De-identified [name] Obfuscation**  **Note De-Identified [name] Obfuscation** Phone Note Call from Patient   Caller: Patient Summary of Call: patient wants to know if Dr.Mcdowell wants her to stay on her 325 asa since he started coumadin would like call back to let her know. [phone]. thankyou,sandy Initial call taken by: Dreama Saa, CNA,  June 03, 2009 9:04 AM  Follow-up for Phone Call        Patient advised to decrease ASA to 81mg  per Dr. Cindra Eves on yesterdays office visit. Follow-up by: Larita Fife Khalif Stender LPN,  June 03, 2009 11:19 AM    New/Updated Medications: ASPIRIN 81 MG TBEC (ASPIRIN) Take one tablet by mouth daily

## 2010-05-04 NOTE — Assessment & Plan Note (Signed)
Summary: 6 wk reck left foot after brace/mcd/bsf   Visit Type:  Follow-up Referring Provider:  [name], MD Primary Provider:  [name]  CC:  recheck foot.  History of Present Illness: I saw Jasmin Martin in the office today for a follow up visit.  She is a 55 years old woman with the complaint of:  left foot pain  Xrays at [facility] on 02-01-10. Lateral soft tissue swelling. No definable osseous or articular pathology in the region.  Medications: Alprazolam 1 mg, Columadia 5 mg, Oxycodone 5 mg, Furosemide, Aspirin 81 gm , Warfarin 5 mg, Diltiazem 240 mg, Lisinopril 20 mg, Potassium.    Followup visit for lateral foot pain.  Treated with brace.  Percocet did not relieve the pain prior to her visit here.  She did have some difficulty wearing the brace when the foot started to throb and swell.  She is currently having 3/10 pain today.  She does use a cane to ambulate.       Allergies: No Known Drug Allergies   Foot/Ankle Exam  General:    normal development and grooming, obese.    Gait:    gait supported by a right-handed cane  Skin:    skin is intact  Inspection:    no swelling is seen, but there is tenderness at the distal portion of the peroneal tendon at the insertion into the metatarsal  Vascular:    dorsalis pedis and posterior tibial pulses 2+ and symmetric, capillary refill < 2 seconds, normal hair pattern, no evidence of ischemia.   Sensory:    gross sensation intact bilaterally in lower extremities.    Motor:    ankle eversion is grade 5 with pain against resistance. There is pain with passive inversion  Ankle Exam:    Left:    Inspection:  Normal    Palpation:  Normal    Stability:  stable  Foot Exam:    Left:    Inspection:  Abnormal    Palpation:  Abnormal    Stability:  stable   Impression & Recommendations:  Problem # 1:  TENOSYNOVITIS OF FOOT AND ANKLE (ICD-727.06) Assessment Comment Only  peroneal  tendinitis.  Recommend ice, Aspercreme rest bracing, when she has a flareup. If this is unsuccessful she will call the office and we will discuss other treatment options, which will include injection  I think stress fracture is ruled out by the area of tenderness, which is in the tendon and not the bone.  Orders: Est. Patient Level III (04540)  Patient Instructions: 1)  PERONEAL TENDONITIS  2)  WHEN FLARE UPS OCCUR:  3)  APPLY ICE 30 MIN EV 2 HRS  4)  APPLY ASPERCREME three times a day  5)  REST  6)  WEAR BRACE FOR WALKING  7)  IF THIS DOES NOT WORK CALL THE OFFICE    Orders Added: 1)  Est. Patient Level III [98119]

## 2010-05-04 NOTE — Miscellaneous (Signed)
Summary: HOSP LABS A1C 05/27/2009  Clinical Lists Changes  Observations: Added new observation of HGBA1C: 5.8 % (05/27/2009 15:17)

## 2010-05-04 NOTE — Progress Notes (Signed)
Summary: coumadin management  Phone Note Other Incoming   Caller: Ernesto Rutherford RN [facility] Reason for Call: Discuss lab or test results Summary of Call: Called with results of PT/INR obtained on pt today.  PT 25.0  INR 2.1  Order given for pt to continue coumadin 5mg  once daily except 7.5mg  on Sundays and Thursdays and [facility] to recheck INR week of 08/29/09. Initial call taken by: Vashti Hey RN,  Aug 15, 2009 11:41 AM     Anticoagulant Therapy  Managed by: Vashti Hey RN PCP: [name] Supervising MD: [name] MD, [name] Indication 1: Atrial Fibrillation Lab Used: [facility] [place] Site: [place] PT 25.0 INR POC 2.1  Dietary changes: no    Health status changes: no    Bleeding/hemorrhagic complications: no    Recent/future hospitalizations: no    Any changes in medication regimen? no    Recent/future dental: no  Any missed doses?: no       Is patient compliant with meds? yes         Anticoagulation Management History:      The patient is taking warfarin and comes in today for a routine follow up visit.  The patient is on warfarin for atrial fibrillation requiring cardioversion.  Negative risk factors for bleeding include an age less than 68 years old, no history of CVA/TIA, no history of GI bleeding, and absence of serious comorbidities.  The bleeding index is 'low risk'.  Positive CHADS2 values include History of HTN.  Negative CHADS2 values include History of CHF, Age > 60 years old, History of Diabetes, and Prior Stroke/CVA/TIA.  The start date was 06/02/2009.  Her last INR was 1.7.  Prothrombin time is 25.0.  Anticoagulation responsible provider: [name] MD, [name].  INR POC: 2.1.  Cuvette Lot#: [id number].    Anticoagulation Management Assessment/Plan:      The patient's current anticoagulation dose is Coumadin 5 mg tabs: Take by mouth as directed by Coumadin clinic.  The target INR is 2.0-3.0.  The next INR is due 08/30/2009.  Anticoagulation  instructions were given to patient.  Results were reviewed/authorized by Vashti Hey RN.  She was notified by Vashti Hey RN.         Prior Anticoagulation Instructions: INR 1.6 Change dose to 5mg  daily except 7.5mg s on Sundays and Thursdays. Recheck in 10 days. Orders given to Ernesto Rutherford with [facility]   Current Anticoagulation Instructions: Called with results of PT/INR obtained on pt today.  PT 25.0  INR 2.1  Order given for pt to continue coumadin 5mg  once daily except 7.5mg  on Sundays and Thursdays and [facility] to recheck INR week of 08/29/09.

## 2010-05-10 ENCOUNTER — Encounter: Payer: Self-pay | Admitting: Cardiology

## 2010-05-10 ENCOUNTER — Encounter (INDEPENDENT_AMBULATORY_CARE_PROVIDER_SITE_OTHER): Payer: Medicaid Other

## 2010-05-10 DIAGNOSIS — I4891 Unspecified atrial fibrillation: Secondary | ICD-10-CM

## 2010-05-10 DIAGNOSIS — Z7901 Long term (current) use of anticoagulants: Secondary | ICD-10-CM

## 2010-05-18 NOTE — Medication Information (Signed)
Summary: ccr-lr [place]  Anticoagulant Therapy Managed by: Vashti Hey, RN Patient Assessment Part 2:  Have you MISSED ANY DOSES or CHANGED TABLETS?  0  Have you had any BRUISING or BLEEDING ( nose or gum bleeds,blood in urine or stool)?  Have you STARTED or STOPPED any MEDICATIONS, including OTC meds,herbals or supplements?  Have you CHANGED your DIET, especially green vegetables,or ALCOHOL intake?  Have you had any ILLNESSES or HOSPITALIZATIONS?  Have you had any signs of CLOTTING?(chest discomfort,dizziness,shortness of breath,arms tingling,slurred speech,swelling or redness in leg)       Regimen Out:    Total Weekly: 45 mg mg  Next INR Due: 06/07/2010      Allergies: No Known Drug Allergies  Anticoagulant Therapy  Managed by: Vashti Hey, RN PCP: [name] Supervising MD: [name] MD, [name] Indication 1: Atrial Fibrillation Lab Used: [facility] [place] Site: [place] INR POC 2.4  Dietary changes: no    Health status changes: no    Bleeding/hemorrhagic complications: no    Recent/future hospitalizations: no    Any changes in medication regimen? no    Recent/future dental: no  Any missed doses?: no       Is patient compliant with meds? yes         Anticoagulation Management History:      The patient is taking warfarin and comes in today for a routine follow up visit.  Anticoagulation is being administered due to atrial fibrillation requiring cardioversion.  Negative risk factors for bleeding include an age less than 56 years old, no history of CVA/TIA, no history of GI bleeding, and absence of serious comorbidities.  The bleeding index is 'low risk'.  Positive CHADS2 values include History of HTN.  Negative CHADS2 values include History of CHF, Age > 90 years old, History of Diabetes, and Prior Stroke/CVA/TIA.  The start date was 06/02/2009.  Her last INR was 2.1 ratio.  Anticoagulation responsible provider: [name] MD, [name].   INR POC: 2.4.  Cuvette Lot#: [id number].  Exp: 12/2010.    Anticoagulation Management Assessment/Plan:      The patient's current anticoagulation dose is Coumadin 5 mg tabs: 7.5mg  once daily except 5mg  on Mondays, Wednesdays and Fridays.  The target INR is 2.0-3.0.  The next INR is due 06/07/2010.  Anticoagulation instructions were given to patient.  Results were reviewed/authorized by Vashti Hey, RN.  She was notified by Vashti Hey RN.         Prior Anticoagulation Instructions: INR 1.8 Take coumadin 2 tablets tonight then resume 1 /2 tablets once daily except 1 tablet on Mondays, Wednesdays and Fridays  Current Anticoagulation Instructions: INR 2.4 Continue coumadin 7.5mg  once daily except 5mg  on Mondays, Wednesdays and Fridays

## 2010-06-07 ENCOUNTER — Encounter: Payer: Self-pay | Admitting: Cardiovascular Disease

## 2010-06-07 ENCOUNTER — Encounter (INDEPENDENT_AMBULATORY_CARE_PROVIDER_SITE_OTHER): Payer: Medicaid Other

## 2010-06-07 DIAGNOSIS — Z7901 Long term (current) use of anticoagulants: Secondary | ICD-10-CM

## 2010-06-07 DIAGNOSIS — I4891 Unspecified atrial fibrillation: Secondary | ICD-10-CM

## 2010-06-13 NOTE — Medication Information (Signed)
Summary: ccr-lr  Anticoagulant Therapy  Managed by: Vashti Hey, RN PCP: [name] Supervising MD: Eden Emms MD, Theron Arista Indication 1: Atrial Fibrillation Lab Used: [facility] [place] Site: [place] INR POC 2.5  Dietary changes: no    Health status changes: no    Bleeding/hemorrhagic complications: no    Recent/future hospitalizations: no    Any changes in medication regimen? no    Recent/future dental: no  Any missed doses?: no       Is patient compliant with meds? yes       Allergies: No Known Drug Allergies  Anticoagulation Management History:      The patient is taking warfarin and comes in today for a routine follow up visit.  Anticoagulation is being administered due to atrial fibrillation requiring cardioversion.  Negative risk factors for bleeding include an age less than 53 years old, no history of CVA/TIA, no history of GI bleeding, and absence of serious comorbidities.  The bleeding index is 'low risk'.  Positive CHADS2 values include History of HTN.  Negative CHADS2 values include History of CHF, Age > 32 years old, History of Diabetes, and Prior Stroke/CVA/TIA.  The start date was 06/02/2009.  Her last INR was 2.1 ratio.  Anticoagulation responsible provider: Eden Emms MD, Theron Arista.  INR POC: 2.5.  Cuvette Lot#: [id number].  Exp: 12/2010.    Anticoagulation Management Assessment/Plan:      The patient's current anticoagulation dose is Coumadin 5 mg tabs: 7.5mg  once daily except 5mg  on Mondays, Wednesdays and Fridays.  The target INR is 2.0-3.0.  The next INR is due 07/05/2010.  Anticoagulation instructions were given to patient.  Results were reviewed/authorized by Vashti Hey, RN.  She was notified by Vashti Hey RN.         Prior Anticoagulation Instructions: INR 2.4 Continue coumadin 7.5mg  once daily except 5mg  on Mondays, Wednesdays and Fridays  Current Anticoagulation Instructions: INR 2.5 Continue coumadin 7.5mg  once daily except 5mg  on Mondays,  Wednesdays and Fridays

## 2010-06-17 LAB — POCT CARDIAC MARKERS
CKMB, poc: 1.2 ng/mL (ref 1.0–8.0)
CKMB, poc: 1.6 ng/mL (ref 1.0–8.0)
Troponin i, poc: <0.05 ng/mL (ref 0.00–0.09)

## 2010-06-17 LAB — BASIC METABOLIC PANEL
BUN: 9 mg/dL (ref 6–23)
CO2: 31 mEq/L (ref 19–32)
Chloride: 102 mEq/L (ref 96–112)
Creatinine, Ser: 0.69 mg/dL (ref 0.4–1.2)
GFR calc Af Amer: >60 mL/min (ref >60)
GFR calc non Af Amer: >60 mL/min (ref >60)
GFR calc non Af Amer: >60 mL/min (ref >60)
Glucose, Bld: 122 mg/dL — ABNORMAL HIGH (ref 70–99)
Potassium: 3.7 mEq/L (ref 3.5–5.1)
Potassium: 4 mEq/L (ref 3.5–5.1)
Sodium: 140 mEq/L (ref 135–145)

## 2010-06-17 LAB — HEPARIN LEVEL (UNFRACTIONATED)
Heparin Unfractionated: 0.29 IU/mL — ABNORMAL LOW (ref 0.30–0.70)
Heparin Unfractionated: 0.49 IU/mL (ref 0.30–0.70)
Heparin Unfractionated: 0.68 IU/mL (ref 0.30–0.70)

## 2010-06-17 LAB — CBC
Hemoglobin: 15.4 g/dL — ABNORMAL HIGH (ref 12.0–15.0)
MCH: 32.8 pg (ref 26.0–34.0)
MCH: 33.4 pg (ref 26.0–34.0)
MCHC: 34 g/dL (ref 30.0–36.0)
MCHC: 34.1 g/dL (ref 30.0–36.0)
MCHC: 34.3 g/dL (ref 30.0–36.0)
MCV: 97.5 fL (ref 78.0–100.0)
Platelets: 225 10*3/uL (ref 150–400)
Platelets: 256 10*3/uL (ref 150–400)
RDW: 13.5 % (ref 11.5–15.5)

## 2010-06-17 LAB — PROTIME-INR
INR: 1.85 — ABNORMAL HIGH (ref 0.00–1.49)
INR: 2.08 — ABNORMAL HIGH (ref 0.00–1.49)
Prothrombin Time: 21.1 seconds — ABNORMAL HIGH (ref 11.6–15.2)
Prothrombin Time: 21.2 seconds — ABNORMAL HIGH (ref 11.6–15.2)
Prothrombin Time: 22.7 seconds — ABNORMAL HIGH (ref 11.6–15.2)
Prothrombin Time: 23.2 seconds — ABNORMAL HIGH (ref 11.6–15.2)

## 2010-06-17 LAB — DIFFERENTIAL
Basophils Relative: 0 % (ref 0–1)
Eosinophils Absolute: 0.2 10*3/uL (ref 0.0–0.7)
Monocytes Relative: 8 % (ref 3–12)
Neutrophils Relative %: 58 % (ref 43–77)

## 2010-06-17 LAB — POCT I-STAT, CHEM 8
Calcium, Ion: 1.07 mmol/L — ABNORMAL LOW (ref 1.12–1.32)
Glucose, Bld: 128 mg/dL — ABNORMAL HIGH (ref 70–99)
HCT: 46 % (ref 36.0–46.0)
Hemoglobin: 15.6 g/dL — ABNORMAL HIGH (ref 12.0–15.0)
Potassium: 3.7 mEq/L (ref 3.5–5.1)

## 2010-06-17 LAB — MAGNESIUM: Magnesium: 2 mg/dL (ref 1.5–2.5)

## 2010-06-18 LAB — BASIC METABOLIC PANEL WITH GFR
BUN: 13 mg/dL (ref 6–23)
BUN: 9 mg/dL (ref 6–23)
BUN: 11 mg/dL (ref 6–23)
CO2: 29 meq/L (ref 19–32)
CO2: 31 meq/L (ref 19–32)
CO2: 26 meq/L (ref 19–32)
Calcium: 8.4 mg/dL (ref 8.4–10.5)
Calcium: 8.4 mg/dL (ref 8.4–10.5)
Calcium: 8.2 mg/dL — ABNORMAL LOW (ref 8.4–10.5)
Chloride: 101 meq/L (ref 96–112)
Chloride: 102 meq/L (ref 96–112)
Chloride: 105 meq/L (ref 96–112)
Creatinine, Ser: 0.72 mg/dL (ref 0.4–1.2)
Creatinine, Ser: 0.79 mg/dL (ref 0.4–1.2)
Creatinine, Ser: 0.63 mg/dL (ref 0.4–1.2)
GFR calc non Af Amer: >60 mL/min
GFR calc non Af Amer: >60 mL/min
GFR calc non Af Amer: >60 mL/min
Glucose, Bld: 107 mg/dL — ABNORMAL HIGH (ref 70–99)
Glucose, Bld: 108 mg/dL — ABNORMAL HIGH (ref 70–99)
Glucose, Bld: 100 mg/dL — ABNORMAL HIGH (ref 70–99)
Potassium: 3.6 meq/L (ref 3.5–5.1)
Potassium: 3.9 meq/L (ref 3.5–5.1)
Potassium: 3.8 meq/L (ref 3.5–5.1)
Sodium: 138 meq/L (ref 135–145)
Sodium: 140 meq/L (ref 135–145)
Sodium: 138 meq/L (ref 135–145)

## 2010-06-18 LAB — BASIC METABOLIC PANEL
BUN: 12 mg/dL (ref 6–23)
BUN: 14 mg/dL (ref 6–23)
CO2: 31 mEq/L (ref 19–32)
CO2: 28 mEq/L (ref 19–32)
Calcium: 8.7 mg/dL (ref 8.4–10.5)
Chloride: 105 mEq/L (ref 96–112)
Chloride: 106 mEq/L (ref 96–112)
Chloride: 106 mEq/L (ref 96–112)
Creatinine, Ser: 0.76 mg/dL (ref 0.4–1.2)
Creatinine, Ser: 0.68 mg/dL (ref 0.4–1.2)
GFR calc non Af Amer: >60 mL/min (ref >60)
Glucose, Bld: 101 mg/dL — ABNORMAL HIGH (ref 70–99)
Glucose, Bld: 95 mg/dL (ref 70–99)
Potassium: 3.8 mEq/L (ref 3.5–5.1)
Sodium: 141 mEq/L (ref 135–145)
Sodium: 142 mEq/L (ref 135–145)

## 2010-06-18 LAB — PROTIME-INR
INR: 2.23 — ABNORMAL HIGH (ref 0.00–1.49)
INR: 2.61 — ABNORMAL HIGH (ref 0.00–1.49)
INR: 3.2 — ABNORMAL HIGH (ref 0.00–1.49)
INR: 2.45 — ABNORMAL HIGH (ref 0.00–1.49)
INR: 2.5 — ABNORMAL HIGH (ref 0.00–1.49)
Prothrombin Time: 24.5 s — ABNORMAL HIGH (ref 11.6–15.2)
Prothrombin Time: 30.6 seconds — ABNORMAL HIGH (ref 11.6–15.2)
Prothrombin Time: 26.4 seconds — ABNORMAL HIGH (ref 11.6–15.2)
Prothrombin Time: 26.8 seconds — ABNORMAL HIGH (ref 11.6–15.2)

## 2010-06-18 LAB — AMIODARONE LEVEL
Amiodarone Lvl: <0.3 ug/mL — ABNORMAL LOW (ref 1.5–2.5)
N-Desethyl-Amiodarone: <0.3 ug/mL — ABNORMAL LOW (ref 1.5–2.5)

## 2010-06-20 LAB — DIFFERENTIAL
Basophils Absolute: 0 10*3/uL (ref 0.0–0.1)
Eosinophils Relative: 2 % (ref 0–5)
Lymphocytes Relative: 33 % (ref 12–46)
Lymphs Abs: 2.7 10*3/uL (ref 0.7–4.0)
Neutro Abs: 4.7 10*3/uL (ref 1.7–7.7)

## 2010-06-20 LAB — BASIC METABOLIC PANEL
BUN: 6 mg/dL (ref 6–23)
Calcium: 9.4 mg/dL (ref 8.4–10.5)
GFR calc non Af Amer: >60 mL/min (ref >60)
Potassium: 3.7 mEq/L (ref 3.5–5.1)
Sodium: 143 mEq/L (ref 135–145)

## 2010-06-20 LAB — PROTIME-INR
INR: 2.5 — ABNORMAL HIGH (ref 0.00–1.49)
Prothrombin Time: 26.1 seconds — ABNORMAL HIGH (ref 11.6–15.2)

## 2010-06-20 LAB — POCT CARDIAC MARKERS
CKMB, poc: <1 ng/mL — ABNORMAL LOW (ref 1.0–8.0)
Troponin i, poc: <0.05 ng/mL (ref 0.00–0.09)

## 2010-06-20 LAB — COMPREHENSIVE METABOLIC PANEL
ALT: 37 U/L — ABNORMAL HIGH (ref 0–35)
Albumin: 3.1 g/dL — ABNORMAL LOW (ref 3.5–5.2)
Alkaline Phosphatase: 83 U/L (ref 39–117)
GFR calc Af Amer: >60 mL/min (ref >60)
Potassium: 3.5 mEq/L (ref 3.5–5.1)
Sodium: 139 mEq/L (ref 135–145)
Total Protein: 6.2 g/dL (ref 6.0–8.3)

## 2010-06-20 LAB — CARDIAC PANEL(CRET KIN+CKTOT+MB+TROPI)
CK, MB: 0.9 ng/mL (ref 0.3–4.0)
Relative Index: INVALID (ref 0.0–2.5)
Total CK: 50 U/L (ref 7–177)
Troponin I: 0.01 ng/mL (ref 0.00–0.06)

## 2010-06-20 LAB — CBC
HCT: 41.1 % (ref 36.0–46.0)
Hemoglobin: 14.3 g/dL (ref 12.0–15.0)
Platelets: 263 10*3/uL (ref 150–400)
RDW: 13.5 % (ref 11.5–15.5)
WBC: 8.3 10*3/uL (ref 4.0–10.5)

## 2010-06-20 LAB — APTT: aPTT: 34 seconds (ref 24–37)

## 2010-06-21 LAB — CBC
MCHC: 33 g/dL (ref 30.0–36.0)
MCV: 94.4 fL (ref 78.0–100.0)
Platelets: 211 10*3/uL (ref 150–400)
RBC: 4.86 MIL/uL (ref 3.87–5.11)
RDW: 13.5 % (ref 11.5–15.5)
WBC: 17.8 10*3/uL — ABNORMAL HIGH (ref 4.0–10.5)
WBC: 24.2 10*3/uL — ABNORMAL HIGH (ref 4.0–10.5)

## 2010-06-21 LAB — BASIC METABOLIC PANEL
BUN: 17 mg/dL (ref 6–23)
Chloride: 103 mEq/L (ref 96–112)
Creatinine, Ser: 0.85 mg/dL (ref 0.4–1.2)
GFR calc non Af Amer: >60 mL/min (ref >60)
Glucose, Bld: 162 mg/dL — ABNORMAL HIGH (ref 70–99)

## 2010-06-21 LAB — COMPREHENSIVE METABOLIC PANEL
ALT: 49 U/L — ABNORMAL HIGH (ref 0–35)
AST: 45 U/L — ABNORMAL HIGH (ref 0–37)
CO2: 21 mEq/L (ref 19–32)
Calcium: 8.7 mg/dL (ref 8.4–10.5)
GFR calc Af Amer: >60 mL/min (ref >60)
GFR calc non Af Amer: >60 mL/min (ref >60)
Sodium: 136 mEq/L (ref 135–145)

## 2010-06-21 LAB — URINE MICROSCOPIC-ADD ON

## 2010-06-21 LAB — CULTURE, BLOOD (ROUTINE X 2)
Culture: NO GROWTH
Report Status: 2262011
Report Status: 2262011

## 2010-06-21 LAB — GLUCOSE, CAPILLARY
Glucose-Capillary: 109 mg/dL — ABNORMAL HIGH (ref 70–99)
Glucose-Capillary: 112 mg/dL — ABNORMAL HIGH (ref 70–99)
Glucose-Capillary: 119 mg/dL — ABNORMAL HIGH (ref 70–99)
Glucose-Capillary: 140 mg/dL — ABNORMAL HIGH (ref 70–99)
Glucose-Capillary: 141 mg/dL — ABNORMAL HIGH (ref 70–99)

## 2010-06-21 LAB — HEMOGLOBIN A1C
Hgb A1c MFr Bld: 5.8 % (ref 4.6–6.1)
Mean Plasma Glucose: 120 mg/dL

## 2010-06-21 LAB — URINE CULTURE: Culture: NO GROWTH

## 2010-06-21 LAB — CARDIAC PANEL(CRET KIN+CKTOT+MB+TROPI)
Relative Index: INVALID (ref 0.0–2.5)
Total CK: 36 U/L (ref 7–177)
Total CK: 47 U/L (ref 7–177)
Troponin I: 0.03 ng/mL (ref 0.00–0.06)

## 2010-06-21 LAB — DIFFERENTIAL
Basophils Absolute: 0 10*3/uL (ref 0.0–0.1)
Basophils Relative: 0 % (ref 0–1)
Eosinophils Absolute: 0 10*3/uL (ref 0.0–0.7)
Lymphocytes Relative: 8 % — ABNORMAL LOW (ref 12–46)
Lymphs Abs: 1.9 10*3/uL (ref 0.7–4.0)
Monocytes Relative: 8 % (ref 3–12)
Neutro Abs: 20.4 10*3/uL — ABNORMAL HIGH (ref 1.7–7.7)
Neutrophils Relative %: 84 % — ABNORMAL HIGH (ref 43–77)

## 2010-06-21 LAB — URINALYSIS, ROUTINE W REFLEX MICROSCOPIC
Nitrite: POSITIVE — AB
Specific Gravity, Urine: >1.03 — ABNORMAL HIGH (ref 1.005–1.030)
Urobilinogen, UA: 0.2 mg/dL (ref 0.0–1.0)

## 2010-06-21 LAB — POCT CARDIAC MARKERS
CKMB, poc: <1 ng/mL — ABNORMAL LOW (ref 1.0–8.0)
Troponin i, poc: <0.05 ng/mL (ref 0.00–0.09)

## 2010-06-21 LAB — TSH: TSH: 0.471 u[IU]/mL (ref 0.350–4.500)

## 2010-06-30 ENCOUNTER — Encounter: Payer: Self-pay | Admitting: Cardiology

## 2010-06-30 DIAGNOSIS — Z7901 Long term (current) use of anticoagulants: Secondary | ICD-10-CM | POA: Insufficient documentation

## 2010-06-30 DIAGNOSIS — I4891 Unspecified atrial fibrillation: Secondary | ICD-10-CM

## 2010-07-05 ENCOUNTER — Encounter: Payer: Medicaid Other | Admitting: *Deleted

## 2010-07-09 LAB — COMPREHENSIVE METABOLIC PANEL
AST: 27 U/L (ref 0–37)
BUN: 11 mg/dL (ref 6–23)
CO2: 30 mEq/L (ref 19–32)
Calcium: 9 mg/dL (ref 8.4–10.5)
Chloride: 109 mEq/L (ref 96–112)
Creatinine, Ser: 0.63 mg/dL (ref 0.4–1.2)
GFR calc Af Amer: >60 mL/min (ref >60)
GFR calc non Af Amer: >60 mL/min (ref >60)
Total Bilirubin: 0.5 mg/dL (ref 0.3–1.2)

## 2010-07-09 LAB — CBC
HCT: 40.7 % (ref 36.0–46.0)
MCHC: 35.4 g/dL (ref 30.0–36.0)
MCV: 93.6 fL (ref 78.0–100.0)
Platelets: 246 10*3/uL (ref 150–400)
RBC: 4.35 MIL/uL (ref 3.87–5.11)
WBC: 6.9 10*3/uL (ref 4.0–10.5)

## 2010-07-09 LAB — POCT CARDIAC MARKERS
CKMB, poc: <1 ng/mL — ABNORMAL LOW (ref 1.0–8.0)
Myoglobin, poc: 41.4 ng/mL (ref 12–200)
Troponin i, poc: <0.05 ng/mL (ref 0.00–0.09)

## 2010-07-09 LAB — URINALYSIS, ROUTINE W REFLEX MICROSCOPIC
Ketones, ur: NEGATIVE mg/dL
Protein, ur: NEGATIVE mg/dL
Urobilinogen, UA: 0.2 mg/dL (ref 0.0–1.0)

## 2010-07-09 LAB — DIFFERENTIAL
Basophils Absolute: 0 10*3/uL (ref 0.0–0.1)
Lymphocytes Relative: 38 % (ref 12–46)
Lymphs Abs: 2.7 10*3/uL (ref 0.7–4.0)
Neutro Abs: 3.5 10*3/uL (ref 1.7–7.7)

## 2010-07-09 LAB — URINE MICROSCOPIC-ADD ON

## 2010-07-12 ENCOUNTER — Ambulatory Visit (INDEPENDENT_AMBULATORY_CARE_PROVIDER_SITE_OTHER): Payer: Medicaid Other | Admitting: *Deleted

## 2010-07-12 ENCOUNTER — Encounter: Payer: Medicaid Other | Admitting: *Deleted

## 2010-07-12 DIAGNOSIS — Z7901 Long term (current) use of anticoagulants: Secondary | ICD-10-CM

## 2010-07-12 DIAGNOSIS — I4891 Unspecified atrial fibrillation: Secondary | ICD-10-CM

## 2010-07-12 LAB — POCT INR: INR: 1.6

## 2010-07-27 ENCOUNTER — Ambulatory Visit (INDEPENDENT_AMBULATORY_CARE_PROVIDER_SITE_OTHER): Payer: Medicaid Other | Admitting: *Deleted

## 2010-07-27 DIAGNOSIS — Z7901 Long term (current) use of anticoagulants: Secondary | ICD-10-CM

## 2010-07-27 DIAGNOSIS — I4891 Unspecified atrial fibrillation: Secondary | ICD-10-CM

## 2010-08-18 NOTE — Op Note (Signed)
NAME:  Jasmin Martin, Jasmin Martin                           ACCOUNT NO.:  [id number]   MEDICAL RECORD NO.:  [id number]                   PATIENT TYPE:   LOCATION:                                       FACILITY:  [facility]   PHYSICIAN:  [id number]                                DATE OF BIRTH:  1955-10-09   DATE OF PROCEDURE:  12/19/2001  DATE OF DISCHARGE:                                  PROCEDURE NOTE   PROCEDURE:  Dobutamine Cardiolite.   BRIEF HISTORY:  The patient is a pleasant 55 year old female who was  admitted to [facility] on December 18, 2001 for evaluation of  chest pain and tachypalpitations.  She has a history of hypertension,  elevated lipids, asthma, hypothyroidism, a history of anxiety and  depression, and she had carotid bruits noted on exam.  Her potassium was 3.5  on admission.  Cardiac enzymes were performed to rule out MI.  A 2-D echo  was performed and the results are pending at time of this dictation.  She  was scheduled for an adenosine Cardiolite.  She was discharged later in day  on December 18, 2001 and returned today as an outpatient for her adenosine  Cardiolite.   After arrival it was noted that she had some wheezing.  As noted, she does  have a history of asthma and uses an inhaler at home.  A decision was made  to change her to a dobutamine Cardiolite.   The patient felt fine prior to the study.  Her baseline EKG showed sinus  rhythm, rate 62 beats per minute with some nonspecific changes.  Blood  pressure was 120/80.   DESCRIPTION OF PROCEDURE:  Dobutamine was started at 10 mcg.  After  approximately three minutes it was increased to 20 mcg.  At that time the  patient developed a supraventricular tachycardia with a rate as high as 170s  to 180s.  She felt very uncomfortable with this rhythm.  The Cardiolite was  injected at this time; the dobutamine was stopped.  The patient remained in  the fast heart rate.  Dr. Daleen Squibb was contacted and came to evaluate the  patient.  We kept her on the monitor.  After several minutes she went back  into sinus rhythm and her rate dropped to the 80 range.  She later had  several further episodes of SVT but returned to normal sinus rhythm.  There  were nonspecific ST-T wave abnormalities during the fast heart rate and the  patient did feel short of breath.   A decision was made to set the patient up to see Dr. Lewayne Bunting to further  evaluate her for her supraventricular tachycardia.  She is on Verelan at  this time which she takes at bedtime - the dosage is not available  currently.  She was told to contact the office if she continues to have  these episodes and we will discuss increasing her medication.  She had been  scheduled for an event monitor; however, this will be cancelled as we now  have documentation regarding her arrhythmia.  The final images are pending  at the time of this dictation and, as noted, she will follow up with Dr.  Lewayne Bunting for further evaluation of supraventricular tachycardia.     Delton See, P.A. [facility]                  [id number]   DR/MEDQ  D:  12/19/2001  T:  12/22/2001  Job:  [id number]   cc:   Ramon Dredge L. Juanetta Gosling, M.D.

## 2010-08-18 NOTE — H&P (Signed)
NAME:  Jasmin Martin, Jasmin Martin                           ACCOUNT NO.:  [id number]   MEDICAL RECORD NO.:  [id number]                   PATIENT TYPE:  INP   LOCATION:  A320                                 FACILITY:  [facility]   PHYSICIAN:  Gracelyn Nurse, M.D.              DATE OF BIRTH:  March 03, 1956   DATE OF ADMISSION:  08/18/2002  DATE OF DISCHARGE:                                HISTORY & PHYSICAL   CHIEF COMPLAINT:  Nausea and vomiting.   HISTORY OF PRESENT ILLNESS:  This is a 55 year old white female who presents  with nausea and vomiting.  Her symptoms came on suddenly today.  She did not  have any abdominal pain.  She just says now her abdomen feels sore from the  vomiting.  She has not eaten out, had any unusual foods, and she does not  claim to have any sick contacts.  She is not having any hematemesis or any  dark tarry stools.   PAST MEDICAL HISTORY:  1. History of SVT.  2. Hypothyroidism.  3. Hypertension.  4. Hyperlipidemia.   ALLERGIES:  No known drug allergies.   CURRENT MEDICATIONS:  She cannot recall the names of them or dosages, but  she takes a blood pressure medication, one for cholesterol, one for  hypothyroidism, and a heart pill to control her heart rate.  She will have  her family bring those in in the morning.   SOCIAL HISTORY:  She is married and has one child.  She does not smoke.  Does not drink any alcohol.   FAMILY HISTORY:  She has a mother who has diabetes.  Her brother has had an  MI at age 29.   REVIEW OF SYSTEMS:  As per HPI.  Remainder of symptoms is negative.   PHYSICAL EXAMINATION:  VITAL SIGNS:  Temperature is 97.9, pulse 77,  respirations 20, blood pressure 123/56.  GENERAL:  This is an obese white female in no acute distress.  HEENT:  Pupils are equal, round, and reactive to light.  Extraocular  movements intact.  Oral mucosa is dry.  Oropharynx is clear.  CARDIOVASCULAR:  Regular rate and rhythm.  With no murmurs.  LUNGS:  Clear to  auscultation.  ABDOMEN:  Obese, nontender, nondistended.  Bowel sounds are positive.  EXTREMITIES:  With 1+ lower extremity edema.  NEUROLOGIC:  Cranial nerves II-XII grossly intact.  SKIN:  Moist.  With no rash.   ADMISSION LABORATORY DATA:  White cell count 14.6, hemoglobin is 14.8,  platelets are 308.  Amylase 29, lipase 42.  Sodium is 138, potassium 4.8,  chloride 101, CO2 27, BUN 11, creatinine 0.9, glucose is 145.   ASSESSMENT AND PLAN:  1. Gastroenteritis.  This is likely viral.  Will give her supportive care     with intravenous fluids and antiemetics as needed.  Will make her n.p.o.     with just sips and chips  for now.  May try her on a liquid diet in the     morning.  2. Hyperglycemia.  She has no history of diabetes.  This is likely stress-     induced.  We will go ahead and check a hemoglobin A1C.  3. Hypertension.  She has already taken her medications for today, and will     have her family bring in the names of her medications for tomorrow.  4. Hypothyroidism.  Will continue her Synthroid when we find out her dose.  5. History of supraventricular tachycardia.  She is on some type of     medication, suspect a beta blocker, however.  When we get that medication     from home we will restart her on that.                                               Gracelyn Nurse, M.D.    JDJ/MEDQ  D:  08/19/2002  T:  08/19/2002  Job:  [id number]

## 2010-08-18 NOTE — Group Therapy Note (Signed)
   NAME:  Jasmin Martin, Jasmin Martin                           ACCOUNT NO.:  [id number]   MEDICAL RECORD NO.:  [id number]                   PATIENT TYPE:  INP   LOCATION:  A320                                 FACILITY:  [facility]   PHYSICIAN:  Edward L. Juanetta Gosling, M.D.             DATE OF BIRTH:  14-Sep-1955   DATE OF PROCEDURE:  08/20/2002  DATE OF DISCHARGE:                                   PROGRESS NOTE   PROBLEMS:  1. Probable viral gastroenteritis.  2. History of supraventricular tachycardia.  3. Hypothyroidism.  4. Hypertension.  5. Hyperlipidemia.   SUBJECTIVE:  The patient says she is feeling a little bit better.  She is  not nauseated now but she has had diarrhea.   OBJECTIVE:  Her physical exam today shows her temperature is 97.7, pulse 65,  respirations 20, blood pressure 115/55.  Her abdomen is soft without masses,  bowel sounds are slightly hyperactive, I&O is -3.  Her white blood count  yesterday was down to 9600 from the original 14,600 and her electrolytes  yesterday were normal with the exception of her glucose of 145.   ASSESSMENT:  She does appear to be much better but having diarrhea.   PLAN:  Add antidiarrheal medications.  Continue with her other treatments.  I am hopeful that she will be able to be discharged tomorrow if she  continues to improve.                                               Edward L. Juanetta Gosling, M.D.    ELH/MEDQ  D:  08/20/2002  T:  08/20/2002  Job:  [id number]

## 2010-08-18 NOTE — Group Therapy Note (Signed)
   NAME:  Jasmin Martin, Jasmin Martin                           ACCOUNT NO.:  [id number]   MEDICAL RECORD NO.:  [id number]                   PATIENT TYPE:  INP   LOCATION:  A201                                 FACILITY:  [facility]   PHYSICIAN:  Fredirick Maudlin, M.D.              DATE OF BIRTH:  25-Feb-1956   DATE OF PROCEDURE:  DATE OF DISCHARGE:                                   PROGRESS NOTE   PROBLEM:  Chest pain.   SUBJECTIVE:  The patient has come in last night with substernal chest pain.  She has had a previous episode of palpitations and had seen [facility] about this.  She says that the pain that she had last night was  severe in the middle of her chest and felt like someone was pushing on her  chest.  She took three nitroglycerin and it finally resolved.  Thus far, her  cardiac enzymes are without any evidence of cardiac disease.  Her D-dimer is  negative, arterial blood gas normal, so I do not think there is a  significant chance that this represents pulmonary embolism.   PLAN:  The plan is to for cardiology consultation.  I am not certain if what  the approach is going to be with her.  She did have fairly severe chest  pain, has multiple risk factors, and required nitroglycerin to resolve her  pain.                                                  Fredirick Maudlin, M.D.    ELH/MEDQ  D:  12/18/2001  T:  12/22/2001  Job:  [id number]

## 2010-08-18 NOTE — H&P (Signed)
Jasmin Martin, GURAL                 ACCOUNT NO.:  [id number]   MEDICAL RECORD NO.:  [id number]            PATIENT TYPE:   LOCATION:                                 FACILITY:   PHYSICIAN:  R. Roetta Sessions, M.D. DATE OF BIRTH:  08/11/1955   DATE OF ADMISSION:  DATE OF DISCHARGE:  LH                              HISTORY & PHYSICAL   REASON FOR CONSULTATION:  Diarrhea, hematochezia.   The patient is a pleasant 55 year old morbidly obese Caucasian female  _followed primarily by Dr._________  Jasmin Martin to further evaluate  exacerbation of chronic diarrhea.  She has had intermittent blood per  rectum and 4 to 5, sometimes as much as 6 loose bowel movements daily.  She sees gross blood with stooling, and it is not always mixed in with  the loose bowel movement itself.  She tells me she has had diarrhea for  30 years.  I saw this lady back in 1998 for diarrhea.  She had a limited  workup.  She had a sigmoidoscopy which demonstrated formed stool in her  rectum.  She did have some yeast on stool studies, but nothing else.  She was referred to me back in 2004 for hematochezia in the hope she was  going to get a colonoscopy, however, she did not keep her appointment  she tells me back at that time because of her mother's murder.  This  fouled her up and she really has not been taking care of herself until  now, when she has reactivated evaluation of her diarrhea.  There is no  family history of inflammatory bowel disease or colorectal cancer.  She  has never had her entire lower GI tract evaluated.  She does not have  any upper GI tract symptoms such as odynophagia, dysphagia, early  _satiety_________, reflux symptoms.  I do note that she has GAINED 54  pounds since she was seen in 1998.  She did have a small bowel follow  through back at that time which was negative.   PAST MEDICAL HISTORY:  1. Hypertension.  2. Coronary heart disease with arrhythmia.  3. Hypothyroidism.  4. Anxiety  neurosis.   PAST SURGERY:  1. Bilateral knee surgery.  2. Cholecystectomy.  3. Hysterectomy.  4. C-section.  5. Tonsillectomy.   CURRENT MEDICATIONS:  1. Xanax 1 mg t.i.d.  2. Amlodipine 5/20 once daily.  3. Levothyroxine 80 mg daily.  4. Flecainide 50 mg every 12 hours.  5. Imodium 80 p.r.n.   ALLERGIES:  NO KNOWN DRUG ALLERGIES.   FAMILY HISTORY:  Negative for chronic GI or liver disease.  Mother  murdered at age 47.  Father died at age 23 with lung cancer.  No history  of chronic GI or liver illness.   SOCIAL HISTORY:  Patient is married.  Has one child.  She is disabled.  No tobacco.  No alcohol.   REVIEW OF SYSTEMS:  As in history of present illness.  No fever, chills,  chest pain, dyspnea.  She has diarrhea all the time.  She is not  awakened in the middle  of night with diarrhea.  Occasionally she has  bouts of fecal incontinence.  She does not recall ever having a formed  stool.   PHYSICAL EXAMINATION:  Pleasant, morbidly obese 54 year old lady.  Weight 360 pounds, height 5 feet 4, temp 97.8, BP 142/68, pulse 74.  SKIN:  Warm and dry.  There is no jaundice.  No continued stigmata of  chronic liver disease.  HEENT:  No scleral icterus.  Conjunctivae are pink.  CHEST:  Lungs are clear to auscultation.  CARDIAC:  Regular rate and rhythm without murmur, gallop, rub.  ABDOMEN:  Massively obese.  Positive bowel sounds.  Soft, nontender.  No  obvious mass or organomegaly.  RECTAL:  Deferred till time of  colonoscopy.   IMPRESSION:  The patient is a pleasant 55 year old lady with chronic  diarrhea and intermittent hematochezia.  The differential is potentially  broad, although this may be nothing more than anorectal bleeding in the  setting of irritable bowel syndrome, possibly exacerbated by the post  cholecystectomy state.  At any rate, she needs her lower  gastrointestinal  tract evaluated.  To this end, I have recommended  colonoscopy.  _The potential _________  risks, benefits, and alternatives  have been reviewed.  Questions answered.  She is agreeable.  Will make  further recommendations in the very near future, once colonoscopy has  been performed.   I would like to thank Dr. Shaune Pollack for allowing me to see this nice  lady once again.      Jasmin Martin, M.D.  Electronically Signed     RMR/MEDQ  D:  03/14/2006  T:  03/14/2006  Job:  [id number]   cc:   Jasmin Martin L. Jasmin Martin, M.D.  Fax: [phone]

## 2010-08-18 NOTE — H&P (Signed)
NAME:  Jasmin Martin, Jasmin Martin                           ACCOUNT NO.:  [id number]   MEDICAL RECORD NO.:  [id number]                   PATIENT TYPE:  INP   LOCATION:  A201                                 FACILITY:  [facility]   PHYSICIAN:  Sarita Bottom, M.D.                  DATE OF BIRTH:  1955/04/04   DATE OF ADMISSION:  12/17/2001  DATE OF DISCHARGE:                                HISTORY & PHYSICAL   REASON FOR ADMISSION:  Chest pain, to rule out myocardial infarction.   OTHER PROBLEMS:  1. Hypertension.  2. Hyperlipidemia.  3. Hypothyroidism.   CHIEF COMPLAINT:  I have chest pain.   HISTORY OF PRESENT ILLNESS:  The patient is a 55 year old lady with a  history of hypertension, hyperlipidemia, hypothyroidism.  She was apparently  well until this evening, was playing Bingo, when she developed a sudden  onset of chest pressure which was nonradiating, which was sternal in  location, 7/10 on a scale of 1-10.  The pain was relived after being given  nitroglycerin sublingual and Isordil in the ER.  She said the pain lasted  for about two hours.  The pain was associated with nausea, shortness of  breath, and palpitations.  She had a similar episode of chest pain in 1998  for which she was admitted to [facility] where she had a cardiac  catheterization which did not really reveal any obstruction in any of the  coronary arteries.   PAST MEDICAL HISTORY:  Hypertension, hypothyroidism, and hyperlipidemia.   ALLERGIES:  No known drug allergies.   MEDICATIONS:  1. Synthroid, dosage unknown.  2. Antihypertensive medication for which she does not know the name and the     dosage.  3. Zocor for hyperlipidemia but she does not know the dosage.   FAMILY HISTORY:  Significant for coronary artery disease in her brother and  mother also has problems with arrhythmias.   SOCIAL HISTORY:  She does not smoke, she does not drink alcohol.  She is  married with one child.   REVIEW OF SYSTEMS:   CONSTITUTIONAL:  She denies having any malaise; she  denies any fever.  RESPIRATORY:  She denies having any cough.  CARDIOVASCULAR:  She admits to having chest pain but denies any  palpitations.  GASTROINTESTINAL:  She denies any diarrhea or constipation.  NEUROLOGIC:  She denies any dizziness or tremors.  MUSCULOSKELETAL:  She  denies any back pain.  PSYCHIATRIC:  She denies any depression.  SKIN:  She  denies any rash or ulcers.  EXTREMITIES:  She denies any pedal edema.   PHYSICAL EXAMINATION:  VITAL SIGNS:  Blood pressure 140/59, heart rate 57,  respiratory rate 18, and temperature 97.7 degrees Fahrenheit.  GENERAL:  She is an obses lady lying comfortably in bed.  She was not in any  apparent distress.  HEENT:  She was not pale; she was  anicteric.  Pupils were equal and reactive  to light and accommodation.  NECK:  Supple.  There was no jugular venous distention.  There was no  lymphadenopathy.  CHEST:  Clear to auscultation.  CARDIOVASCULAR:  Heart sounds 1 and 2 were normal.  No murmurs were heard.  ABDOMEN:  Obese.  She had a surgical scar.  There were no masses or  organomegaly.  NEUROLOGIC:  She was alert and oriented x3.  There were no focal  neurological deficits.  EXTREMITIES:  She had a surgical scar of the right ankle joint and she did  not have any pedal edema.  SKIN:  There were no rashes or ulcers.   LABORATORY DATA:  An ABG that was done, ? whether it was done on room air:  pH 7.37, PCO2 39.1, PO2 80.1, bicarb 22.3, O2 saturation 95.9%.  Wbc 8.6,  hemoglobin 13.6, hematocrit 39.3, MCV 90.3, platelet count 316, neutrophil  count 57, lymphocytes 33, monocytes 8.  D-dimer was negative.  Sodium 137, potassium 3.5, chloride 106, CO2 32, BUN  15, creatinine 0.6, glucose 94, calcium 8.  Cardiac enzymes:  First set of  CK 50, MB fraction 0.9, troponin less than 0.01.   Her EKG showed a normal sinus rhythm at about 79 beats per minute, normal  electrical axis, normal  intervals.  She had some T wave inversion in lead V5  and V6 and some small Q waves in lead II and III.  There were no acute ST or  T wave changes.   ASSESSMENT:  1. Chest pain in a patient with hypertension, hyperlipidemia, and obesity;     to rule out myocardial infarction.  2. Hypertension.  3. Hyperlipidemia.  4. Hypothyroidism.   PLAN:  Admit to telemetry for monitoring.  Give the patient nitroglycerin  sublingual 0.4 mg p.r.n. for chest pain.  Hold the beta blocker because the  patient's heart rate was 57 on examination.  Give the patient aspirin 325 mg  p.o. q.d., Isordil 20 mg p.o. b.i.d., oxygen 2 liters per minute via nasal  cannula.  Serial cardiac enzymes and EKG.  Continue with telemetry  monitoring.  Call cardiology consult for a possible stress test if MI is  ruled out.  The patient is to be admitted for Dr. Juanetta Gosling.  Also, restart  the patient's Synthroid to start with a dose of 50 mcg per day.  Do a serum  TSH level.  Restart the Zocor at 20 mg p.o. q.d.  Check a serum lipid panel.  Start the patient on Norvasc for hypertension, 5 mg p.o. q.d.                                                Sarita Bottom, M.D.    DW/MEDQ  D:  12/18/2001  T:  12/18/2001  Job:  [id number]   cc:   Fredirick Maudlin, M.D.

## 2010-08-18 NOTE — Op Note (Signed)
NAMEPRISEIS, CRATTY                 ACCOUNT NO.:  [id number]   MEDICAL RECORD NO.:  [id number]          PATIENT TYPE:  AMB   LOCATION:  DAY                           FACILITY:  [facility]   PHYSICIAN:  Vickki Hearing, M.D.DATE OF BIRTH:  November 02, 1955   DATE OF PROCEDURE:  01/23/2005  DATE OF DISCHARGE:                                 OPERATIVE REPORT   INDICATIONS:  Medial knee pain, right knee, failed conservative therapy.   PREOPERATIVE DIAGNOSIS:  Meniscal tears, right knee.   POSTOPERATIVE DIAGNOSIS:  Medial meniscal tear, osteoarthritis, right knee   FINDINGS:  Torn posterior horn medial meniscus, parrot beak type tear, with  grade 3 lesion of the medial femoral condyle.   ANESTHETIC:  General.   PROCEDURE:  Arthroscopy, right knee; partial medial meniscectomy,  microfracture medial femoral condyle.   SURGEON:  Dr. Romeo Apple.   SPECIMENS:  No specimens.   BLOOD LOSS:  Minimal.   COMPLICATIONS:  None.   CONDITION AT THE END OF PROCEDURE:  The patient went to PACU in good  condition.   The patient was identified as Jasmin Martin in the preop holding area. She  marked the right as the surgical site, and this was countersigned by the  surgeon. History and physical updated was completed. The patient was given  antibiotics, taken to the operating room for general anesthetic. In supine  position, her right leg was prepped and draped using sterile technique.   At that time, we took a time out. We used a lateral approach to the knee,  and via lateral portal, we performed diagnostic arthroscopy. Patellofemoral  compartment was reasonably clean as was the lateral compartment. ACL and PCL  were intact.   Medial femoral condyle had a grade 3 lesion. Posterior horn of the medial  meniscus had a radial type parrot beak type tear.   Using a medial working portal, a straight duckling was used to resect the  tear. The meniscus was balanced, and meniscal fragments were removed from  the  knee with a motorized shaver. We then took a chondral pick and performed  microfracture of the medial femoral condyle. We used a shaver to suck out  the debris from the knee. We probed the meniscus to make sure we had a  stable rim. After that, the knee was irrigated and closed with Steri-Strips,  injected 30 cc Marcaine. Sterile dressings were applied along with CryoCuff.  The patient was extubated, taken to recovery room in stable condition.   POSTOPERATIVE PLAN:  Weightbearing with a walker for the first four weeks  and then weight bear as tolerated an additional four weeks. Initial four  weeks of therapy, full weightbearing with the walker only.      Vickki Hearing, M.D.  Electronically Signed     SEH/MEDQ  D:  01/23/2005  T:  01/23/2005  Job:  [id number]

## 2010-08-18 NOTE — Procedures (Signed)
   NAME:  Jasmin Martin, Jasmin Martin                           ACCOUNT NO.:  [id number]   MEDICAL RECORD NO.:  [id number]                   PATIENT TYPE:  INP   LOCATION:  A201                                 FACILITY:  [facility]   PHYSICIAN:  Gerrit Friends. [name], M.D. [facility]        DATE OF BIRTH:  [birth year]   DATE OF PROCEDURE:  12/18/2001  DATE OF DISCHARGE:  12/18/2001                                  ECHOCARDIOGRAM   CLINICAL DATA:  A 55 year old woman with chest pain, palpitations, and  obesity.   1. Technically limited but adequate echocardiographic study.  2. Mild left atrial enlargement; normal right atrial size.  Normal right     ventricular size and function.  3. Normal aortic valve with mild annular calcification.  4. Normal mitral valve with mild annular calcification.  5. Normal tricuspid and pulmonic valves.  6. Normal internal dimension of the left ventricle; borderline LVH.  Normal     regional and global LV systolic function.  7. Comparison with prior study of April 27, 1996:  No significant interval     change.                                                  Gerrit Friends. [name], M.D. [facility]    RMR/MEDQ  D:  12/18/2001  T:  12/22/2001  Job:  [id number]

## 2010-08-18 NOTE — Group Therapy Note (Signed)
   NAME:  Jasmin Martin, Jasmin Martin                           ACCOUNT NO.:  [id number]   MEDICAL RECORD NO.:  [id number]                   PATIENT TYPE:  INP   LOCATION:  A320                                 FACILITY:  [facility]   PHYSICIAN:  Edward L. Juanetta Gosling, M.D.             DATE OF BIRTH:  December 21, 1955   DATE OF PROCEDURE:  08/21/2002  DATE OF DISCHARGE:                                   PROGRESS NOTE   SUBJECTIVE:  The patient says she is feeling better and has no complaints.  She is still having some diarrhea, but she has chronic diarrhea even when  she is doing well.  She says she would like to be discharged home because  she is so much improved.  I think that is reasonable and plan to discharge  her home today; please see Discharge Summary for details.   OBJECTIVE:  Her exam shows her abdomen is fairly soft, chest is clear, heart  is regular.  Her lab work is essence normal with a minimal anemia.                                               Edward L. Juanetta Gosling, M.D.    ELH/MEDQ  D:  08/21/2002  T:  08/21/2002  Job:  [id number]

## 2010-08-18 NOTE — H&P (Signed)
NAME:  Jasmin Martin, Jasmin Martin                           ACCOUNT NO.:  [id number]   MEDICAL RECORD NO.:  [id number]                   PATIENT TYPE:  AMB   LOCATION:  DAY                                  FACILITY:  [facility]   PHYSICIAN:  Lazaro Arms, M.D.                DATE OF BIRTH:  [birth year]   DATE OF ADMISSION:  DATE OF DISCHARGE:                                HISTORY & PHYSICAL   HISTORY OF PRESENT ILLNESS:  Jasmin Martin is a 55 year old white female, gravida 2,  para 1, status post a tubal who presented to our office on May 24, 2003, for yearly exam.  She had been having some bleeding, on-again, off-  again bleeding, nothing heavy.  She had been on unopposed Gynodiol.  We gave  her 10 days of Provera to see her response, and she had extremely heavy  bleeding.  After she was given the Provera, she still had an endometrial  stripe width of 1.4 cm.  It was globular, not really consistent with a  polyp, but that is certainly possible.  I tried to do an endometrial biopsy  in the office but was unable to because of cervical stenosis, and she was  not able to tolerate any other dilatation as a result.  She is admitted for  hysteroscopy, D&C and endometrial ablation.   PAST MEDICAL HISTORY:  1. Morbid obesity.  2. Hypercholesterolemia.  3. History of hypertension.  4. History of irritable bowel syndrome.   PAST SURGICAL HISTORY:  1. Cesarean section.  2. Tubal ligation.   PAST OB HISTORY:  Cesarean section as above.   ALLERGIES:  None.   MEDICATIONS:  1. Synthroid.  2. Toprol.  3. Metered dose inhaler at home.   REVIEW OF SYSTEMS:  As per HPI, otherwise negative.   PHYSICAL EXAMINATION:  VITAL SIGNS: Weight 354 pounds, blood pressure  160/100.  HEENT: Unremarkable.  NECK:  Thyroid is normal.  LUNGS:  Clear.  HEART:  Regular rate and rhythm, no murmurs, rubs or gallops.  BREAST:  Deferred.  ABDOMEN:  Morbidly obese but benign.  PELVIC:  Exam is negative.  EXTREMITIES:  Warm, no  edema.  NEUROLOGIC:  Exam is grossly intact.   IMPRESSION:  1. Postmenopausal bleeding.  2. Morbid obesity.   PLAN:  The patient is admitted for hysteroscopy D&C and endometrial ablation  for evaluation of her bleeding due to unopposed estrogen.  Of course, with  her morbid obesity, she is also at risk for endometrial carcinoma as well.  She has been taken off of her Gynodiol in the meantime.  The patient  understands the indications for the surgery, and will proceed.     ___________________________________________  Lazaro Arms, M.D.   Loraine Maple  D:  07/07/2003  T:  07/08/2003  Job:  [id number]

## 2010-08-18 NOTE — Consult Note (Signed)
NAME:  Jasmin Martin, Jasmin Martin                           ACCOUNT NO.:  [id number]   MEDICAL RECORD NO.:  [id number]                   PATIENT TYPE:  INP   LOCATION:  A201                                 FACILITY:  [facility]   PHYSICIAN:  Gerrit Friends. [name], M.D. [facility]        DATE OF BIRTH:  [birth year]   DATE OF CONSULTATION:  12/18/2001  DATE OF DISCHARGE:                                   CONSULTATION   PRIMARY CARDIOLOGIST:  Jesse Sans. Wall, M.D.   HISTORY OF PRESENT ILLNESS:  A 55 year old woman admitted to [facility] with palpitations and chest pain.  The patient was first evaluated  by Dr. Daleen Squibb approximately three years ago for a symptomatic episode that was  virtually identical to this one.  At that time she had reported palpitations  for a number of year but no significant chest discomfort.  She underwent  cardiac catheterization that demonstrated normal left ventricular function  and normal coronary anatomy.  She subsequently returned with recurrent  palpitations but no chest discomfort.  Event recording was planned but was  cancelled when the patient was found to be hypothyroid.  Thyroid replacement  therapy was started and she once again did well until this admission.  Again, she notes intermittent palpitations, recently occurring a few times  per week.  Of note, she had continuing symptoms including palpitations at  the time she was seen in the emergency department but heart rate was  actually on the slow side.   MEDICATIONS:  Medications when last seen in the office in September 2002  included verapamil, estradiol, and Prometrium.  She apparently has been  started on a statin drug since that time.   PAST MEDICAL HISTORY:  Notable for hypertension, hypothyroidism, obesity,  and hyperlipidemia.   SOCIAL HISTORY:  Lives in [place]; no use of tobacco products nor alcohol.   FAMILY HISTORY:  Brother has suffered a myocardial infarction at a young  age.   REVIEW OF SYSTEMS:  The  patient reports some chronic anxiety.  She notes  episodes of diaphoresis unrelated to her cardiac symptoms.  She has had  exertional dyspnea which is chronic.  She has GERD symptoms as well as heat  and cold intolerance.  All other systems negative.   PHYSICAL EXAMINATION:  GENERAL:  An overweight, pleasant, youthful-appearing  woman.  VITAL SIGNS:  Heart rate 65, blood pressure 110/60, temperature 97.3,  respirations 18.  SKIN:  No significant lesions.  HEENT:  Anicteric sclerae.  ENDOCRINE:  No thyromegaly.  HEMATOPOETIC:  No adenopathy.  NECK:  No jugular venous distention; low-pitched early systolic bruits.  LUNGS:  Clear.  CARDIAC:  Fourth heart sound and modest basilar systolic ejection murmur.  ABDOMEN:  Soft and nontender; no organomegaly.  EXTREMITIES:  Normal distal pulses; trace edema.  NEUROMUSCULAR:  Symmetric strength and tone.   LABORATORY DATA:  EKG:  Sinus bradycardia; minor nonspecific ST-T  abnormality; nondiagnostic inferior  Q waves; delayed R wave progression.   Chest x-ray:  Reportedly shows infiltrate in the left lung - report not  available.   Other laboratory notable for negative troponins, negative CPK.  Normal ABG.  Negative D-dimer.   IMPRESSION:  The patient presents with symptoms that are once again  impressive in the absence of objective evidence for significant cardiac  disease.  If troponins at 12-16 hours are negative, as expected, she can be  discharged.  We will plan to perform stress testing and event recording on  an outpatient basis and have her follow up with Dr. Daleen Squibb.  If no specific  etiology is determined for her symptoms, treatment for anxiety would be  reasonable.                                                 Gerrit Friends. [name], M.D. [facility]    RMR/MEDQ  D:  12/18/2001  T:  12/19/2001  Job:  [id number]

## 2010-08-18 NOTE — Discharge Summary (Signed)
   NAME:  Jasmin Martin, Jasmin Martin                           ACCOUNT NO.:  [id number]   MEDICAL RECORD NO.:  [id number]                   PATIENT TYPE:  INP   LOCATION:  A201                                 FACILITY:  [facility]   PHYSICIAN:  Fredirick Maudlin, M.D.              DATE OF BIRTH:  [birth year]   DATE OF ADMISSION:  12/17/2001  DATE OF DISCHARGE:  12/18/2001                                 DISCHARGE SUMMARY   FINAL DISCHARGE DIAGNOSES:  1. Chest pain, myocardial infarction ruled out.  2. Hypertension.  3. Hyperlipidemia.  4. Obesity.   HISTORY:  The patient is a 55 year old who was in her usual state of fairly  good health at home and actually was involved playing bingo when she got  chest discomfort that she describes as being very severe.  She came to the  emergency room, where she was evaluated and underwent EKG and lab work.  After these were done, she had required a total of three nitroglycerin for  relief of her pain but her pain finally did resolve.   PHYSICAL EXAMINATION:  GENERAL:  She was moderately obese.  CHEST:  Rhonchi and rales.  ABDOMEN:  Soft.   LABORATORY DATA:  Electrocardiogram showed no acute changes.   Her D-dimer was normal.  Her cardiac enzymes did not show any evidence of  infarction.   HOSPITAL COURSE:  Consultation was obtained with [facility] Cardiology team and  she had seen them previously and had been noted to have a normal cardiac  catheterization about three years ago.  Because of the negative cardiac  enzymes and the history of a normal cardiac catheterization in the past, it  was felt that she could be safely discharged to follow up with a stress  Cardiolite test as an outpatient.   DISCHARGE MEDICATIONS:  She is on new medications at this time.   FOLLOW UP:  We will have her follow up as scheduled for the outpatient  stress Cardiolite.                                               Fredirick Maudlin, M.D.    ELH/MEDQ  D:  12/18/2001  T:   12/22/2001  Job:  [id number]

## 2010-08-18 NOTE — Op Note (Signed)
NAME:  Jasmin Martin, Jasmin Martin                           ACCOUNT NO.:  [id number]   MEDICAL RECORD NO.:  [id number]                   PATIENT TYPE:  INP   LOCATION:  A418                                 FACILITY:  [facility]   PHYSICIAN:  Lazaro Arms, M.D.                DATE OF BIRTH:  1955/05/29   DATE OF PROCEDURE:  09/07/2003  DATE OF DISCHARGE:                                 OPERATIVE REPORT   PREOPERATIVE DIAGNOSES:  1. 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  2. Dysmenorrhea.  3. History of cervical myoma.   POSTOPERATIVE DIAGNOSES:  1. 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  2. Dysmenorrhea.  3. History of cervical myoma.   PROCEDURE:  TAHBSO.   SURGEON:  Lazaro Arms, M.D.   ANESTHESIA:  General endotracheal.   FINDINGS:  The patient had a slightly enlarged uterus and small myomas.  The  cervix appeared basically normal.  The myoma that I resected a few months  ago, actually the bed looked like it had completely healed up, so there were  no abnormalities of the cervix detected.   DESCRIPTION OF PROCEDURE:  The patient was taken to the operating room and  placed in the supine position where she underwent general endotracheal  anesthesia.  She was prepped and draped in the usual sterile fashion.   A Pfannenstiel skin incision was made and carried down sharply to the rectus  fascia.  The abdominal cavity was entered in the usual fashion.  A  Bookwalter self-retaining retractor was placed.  She had a very deep pelvis.  The bariatric instruments had to be used to their fullest extent.  Some  adhesions were taken down off of the uterus and off of the left and right  adnexa from her previous cesarean section.  The right infundibulopelvic  ligament was double suture ligated and cut.  The uterine vessels were  skeletonized.  The left infundibulopelvic ligament was isolated, dissected,  clamped, cut, and double suture ligated.  The uterine vessels were  skeletonized on that side as well.  The vesicouterine serosal flap was  created and pushed down off of the lower uterine segment.  The uterine  vessels were clamped and cut bilaterally and suture ligated.  Serial  pedicles were taken down the cervix to the cardinal ligament, each pedicle  being clamped, cut, and suture ligated.  This was very difficult because of  the patient's deep pelvis.  I had to take very small pedicles with each  clamp.  We then encountered the vagina.  We crossclamped the vagina.  The  specimen was removed.  Vaginal angle sutures were placed and held, and the  vagina was closed with interrupted figure-of-eight sutures.  Again, with the  pelvis being so deep,  this was quite difficult.  The pelvis was irrigated  vigorously.  I waited a couple of minutes for pressure to make sure that  everything was dry, and it was.  Interceed was placed throughout the vaginal  cuff.  All sutures were cut.  All pedicles were found to be hemostatic.  The  Bookwalter self-retaining retractor was removed as were the packings, laps,  and towels.  The muscles were reapproximated loosely.  The fascia was closed  using running PDS, double looped.  The subcutaneous tissue was made  hemostatic and irrigated.  A JP drain was placed in the subcutaneous tissue.  Interrupted subcutaneous sutures were placed, and the skin was closed using  skin staples.   The patient tolerated the procedure well.  She experienced 600 cc of blood  loss.  She was taken to the recovery room in good and stable condition.  All  counts were correct.      ___________________________________________                                            Lazaro Arms, M.D.   LHE/MEDQ  D:  09/07/2003  T:  09/08/2003  Job:  [id number]

## 2010-08-18 NOTE — Discharge Summary (Signed)
NAME:  Jasmin Martin, Jasmin Martin                           ACCOUNT NO.:  [id number]   MEDICAL RECORD NO.:  [id number]                   PATIENT TYPE:  INP   LOCATION:  A418                                 FACILITY:  [facility]   PHYSICIAN:  Lazaro Arms, M.D.                DATE OF BIRTH:  1955/11/21   DATE OF ADMISSION:  09/07/2003  DATE OF DISCHARGE:  09/10/2003                                 DISCHARGE SUMMARY   DISCHARGE DIAGNOSES:  1. Status post a total abdominal hysterectomy with removal of both tubes and     ovaries.  2. Morbid obesity.  3. Hypertension.  4. Hypothyroidism.  5. Asthma.   PROCEDURES:  Total abdominal hysterectomy with bilateral salpingo-  oophorectomy.   Please refer to the transcribed history and physical and the operative  report for details of admission to the hospital.   HOSPITAL COURSE:  The patient did well postoperatively.  She tolerated clear  liquids and a regular diet.  She had an initial elevated temperature on  postoperative day #1 to 102 degrees.  The chest x-ray revealed atelectasis,  but clear.  She was begun on Levaquin and her breathing treatments were  continued.  She defervesced thereafter.  Her hemoglobin was 11.6 and  hematocrit was 32.8 on postoperative day #1 and her white count was 10,200.  By postoperative day #3, her white count had dropped to 7000 and she had  been afebrile for greater than 24 hours.  She had a JP drain in place in her  incision and it was putting out about 40 mL a day of bloody serosanguineous  fluid.  Her abdominal exam was benign.  Her incision was clean, dry, and  intact.  She tolerated clear liquids, a regular diet, voided without  symptoms, and was extensively ambulatory.  She had progression with normal  bowel function.   DISPOSITION:  She was discharged to home on the morning of postoperative day  #3 in good stable condition.   DISCHARGE MEDICATIONS:  She was given Tylox and Motrin for pain and Levaquin  as an  antibiotic for 10 days.   FOLLOWUP:  She will be seen back in the office on October 14, 2003, to have her  incision examined and JP removed if appropriate.     ___________________________________________                                         Lazaro Arms, M.D.   LHE/MEDQ  D:  10/19/2003  T:  10/19/2003  Job:  [id number]

## 2010-08-18 NOTE — H&P (Signed)
NAMETELINA, KLECKLEY                 ACCOUNT NO.:  [id number]   MEDICAL RECORD NO.:  [id number]          PATIENT TYPE:  AMB   LOCATION:  DAY                           FACILITY:  [facility]   PHYSICIAN:  Vickki Hearing, M.D.DATE OF BIRTH:  08/31/1955   DATE OF ADMISSION:  DATE OF DISCHARGE:  LH                                HISTORY & PHYSICAL   CHIEF COMPLAINT:  Right knee pain.   HISTORY OF PRESENT ILLNESS:  This is a 55 year old female who I have  followed for the last month with right knee pain.  We tried to treat this  nonoperatively and failed.  We did get an MRI which was done at [facility] and showed a radial tear of the medial portion of the medial  meniscus, possibly the posterior horn and body as well, small radial tear,  lateral meniscus, and arthritis, medial and patellofemoral compartments  along with a Baker's cyst.  Due to failure of conservative treatment with  continued pain she presents for knee arthroscopy.  She accepts the risks and  benefits of this procedure.   REVIEW OF SYSTEMS:  Weight gain, shortness of breath, dyspnea, COPD,  pneumonia, diarrhea, unsteady gait, joint pain, thyroid disease, depression,  anxiety, panic attacks.  All other systems reviewed were normal.   PAST SURGICAL HISTORY:  1.  Status post cholecystectomy, recent intra-abdominal tumor removed by Dr.      __________.  2.  Previous history of D&C.   PAST MEDICAL HISTORY:  1.  Thyroid disease.  2.  Arthritis.  3.  Heart disease.  4.  COPD.   ALLERGIES:  None.   MEDICATIONS:  See recorded nurses' notes.   FAMILY HISTORY:  Heart disease, asthma, arthritis.   PRIMARY CARE PHYSICIAN:  Edward L. Juanetta Gosling, M.D.   SOCIAL HISTORY:  Married.  Grades completed:  1-10.   HABITS:  None.   PHYSICAL EXAMINATION:  VITAL SIGNS:  Weight 350, pulse 80, respiratory rate  20.  GENERAL APPEARANCE:  Normal development, grooming, hygiene.  She has  obesity.  She has an endomorphic body habitus.   Nutritional status based on  weight:  Moderate to satisfactory.  EXTREMITIES:  Limb swelling.  Varicosities:  None.  No edema, no tenderness.  No temperature changes.  Pulses are normal.  NODES:  Lymph nodes are benign.  CHEST:  Clear.  HEART:  Rate and rhythm are normal.  SKIN:  Normal.  NEUROLOGIC:  Exam was normal in terms of sensation, reflexes, and  coordination.  She was oriented x3.  Mood and affect were normal.   Regarding her knee, her knee range of motion is limited by the morphology of  her leg.  She does have flexion past 90 degrees.  She has pain on range of  motion, some tenderness and swelling, primarily medial compartment.  Her  knee appears to be stable.  She has a difficult exam because of the size of  the leg.  She has no normal muscle atrophy, normal muscle tone.  Extensor  mechanism is intact.  Lateral ligaments appear stable.   Radiographs  show osteoarthritis in the medial compartment.  MRI findings are  as noted.   DIAGNOSES:  1.  Medial meniscal tear, possible lateral meniscal tear.  2.  Osteoarthritis, right knee.   PLAN:  Arthroscopy, right knee.  Address meniscal findings  as encountered.      Vickki Hearing, M.D.  Electronically Signed     SEH/MEDQ  D:  01/22/2005  T:  01/22/2005  Job:  [id number]

## 2010-08-18 NOTE — Op Note (Signed)
NAMEADYLYNN, HERTENSTEIN                 ACCOUNT NO.:  [id number]   MEDICAL RECORD NO.:  [id number]          PATIENT TYPE:  AMB   LOCATION:  DAY                           FACILITY:  [facility]   PHYSICIAN:  R. Roetta Sessions, M.D. DATE OF BIRTH:  1956-04-02   DATE OF PROCEDURE:  03/21/2006  DATE OF DISCHARGE:                               OPERATIVE REPORT   Diagnostic colonoscopy, ileoscopy, biopsy and stool sampling.   INDICATIONS FOR PROCEDURE:  The patient is a 50-year lady with chronic  diarrhea and intermittent hematochezia.  Colonoscopy is now being done.  This approach has discussed with the patient at length.  Potential  risks, benefits and alternatives have been reviewed, questions answered.  She is agreeable.  Please see documentation in the medical record.   PROCEDURE NOTE:  O2 saturation, blood pressure, pulse and respirations  were monitored throughout the entire procedure.  Conscious sedation  Versed 7 mg IV, Demerol 175 mg IV in divided doses.   INSTRUMENT:  Pentax video chip system.   FINDINGS:  Digital rectal exam revealed no abnormalities.   ENDOSCOPIC FINDINGS:  The prep was good.  Examination of the colonic  mucosa from the rectosigmoid junction through the left, transverse, and  right colon to the area of the appendiceal orifice and ileocecal valve  was undertaken.  These structures were well seen and photographed for  the record.  Terminal ileum was intubated to 5 cm.  From this level, the  scope slowly and cautiously withdrawn.  All previously mentioned mucosal  surfaces were again seen.  The patient had a few scattered left-sided  diverticula.  The colonic mucosa otherwise appeared normal.  Scope was  pulled down into the rectum where a thorough examination of the rectal  mucosa en face and retroflexion was performed.  The patient had some  friable anal canal hemorrhoids.  Otherwise, rectal mucosa appeared  entirely normal.  It is notable biopsies of the sigmoid were  taken to  rule out microscopic colitis.  Also stool samples obtained.  The patient  tolerated the procedure well and was reactive to endoscopy.   IMPRESSION:  1. Friable anal canal hemorrhoids, otherwise normal rectum.  2. A few scattered left-sided diverticula.  The remainder of the      colonic and terminal ileal mucosa appeared normal.  Biopsies of the      sigmoid colon taken for histologic study.  Stool sample obtained.   RECOMMENDATIONS:  1. Hemorrhoid literature provided to [name].  A 10-day course of      Anusol HC suppositories 1 per rectum      bedtime.  2. Bentyl 10 mg p.o. a.c. t.i.d. p.r.n. diarrhea.  3. Further recommendations to follow.      [name], M.D.  Electronically Signed     RMR/MEDQ  D:  03/21/2006  T:  03/21/2006  Job:  [id number]   cc:   Ramon Dredge L. Juanetta Gosling, M.D.  Fax: [phone]

## 2010-08-18 NOTE — Op Note (Signed)
NAME:  Jasmin Martin, Jasmin Martin                           ACCOUNT NO.:  [id number]   MEDICAL RECORD NO.:  [id number]                   PATIENT TYPE:  AMB   LOCATION:  DAY                                  FACILITY:  [facility]   PHYSICIAN:  Lazaro Arms, M.D.                DATE OF BIRTH:  06/14/1955   DATE OF PROCEDURE:  07/08/2003  DATE OF DISCHARGE:                                 OPERATIVE REPORT   PREOPERATIVE DIAGNOSES:  1. Postmenopausal bleeding.  2. Unopposed estrogen x2 years.  3. Thickened endometrial stripe.  4. Inability to perform an office biopsy.   POSTOPERATIVE DIAGNOSES:  1. Postmenopausal bleeding.  2. Unopposed estrogen x2 years.  3. Thickened endometrial stripe.  4. Inability to perform an office biopsy.  5. Large cervical myoma.   PROCEDURE:  1. Myomectomy of cervix.  2. Hysteroscopy dilatation and curettage.   SURGEON:  Lazaro Arms, M.D.   ANESTHESIA:  General endotracheal.   FINDINGS:  The patient was seen originally, about a month or so ago, for her  yearly exam.  She was found to have benign unopposed estrogen for the last 2  years with no bleeding.  She was given Provera for 10 days and had 2 weeks  of very heavy, crampy bleeding.  After that endometrial stripe evaluation  with a vaginal ultrasound and it was still 14 mm.  As a result of that she  needed to have endometrial sampling.  I tried to do it in the office, and  was unsuccessful because I could not find her cervix.  As a result she was  brought to the operating room where I planned to do a hysteroscopy  dilatation and curettage endometrial ablation.  At the time of surgery I am  still unable to find her cervix.   As I placed the tenaculum on the posterior lip of the cervix part of the  cervix almost chipped off.  I sent this down for frozen even though she had  a recent normal Pap smear.  I was concerned that this may represent a  cervical malignancy.  I talked with Dr. Leretha Dykes and he stated that  it  appeared to be a benign smooth-muscle tumor or leiomyoma.  As a result I  proceeded and did a removal of the leiomyoma and then did the hysteroscopy  dilatation and curettage.  She had a thickened endometrium and maybe some  little small polyps, but it was basically normal.   DESCRIPTION OF OPERATION:  The patient was taken to the operating room and  placed in the supine position where she underwent general endotracheal  anesthesia.  She was placed in the dorsal lithotomy position and prepped and  draped in the usual sterile fashion.  A paracervical block was placed.  Her  cervix was grasped and, again, I could not find a cervical opening.  I  prodded blindly in the  general area thickening that she may have a thin  membrane over it, but was unsuccessful.  Knowing that she had had heavy  bleeding a few weeks ago I was convinced that she should have an opening  somewhere.  I grasped the posterior lip of the cervix and a portion of  cervix just sort of tore off.  I sent this to pathology to do a frozen  concerned that it may be a necrotic malignancy, even though she had had  recent normal Papanicolaou smear on sure prep.  Dr. Leretha Dykes called me and  told me that he thought it was a leiomyoma.  As a result, I used a #10 blade  and did a myomectomy of a cervical myoma, sharply.  I then was able to find  the endocervical canal and dilated it serially to allow passage of the  hysteroscope.  Normal saline was used as the distending media.  The  endometrium was found to be sort of fluffy and thickened, but no significant  pathology seen.  A vigorous curettage was performed.   Because I basically had the cervix filleted open I did not do an endometrial  ablation and will treat her with progestin agents.  There was minimal blood  loss, probably about a 100 total for the procedure.  Monsel's was used for  basically the myomectomy bed to stop bleeding.  There was no active  bleeding.  No sutures  were placed.  What was left of the cervix, basically  retracted back up deep into the vagina and it was basically flush with the  vagina, at this point.  The peritoneum was not entered.   The patient tolerated the procedure well.  She was awakened from anesthesia  and taken to the recovery room in good stable condition.  All specimens went  to the lab.  Blood loss was 100 cc.  She received Ancef and Toradol  prophylactically.      ___________________________________________                                            Lazaro Arms, M.D.   Loraine Maple  D:  07/08/2003  T:  07/08/2003  Job:  [id number]

## 2010-08-18 NOTE — Discharge Summary (Signed)
   NAME:  Jasmin Martin, Jasmin Martin                           ACCOUNT NO.:  [id number]   MEDICAL RECORD NO.:  [id number]                   PATIENT TYPE:  INP   LOCATION:  A320                                 FACILITY:  [facility]   PHYSICIAN:  Edward L. Juanetta Gosling, M.D.             DATE OF BIRTH:  [birth year]   DATE OF ADMISSION:  08/18/2002  DATE OF DISCHARGE:  08/21/2002                                 DISCHARGE SUMMARY   FINAL DISCHARGE DIAGNOSES:  1. Gastroenteritis.  2. Dehydration.  3. Supraventricular tachycardia.  4. Hypothyroidism.  5. Hypertension.  6. Hyperlipidemia.  7. Chronic diarrhea.   HISTORY OF PRESENT ILLNESS:  This is a 55 year old who came in with nausea  and vomiting.  She had sudden onset of symptoms.  She did not have any  abdominal pain but just vomited on multiple occasions; now has essentially  dry heaves.  She has chronic diarrhea so it is difficult to tell if  diarrhea is part of her illness or not.   PHYSICAL EXAMINATION ON ADMISSION:  VITAL SIGNS:  Temperature 97.9, pulse  77, respirations 20, blood pressure 123/56.  GENERAL:  She appeared to be slightly dehydrated.  HEART:  Regular without gallop.  ABDOMEN:  Soft but bowel sounds were slightly hyperactive.  CHEST:  Clear.  EXTREMITIES:  Showed no edema.   LABORATORY DATA:  White count 14,600; hemoglobin 14.8; platelets 308.  Amylase 29, lipase 42, sodium 130, potassium 4.8, CO2 27, BUN 11, creatinine  0.9, and glucose 145.   HOSPITAL COURSE:  She was felt to have a viral gastroenteritis, was started  on IV fluids, given ice chips, and improved over about 48 hours to the point  that she just had diarrhea which is a chronic finding for her.  She was  discharged home in improved condition.   MEDICATIONS:  1. Phenergan 25 mg q.6h. p.r.n.  2. Lomotil one p.o. q.6h. p.r.n. diarrhea.  3. Calan SR 240 mg daily.  4. Mavik 4 mg daily.  5. Synthroid 0.088 mg daily.  6.     Tambocor 50 mg q.12h.  7. Tylenol as needed  for pain.   FOLLOW-UP:  She will follow up in my office in about a month.                                               Edward L. Juanetta Gosling, M.D.    ELH/MEDQ  D:  08/21/2002  T:  08/21/2002  Job:  [id number]

## 2010-08-24 ENCOUNTER — Ambulatory Visit (INDEPENDENT_AMBULATORY_CARE_PROVIDER_SITE_OTHER): Payer: Medicaid Other | Admitting: *Deleted

## 2010-08-24 DIAGNOSIS — I4891 Unspecified atrial fibrillation: Secondary | ICD-10-CM

## 2010-08-24 DIAGNOSIS — Z7901 Long term (current) use of anticoagulants: Secondary | ICD-10-CM

## 2010-09-21 ENCOUNTER — Ambulatory Visit (INDEPENDENT_AMBULATORY_CARE_PROVIDER_SITE_OTHER): Payer: Medicaid Other | Admitting: *Deleted

## 2010-09-21 DIAGNOSIS — I4891 Unspecified atrial fibrillation: Secondary | ICD-10-CM

## 2010-09-21 DIAGNOSIS — Z7901 Long term (current) use of anticoagulants: Secondary | ICD-10-CM

## 2010-10-03 ENCOUNTER — Other Ambulatory Visit: Payer: Self-pay | Admitting: Cardiology

## 2010-10-19 ENCOUNTER — Ambulatory Visit (INDEPENDENT_AMBULATORY_CARE_PROVIDER_SITE_OTHER): Payer: Medicaid Other | Admitting: *Deleted

## 2010-10-19 DIAGNOSIS — I4891 Unspecified atrial fibrillation: Secondary | ICD-10-CM

## 2010-11-06 ENCOUNTER — Other Ambulatory Visit: Payer: Self-pay | Admitting: Cardiology

## 2010-11-07 ENCOUNTER — Telehealth: Payer: Self-pay | Admitting: *Deleted

## 2010-11-07 ENCOUNTER — Other Ambulatory Visit: Payer: Self-pay | Admitting: Pharmacist

## 2010-11-07 ENCOUNTER — Other Ambulatory Visit: Payer: Self-pay | Admitting: *Deleted

## 2010-11-07 MED ORDER — WARFARIN SODIUM 5 MG PO TABS: 5.0000 mg | ORAL_TABLET | ORAL | Status: DC

## 2010-11-09 ENCOUNTER — Ambulatory Visit (INDEPENDENT_AMBULATORY_CARE_PROVIDER_SITE_OTHER): Payer: Medicaid Other | Admitting: Cardiology

## 2010-11-09 ENCOUNTER — Encounter: Payer: Self-pay | Admitting: Cardiology

## 2010-11-09 VITALS — BP 165/109 | HR 67 | Ht 63.0 in | Wt 340.0 lb

## 2010-11-09 DIAGNOSIS — I10 Essential (primary) hypertension: Secondary | ICD-10-CM

## 2010-11-09 DIAGNOSIS — R002 Palpitations: Secondary | ICD-10-CM

## 2010-11-09 DIAGNOSIS — Z7901 Long term (current) use of anticoagulants: Secondary | ICD-10-CM

## 2010-11-09 DIAGNOSIS — I4891 Unspecified atrial fibrillation: Secondary | ICD-10-CM

## 2010-11-09 DIAGNOSIS — R0602 Shortness of breath: Secondary | ICD-10-CM

## 2010-11-09 NOTE — Progress Notes (Signed)
HPI [name] comes in today for evaluation and management of her chronic A. Fib, anticoagulation, hypertension and morbid obesity.  She has no symptoms of her A. Fib. She seems to be compliant with her medications. She's been up all night with her son who was ill. Her weight continues to increase.  EKG shows A. Fib with a right superior axis, low voltage, and incomplete right bundle branch block,rate is well controlled Past Medical History  Diagnosis Date  . Persistent atrial fibrillation   . Obesity, morbid   . HTN (hypertension)   . DJD (degenerative joint disease)   . Rheumatic fever     denies  h/o  . Arthritis   . Diabetes mellitus type II     denies h/o And CVA   . Insomnia   . Depression   . Bilateral knee pain     Past Surgical History  Procedure Date  . Bilateral knee surgery   . Vesicovaginal fistula closure w/ tah   . Tonsillectomy   . Right ankle surgery   . Cardiac catheterization 1990's     normal per patient   . Cholecystectomy 2001  . Cesarean section     Family History  Problem Relation Age of Onset  . Cancer Father     Lung     History   Social History  . Marital Status: Legally Separated    Spouse Name: N/A    Number of Children: N/A  . Years of Education: N/A   Occupational History  . Not on file.   Social History Main Topics  . Smoking status: Never Smoker   . Smokeless tobacco: Never Used  . Alcohol Use: No  . Drug Use: No  . Sexually Active: Not on file   Other Topics Concern  . Not on file   Social History Narrative  . No narrative on file    Not on File  Current Outpatient Prescriptions  Medication Sig Dispense Refill  . ALPRAZolam (XANAX) 1 MG tablet Take 1 mg by mouth as needed.        Marland Kitchen aspirin (ASPIRIN LOW DOSE) 81 MG EC tablet Take 81 mg by mouth daily.        Marland Kitchen DILTIAZEM HCL ER BEADS PO Take 240 mg by mouth.        . furosemide (LASIX) 40 MG tablet TAKE ONE TABLET DAILY.  30 tablet  0  . lisinopril  (PRINIVIL,ZESTRIL) 20 MG tablet Take 20 mg by mouth daily.        . metoprolol (TOPROL-XL) 100 MG 24 hr tablet Take 100 mg by mouth daily. Take 1 tab am 1/2 tab pm per Dr.Hawkins       . oxyCODONE-acetaminophen (PERCOCET) 5-325 MG per tablet Take 1 tablet by mouth 4 (four) times daily as needed. Take 1 tablet by mouth four times a day as needed       . warfarin (COUMADIN) 5 MG tablet Take 1 tablet (5 mg total) by mouth as directed. 7.5mg  once daily except 5mg  on Mondays, Wednesdays and Fridays,  40 tablet  3    ROS Negative other than HPI.   PE General Appearance: well developed, well nourished in no acute distress, morbidly obese HEENT: symmetrical face, PERRLA, good dentition  Neck: no JVD, thyromegaly, or adenopathy, trachea midline Chest: symmetric without deformity Cardiac: PMI poorly appreciated, irregular rate and rhythm, normal S1, S2, no gallop or murmur Lung: clear to ausculation and percussion Vascular: all pulses full without bruits  Abdominal:  nondistended, nontender, good bowel sounds, no HSM, no bruits Extremities: no cyanosis, clubbing, 1+ edema bilaterally, no sign of DVT, no varicosities  Skin: normal color, no rashes Neuro: alert and oriented x 3, non-focal Pysch: normal affect Filed Vitals:   11/09/10 1021  BP: 165/109  Pulse: 67  Height: 5\' 3"  (1.6 m)  Weight: 340 lb (154.223 kg)  SpO2: 95%    EKG  Labs and Studies Reviewed.   Lab Results  Component Value Date   WBC 6.9 10/22/2009   HGB 13.2 10/22/2009   HCT 38.5 10/22/2009   MCV 97.5 10/22/2009   PLT 225 10/22/2009      Chemistry      Component Value Date/Time   NA 140 10/19/2009 1315   K 4.0 10/19/2009 1315   CL 107 10/19/2009 1315   CO2 26 10/19/2009 1315   BUN 11 10/19/2009 1315   CREATININE 0.62 10/19/2009 1315      Component Value Date/Time   CALCIUM 8.4 10/19/2009 1315   ALKPHOS 83 07/20/2009 0528   AST 23 07/20/2009 0528   ALT 37* 07/20/2009 0528   BILITOT 0.7 07/20/2009 0528       No  results found for this basename: CHOL   No results found for this basename: HDL   No results found for this basename: LDLCALC   No results found for this basename: TRIG   No results found for this basename: CHOLHDL   Lab Results  Component Value Date   HGBA1C  Value: 5.8 (NOTE) The ADA recommends the following therapeutic goal for glycemic control related to Hgb A1c measurement: Goal of therapy: <6.5 Hgb A1c  Reference: American Diabetes Association: Clinical Practice Recommendations 2010, Diabetes Care, 2010, 33: (Suppl  1). 05/27/2009   Lab Results  Component Value Date   ALT 37* 07/20/2009   AST 23 07/20/2009   ALKPHOS 83 07/20/2009   BILITOT 0.7 07/20/2009

## 2010-11-09 NOTE — Patient Instructions (Signed)
**Note De-identified Jasmin Martin Obfuscation** Your physician encouraged you to lose weight for better health. Please see handout given to you at today's visit.  Your physician recommends that you schedule a follow-up appointment in: 1 year  

## 2010-11-09 NOTE — Assessment & Plan Note (Signed)
Deteriorated. Encouraged again to lose weight. I made her aware ofher risk of diabetes and other obesity problems.

## 2010-11-09 NOTE — Assessment & Plan Note (Signed)
Elevated this morning but has been up all night. Compliance encouraged. No changes made.

## 2010-11-09 NOTE — Assessment & Plan Note (Signed)
Stable no change in treatment. Follow up in a year. Monitor anticoagulation.

## 2010-11-16 ENCOUNTER — Ambulatory Visit (INDEPENDENT_AMBULATORY_CARE_PROVIDER_SITE_OTHER): Payer: Medicaid Other | Admitting: *Deleted

## 2010-11-16 DIAGNOSIS — Z7901 Long term (current) use of anticoagulants: Secondary | ICD-10-CM

## 2010-11-16 DIAGNOSIS — I4891 Unspecified atrial fibrillation: Secondary | ICD-10-CM

## 2010-12-07 ENCOUNTER — Ambulatory Visit (INDEPENDENT_AMBULATORY_CARE_PROVIDER_SITE_OTHER): Payer: Medicaid Other | Admitting: *Deleted

## 2010-12-07 ENCOUNTER — Other Ambulatory Visit: Payer: Self-pay | Admitting: Cardiology

## 2010-12-07 DIAGNOSIS — I4891 Unspecified atrial fibrillation: Secondary | ICD-10-CM

## 2010-12-07 DIAGNOSIS — Z7901 Long term (current) use of anticoagulants: Secondary | ICD-10-CM

## 2010-12-07 LAB — POCT INR: INR: 2.5

## 2010-12-28 LAB — CBC
Hemoglobin: 13.9
MCV: 91.9
RBC: 4.23
WBC: 8.1

## 2010-12-28 LAB — DIFFERENTIAL
Eosinophils Absolute: 0.3
Lymphs Abs: 3.5
Monocytes Absolute: 0.6
Monocytes Relative: 8
Neutrophils Relative %: 45

## 2010-12-28 LAB — BASIC METABOLIC PANEL
CO2: 30
Chloride: 105
Creatinine, Ser: 0.74
GFR calc Af Amer: >60
Potassium: 4.3
Sodium: 140

## 2011-01-04 ENCOUNTER — Ambulatory Visit (INDEPENDENT_AMBULATORY_CARE_PROVIDER_SITE_OTHER): Payer: Medicaid Other | Admitting: *Deleted

## 2011-01-04 DIAGNOSIS — Z7901 Long term (current) use of anticoagulants: Secondary | ICD-10-CM

## 2011-01-04 DIAGNOSIS — I4891 Unspecified atrial fibrillation: Secondary | ICD-10-CM

## 2011-01-04 LAB — POCT INR: INR: 2.8

## 2011-02-01 ENCOUNTER — Ambulatory Visit (INDEPENDENT_AMBULATORY_CARE_PROVIDER_SITE_OTHER): Payer: Medicaid Other | Admitting: *Deleted

## 2011-02-01 DIAGNOSIS — I4891 Unspecified atrial fibrillation: Secondary | ICD-10-CM

## 2011-02-01 DIAGNOSIS — Z7901 Long term (current) use of anticoagulants: Secondary | ICD-10-CM

## 2011-02-01 LAB — POCT INR: INR: 2.8

## 2011-03-01 ENCOUNTER — Ambulatory Visit (INDEPENDENT_AMBULATORY_CARE_PROVIDER_SITE_OTHER): Payer: Medicaid Other | Admitting: *Deleted

## 2011-03-01 DIAGNOSIS — Z7901 Long term (current) use of anticoagulants: Secondary | ICD-10-CM

## 2011-03-01 DIAGNOSIS — I4891 Unspecified atrial fibrillation: Secondary | ICD-10-CM

## 2011-04-12 ENCOUNTER — Ambulatory Visit (INDEPENDENT_AMBULATORY_CARE_PROVIDER_SITE_OTHER): Payer: Medicaid Other | Admitting: *Deleted

## 2011-04-12 DIAGNOSIS — I4891 Unspecified atrial fibrillation: Secondary | ICD-10-CM

## 2011-04-12 DIAGNOSIS — Z7901 Long term (current) use of anticoagulants: Secondary | ICD-10-CM

## 2011-04-12 LAB — POCT INR: INR: 2

## 2011-04-21 ENCOUNTER — Other Ambulatory Visit: Payer: Self-pay | Admitting: Cardiology

## 2011-05-02 ENCOUNTER — Ambulatory Visit (HOSPITAL_COMMUNITY)
Admission: RE | Admit: 2011-05-02 | Discharge: 2011-05-02 | Disposition: A | Discharge status: Home / Self Care | Payer: Medicaid Other | Source: Ambulatory Visit | Attending: Pulmonary Disease | Admitting: Pulmonary Disease

## 2011-05-02 ENCOUNTER — Other Ambulatory Visit (HOSPITAL_COMMUNITY): Payer: Self-pay | Admitting: Pulmonary Disease

## 2011-05-02 DIAGNOSIS — M25552 Pain in left hip: Secondary | ICD-10-CM

## 2011-05-02 DIAGNOSIS — S79929A Unspecified injury of unspecified thigh, initial encounter: Secondary | ICD-10-CM | POA: Insufficient documentation

## 2011-05-02 DIAGNOSIS — W19XXXA Unspecified fall, initial encounter: Secondary | ICD-10-CM | POA: Insufficient documentation

## 2011-05-02 DIAGNOSIS — M25559 Pain in unspecified hip: Secondary | ICD-10-CM | POA: Insufficient documentation

## 2011-05-02 DIAGNOSIS — S79919A Unspecified injury of unspecified hip, initial encounter: Secondary | ICD-10-CM | POA: Insufficient documentation

## 2011-05-15 ENCOUNTER — Encounter: Payer: Self-pay | Admitting: Orthopedic Surgery

## 2011-05-15 ENCOUNTER — Ambulatory Visit (INDEPENDENT_AMBULATORY_CARE_PROVIDER_SITE_OTHER): Payer: Medicaid Other | Admitting: Orthopedic Surgery

## 2011-05-15 DIAGNOSIS — IMO0002 Reserved for concepts with insufficient information to code with codable children: Secondary | ICD-10-CM

## 2011-05-15 DIAGNOSIS — M79609 Pain in unspecified limb: Secondary | ICD-10-CM

## 2011-05-15 DIAGNOSIS — M79605 Pain in left leg: Secondary | ICD-10-CM

## 2011-05-15 NOTE — Patient Instructions (Signed)
PT

## 2011-05-15 NOTE — Progress Notes (Signed)
  Subjective:    Jasmin Martin is a 56 y.o. female who presents has a consultation from [name] regarding LEFT hip pain for 8 weeks  The patient complains of sudden onset of LEFT hip pain and locking sensation after getting out of a chair.  The pain is dull throbbing and intermittent reaching the level of 8/10.  She has a history of lumbar disc disease chronic but no surgery  Pain occurs basically all the time   The following portions of the patient's history were reviewed and updated as appropriate: allergies, current medications, past family history, past medical history, past social history, past surgical history and problem list.   Review of Systems A comprehensive review of systems was negative except for: fatigue, chest pain, shortness of breath, and wheezing and tightness of the chest.  Diarrhea.  Frequency.  Dizziness.  Anxiety and depression.  Easy bleeding easy bruising excessive thirst.   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  Recommend physical therapy if tolerated.  This should resolve in time with the understanding that acute exacerbations will occur.  Based on her atrial fibrillation and her weight she would not be a candidate for surgery  and will have to be managed nonoperatively.

## 2011-05-22 ENCOUNTER — Ambulatory Visit (HOSPITAL_COMMUNITY)
Admission: RE | Admit: 2011-05-22 | Discharge: 2011-05-22 | Disposition: A | Discharge status: Home / Self Care | Payer: Medicaid Other | Source: Ambulatory Visit | Attending: Orthopedic Surgery | Admitting: Orthopedic Surgery

## 2011-05-22 DIAGNOSIS — R29898 Other symptoms and signs involving the musculoskeletal system: Secondary | ICD-10-CM | POA: Insufficient documentation

## 2011-05-22 DIAGNOSIS — M25559 Pain in unspecified hip: Secondary | ICD-10-CM | POA: Insufficient documentation

## 2011-05-22 DIAGNOSIS — M545 Low back pain, unspecified: Secondary | ICD-10-CM | POA: Insufficient documentation

## 2011-05-22 DIAGNOSIS — M6281 Muscle weakness (generalized): Secondary | ICD-10-CM | POA: Insufficient documentation

## 2011-05-22 DIAGNOSIS — I10 Essential (primary) hypertension: Secondary | ICD-10-CM | POA: Insufficient documentation

## 2011-05-22 DIAGNOSIS — IMO0001 Reserved for inherently not codable concepts without codable children: Secondary | ICD-10-CM | POA: Insufficient documentation

## 2011-05-22 DIAGNOSIS — R262 Difficulty in walking, not elsewhere classified: Secondary | ICD-10-CM | POA: Insufficient documentation

## 2011-05-22 NOTE — Evaluation (Signed)
Physical Therapy Evaluation  Patient Details  Name: Jasmin Martin MRN: [id number] Date of Birth: 27-Dec-1955  Today's Date: 05/22/2011 Time: 4098-1191 Time Calculation (min): 54 min  Visit#: 1  of 3   Re-eval: 06/01/11 Assessment Diagnosis: L Leg pain with DDD Next MD Visit: 07/01/11 Prior Therapy: none  Past Medical History:  Past Medical History  Diagnosis Date  . Persistent atrial fibrillation   . Obesity, morbid   . HTN (hypertension)   . DJD (degenerative joint disease)   . Rheumatic fever     denies  h/o  . Arthritis   . Diabetes mellitus type II     denies h/o And CVA   . Insomnia   . Depression   . Bilateral knee pain    Past Surgical History:  Past Surgical History  Procedure Date  . Bilateral knee surgery   . Vesicovaginal fistula closure w/ tah   . Tonsillectomy   . Right ankle surgery   . Cardiac catheterization 1990's     normal per patient   . Cholecystectomy 2001  . Cesarean section     Subjective Symptoms/Limitations Symptoms: [name] states that she has been having pain in he L hip for about eight weeks.  She states the onset was sudden and the pain is constant. She states that she has fallen twice now after her leg would not move.   X-ray revealed no significant findings and the patient does have hx of DJD in her low back, therefore, she is being referred to therapy for education in back care and exercises.   How long can you sit comfortably?: She states she is able to sit for twenty minutes comfortably. How long can you stand comfortably?: The patient states that she will have increased pain in her hip after ten minutes. How long can you walk comfortably?: The patient is walking with a quad cane and does not want to walk very far due to her A-Fib. The most she walks is for less than five minutes.   Special Tests: The patient states that she wakes up approximately three times a night. Pain Assessment Currently in Pain?: Yes Pain Score:   5 (with  pain meds; 10/10 worst; 5/10 is the best.) Pain Location: Hip Pain Orientation: Left Pain Type: Chronic pain Pain Onset: More than a month ago Pain Frequency: Intermittent Pain Relieving Factors: pain meds and changing positions.   Prior Functioning  Home Living Lives With: Family Receives Help From: Family Type of Home: House Home Layout: One level Home Access: Stairs to enter Entrance Stairs-Rails: Can reach both Entrance Stairs-Number of Steps: 5 Prior Function Able to Take Stairs?: No Driving: Yes Vocation: On disability Leisure: Hobbies-yes (Comment) Comments: gym  Cognition/Observation Cognition Overall Cognitive Status: Appears within functional limits for tasks assessed Arousal/Alertness: Awake/alert   Assessment RLE Strength Right Hip Flexion: 2+/5 Right Hip Extension: 4/5 Right Hip ABduction: 5/5 Right Hip ADduction: 2+/5 Right Knee Flexion: 5/5 Right Knee Extension: 3+/5 Right Ankle Dorsiflexion: 3+/5 LLE Strength Left Hip Flexion: 2+/5 Left Hip Extension: 4/5 Left Hip ABduction: 2+/5 Left Hip ADduction: 2+/5 Left Knee Flexion: 5/5 Left Knee Extension: 4/5 Left Ankle Dorsiflexion: 3+/5 Lumbar AROM Lumbar Flexion: decreased 30%l   Lumbar Extension:  (WFL with reps causing no change) Lumbar - Right Side Bend:  (decreased 20% with reps causing no change.) Lumbar - Left Side Bend: decreased 20% with no increase of pain Lumbar - Right Rotation: decreased 20%  Lumbar - Left Rotation: decreased 20%  Exercise/Treatments  Mobility/Balance  Posture/Postural Control Posture/Postural Control: Postural limitations Postural Limitations: forward head, increase kyphosis, rounded shoulders.  Stability Bridge: 10 reps Bent Knee Raise: 10 reps Ab Set: 10 reps    Physical Therapy Assessment and Plan PT Assessment and Plan Clinical Impression Statement: Pt with postural changes, poor core and LE strength who has began having L hip pain that is interfering with  normal ADL's . Pt will benefit from skilled pt for education in body mechanics and core stability exercises Rehab Potential: Fair PT Frequency: Min 2X/week PT Duration: 4 weeks PT Treatment/Interventions: Therapeutic activities;Patient/family education PT Plan: See two times a week for four weeks, ( medicaid will only pay for 3 visits pt will decide after this if she wants to continue.)  Next treatment add T-band, heel walkl, extremity flexion and clam ex.    Goals Home Exercise Program Pt will Perform Home Exercise Program: Independently PT Short Term Goals Time to Complete Short Term Goals: 2 weeks PT Short Term Goal 1: Pt to be able to walk ten minutes without increased pain. PT Short Term Goal 2: Pt pain to be no greater than a 7/10 PT Short Term Goal 3: Pt to be able to verbalize the importance of proper back posture and lifting techniques in decreasing back pain  Problem List Patient Active Problem List  Diagnoses  . HYPOKALEMIA  . OBESITY-MORBID (>100')  . ANXIETY STATE, UNSPECIFIED  . HYPERTENSION, BENIGN  . FIBRILLATION, ATRIAL  . PNEUMONIA  . INFLUENZA  . TENOSYNOVITIS OF FOOT AND ANKLE  . MUSCLE CRAMPS  . STRESS FRACTURE, FOOT  . PALPITATIONS  . DYSPNEA  . Long term current use of anticoagulant  . Weakness of both legs    PT - End of Session Activity Tolerance: Patient tolerated treatment well General Behavior During Session: [facility] for tasks performed Cognition: [facility] for tasks performed PT Plan of Care PT Home Exercise Plan: given Consulted and Agree with Plan of Care: Patient    Shamarr Faucett,CINDY 05/22/2011, 12:17 PM  Physician Documentation Your signature is required to indicate approval of the treatment plan as stated above.  Please sign and either send electronically or make a copy of this report for your files and return this physician signed original.   Please mark one 1.__approve of plan  2. ___approve of plan with the following  conditions.   ______________________________                                                          _____________________ Physician Signature                                                                                                             Date

## 2011-05-24 ENCOUNTER — Ambulatory Visit (INDEPENDENT_AMBULATORY_CARE_PROVIDER_SITE_OTHER): Payer: Medicaid Other | Admitting: *Deleted

## 2011-05-24 ENCOUNTER — Ambulatory Visit (HOSPITAL_COMMUNITY)
Admission: RE | Admit: 2011-05-24 | Discharge: 2011-05-24 | Disposition: A | Discharge status: Home / Self Care | Payer: Medicaid Other | Source: Ambulatory Visit | Attending: Pulmonary Disease | Admitting: Pulmonary Disease

## 2011-05-24 DIAGNOSIS — Z7901 Long term (current) use of anticoagulants: Secondary | ICD-10-CM

## 2011-05-24 DIAGNOSIS — I4891 Unspecified atrial fibrillation: Secondary | ICD-10-CM

## 2011-05-24 LAB — POCT INR: INR: 1.9

## 2011-05-24 NOTE — Progress Notes (Signed)
Physical Therapy Treatment Patient Details  Name: Jasmin Martin MRN: [id number] Date of Birth: June 09, 1955  Today's Date: 05/24/2011 Time: 4098-1191 Time Calculation (min): 42 min Visit#: 2  of 3   Re-eval: 06/01/11 Charges: Therex x 39'  Subjective: Symptoms/Limitations Symptoms: Pt states that her pain is about a 5/10 after taking a pain pill this morning. Pain Assessment Pain Score:   5 Pain Location: Hip Pain Orientation: Left   Exercise/Treatments Lumbar Exercises Scapular Retraction: 10 reps;Theraband Theraband Level (Scapular Retraction): Level 3 (Green) Row: 10 reps;Theraband Theraband Level (Row): Level 3 (Green) Shoulder Extension: 10 reps;Theraband Theraband Level (Shoulder Extension): Level 3 (Green) Stability Clam: 10 reps;Supine Dead Bug: 5 reps;Supine Bridge: 10 reps Bent Knee Raise: 10 reps Ab Set: 10 reps Straight Leg Raise: 10 reps;Supine Hip Abduction: 10 reps;Side-lying Heel Raises: 10 reps;Limitations Heel Raises Limitations: toe raise x 10 Lifting: Limitations Lifting Limitations: B UE lift 90-180 x 10 Wall Slides: Limitations Wall Slides Limitations: Heel walk w/ QC 1RT  Physical Therapy Assessment and Plan PT Assessment and Plan Clinical Impression Statement: Pt ambulates into therapy with quad cane on R side with prongs backward. Gilmer Mor was adjusted for pt. Pt presents with improved abdominal control with exercises. Pt had the most trouble with the dead bug exercise secondary to abdominal weakness. Began scapular exercises with minimal difficulty after multimodal cueing for form. Walked with pt over to [place] [facility] to insure proper gait mechanics and improve activity tolerance/strength. Pt was able to walk the distance with minimal difficulty. PT Plan: Continue to progress per PT POC. Begin functional squats next session.     Problem List Patient Active Problem List  Diagnoses  . HYPOKALEMIA  . OBESITY-MORBID (>100')  . ANXIETY STATE,  UNSPECIFIED  . HYPERTENSION, BENIGN  . FIBRILLATION, ATRIAL  . PNEUMONIA  . INFLUENZA  . TENOSYNOVITIS OF FOOT AND ANKLE  . MUSCLE CRAMPS  . STRESS FRACTURE, FOOT  . PALPITATIONS  . DYSPNEA  . Long term current use of anticoagulant  . Weakness of both legs    PT - End of Session Activity Tolerance: Patient tolerated treatment well General Behavior During Session: [facility] for tasks performed Cognition: [facility] for tasks performed   Antonieta Iba 05/24/2011, 11:19 AM

## 2011-05-29 ENCOUNTER — Telehealth (HOSPITAL_COMMUNITY): Payer: Self-pay

## 2011-05-29 ENCOUNTER — Inpatient Hospital Stay (HOSPITAL_COMMUNITY): Admission: RE | Admit: 2011-05-29 | Payer: Medicaid Other | Source: Ambulatory Visit | Admitting: *Deleted

## 2011-05-31 ENCOUNTER — Telehealth (HOSPITAL_COMMUNITY): Payer: Self-pay

## 2011-05-31 ENCOUNTER — Ambulatory Visit (HOSPITAL_COMMUNITY): Payer: Medicaid Other | Admitting: *Deleted

## 2011-06-21 ENCOUNTER — Ambulatory Visit (INDEPENDENT_AMBULATORY_CARE_PROVIDER_SITE_OTHER): Payer: Medicaid Other | Admitting: *Deleted

## 2011-06-21 DIAGNOSIS — Z7901 Long term (current) use of anticoagulants: Secondary | ICD-10-CM

## 2011-06-21 DIAGNOSIS — I4891 Unspecified atrial fibrillation: Secondary | ICD-10-CM

## 2011-06-21 LAB — POCT INR: INR: 1.7

## 2011-07-04 ENCOUNTER — Telehealth: Payer: Self-pay | Admitting: *Deleted

## 2011-07-04 NOTE — Telephone Encounter (Signed)
Spoke with pt.  Has been bruising easily.  Is on ceftin 500mg  bid for cold.  She started on 07/02/11.  Told pt to hold coumadin tonight and come in for INR check tomorrow.  Pt verbalized understanding.

## 2011-07-04 NOTE — Telephone Encounter (Signed)
PATIENT NEEDS TO TALK TO YOU REGARDING COUMADIN DOSING DUE TO INCREASED BRUISING

## 2011-07-05 ENCOUNTER — Ambulatory Visit (INDEPENDENT_AMBULATORY_CARE_PROVIDER_SITE_OTHER): Payer: Medicaid Other | Admitting: *Deleted

## 2011-07-05 DIAGNOSIS — I4891 Unspecified atrial fibrillation: Secondary | ICD-10-CM

## 2011-07-05 DIAGNOSIS — Z7901 Long term (current) use of anticoagulants: Secondary | ICD-10-CM

## 2011-07-16 ENCOUNTER — Ambulatory Visit (INDEPENDENT_AMBULATORY_CARE_PROVIDER_SITE_OTHER): Payer: Medicaid Other | Admitting: *Deleted

## 2011-07-16 DIAGNOSIS — I4891 Unspecified atrial fibrillation: Secondary | ICD-10-CM

## 2011-07-16 DIAGNOSIS — Z7901 Long term (current) use of anticoagulants: Secondary | ICD-10-CM

## 2011-07-16 LAB — POCT INR: INR: 3.9

## 2011-07-30 ENCOUNTER — Emergency Department (HOSPITAL_COMMUNITY): Payer: Medicaid Other

## 2011-07-30 ENCOUNTER — Ambulatory Visit (INDEPENDENT_AMBULATORY_CARE_PROVIDER_SITE_OTHER): Payer: Medicaid Other | Admitting: *Deleted

## 2011-07-30 ENCOUNTER — Inpatient Hospital Stay (HOSPITAL_COMMUNITY)
Admission: EM | Admit: 2011-07-30 | Discharge: 2011-08-01 | DRG: 308 | Disposition: A | Discharge status: Home Health | Payer: Medicaid Other | Attending: Pulmonary Disease | Admitting: Pulmonary Disease

## 2011-07-30 ENCOUNTER — Encounter (HOSPITAL_COMMUNITY): Payer: Self-pay | Admitting: *Deleted

## 2011-07-30 DIAGNOSIS — F3289 Other specified depressive episodes: Secondary | ICD-10-CM | POA: Diagnosis present

## 2011-07-30 DIAGNOSIS — M1990 Unspecified osteoarthritis, unspecified site: Secondary | ICD-10-CM | POA: Diagnosis present

## 2011-07-30 DIAGNOSIS — R0602 Shortness of breath: Secondary | ICD-10-CM

## 2011-07-30 DIAGNOSIS — I4891 Unspecified atrial fibrillation: Secondary | ICD-10-CM

## 2011-07-30 DIAGNOSIS — I5033 Acute on chronic diastolic (congestive) heart failure: Secondary | ICD-10-CM | POA: Diagnosis present

## 2011-07-30 DIAGNOSIS — Z6841 Body Mass Index (BMI) 40.0 and over, adult: Secondary | ICD-10-CM

## 2011-07-30 DIAGNOSIS — F329 Major depressive disorder, single episode, unspecified: Secondary | ICD-10-CM | POA: Diagnosis present

## 2011-07-30 DIAGNOSIS — Z7901 Long term (current) use of anticoagulants: Secondary | ICD-10-CM

## 2011-07-30 DIAGNOSIS — I509 Heart failure, unspecified: Secondary | ICD-10-CM | POA: Diagnosis present

## 2011-07-30 DIAGNOSIS — I5032 Chronic diastolic (congestive) heart failure: Secondary | ICD-10-CM | POA: Diagnosis present

## 2011-07-30 DIAGNOSIS — I10 Essential (primary) hypertension: Secondary | ICD-10-CM | POA: Diagnosis present

## 2011-07-30 LAB — COMPREHENSIVE METABOLIC PANEL
AST: 21 U/L (ref 0–37)
Albumin: 3.7 g/dL (ref 3.5–5.2)
Alkaline Phosphatase: 110 U/L (ref 39–117)
Chloride: 101 mEq/L (ref 96–112)
Potassium: 3.6 mEq/L (ref 3.5–5.1)
Total Bilirubin: 1 mg/dL (ref 0.3–1.2)

## 2011-07-30 LAB — PRO B NATRIURETIC PEPTIDE: Pro B Natriuretic peptide (BNP): 519.7 pg/mL — ABNORMAL HIGH (ref 0–125)

## 2011-07-30 LAB — CBC
MCHC: 32.2 g/dL (ref 30.0–36.0)
Platelets: 219 10*3/uL (ref 150–400)
RDW: 13.9 % (ref 11.5–15.5)

## 2011-07-30 LAB — MRSA PCR SCREENING: MRSA by PCR: POSITIVE — AB

## 2011-07-30 LAB — CARDIAC PANEL(CRET KIN+CKTOT+MB+TROPI)
CK, MB: 1.8 ng/mL (ref 0.3–4.0)
Relative Index: INVALID (ref 0.0–2.5)
Relative Index: INVALID (ref 0.0–2.5)
Troponin I: <0.3 ng/mL (ref <0.30)

## 2011-07-30 LAB — PROTIME-INR: INR: 2.34 — ABNORMAL HIGH (ref 0.00–1.49)

## 2011-07-30 LAB — DIFFERENTIAL
Basophils Absolute: 0 10*3/uL (ref 0.0–0.1)
Basophils Relative: 0 % (ref 0–1)
Neutro Abs: 4 10*3/uL (ref 1.7–7.7)
Neutrophils Relative %: 59 % (ref 43–77)

## 2011-07-30 LAB — POCT INR: INR: 2.9

## 2011-07-30 MED ORDER — METOPROLOL SUCCINATE ER 50 MG PO TB24
100.0000 mg | ORAL_TABLET | Freq: Every day | ORAL | Status: DC
  Administered 2011-07-31 – 2011-08-01 (×2): 100 mg via ORAL
  Filled 2011-07-30 (×2): qty 2

## 2011-07-30 MED ORDER — DILTIAZEM HCL 50 MG/10ML IV SOLN: 10.0000 mg | Freq: Once | INTRAVENOUS | Status: DC

## 2011-07-30 MED ORDER — ACETAMINOPHEN 325 MG PO TABS: 650.0000 mg | ORAL_TABLET | Freq: Four times a day (QID) | ORAL | Status: DC | PRN

## 2011-07-30 MED ORDER — ACETAMINOPHEN 650 MG RE SUPP: 650.0000 mg | Freq: Four times a day (QID) | RECTAL | Status: DC | PRN

## 2011-07-30 MED ORDER — MUPIROCIN 2 % EX OINT
1.0000 "application " | TOPICAL_OINTMENT | Freq: Two times a day (BID) | CUTANEOUS | Status: DC
  Administered 2011-07-30 – 2011-08-01 (×4): 1 via NASAL
  Filled 2011-07-30: qty 22

## 2011-07-30 MED ORDER — SODIUM CHLORIDE 0.9 % IJ SOLN
3.0000 mL | Freq: Two times a day (BID) | INTRAMUSCULAR | Status: DC
  Administered 2011-07-30 – 2011-07-31 (×3): 3 mL via INTRAVENOUS
  Filled 2011-07-30 (×2): qty 3

## 2011-07-30 MED ORDER — DILTIAZEM HCL 100 MG IV SOLR
5.0000 mg/h | INTRAVENOUS | Status: DC
  Administered 2011-07-30: 15 mg/h via INTRAVENOUS
  Administered 2011-07-30: 5 mg/h via INTRAVENOUS
  Filled 2011-07-30: qty 100

## 2011-07-30 MED ORDER — WARFARIN - PHARMACIST DOSING INPATIENT: Freq: Every day | Status: DC

## 2011-07-30 MED ORDER — ALPRAZOLAM 0.5 MG PO TABS: 1.0000 mg | ORAL_TABLET | Freq: Three times a day (TID) | ORAL | Status: DC | PRN

## 2011-07-30 MED ORDER — ZOLPIDEM TARTRATE 5 MG PO TABS: 5.0000 mg | ORAL_TABLET | Freq: Every evening | ORAL | Status: DC | PRN

## 2011-07-30 MED ORDER — OXYCODONE HCL 5 MG PO TABS
5.0000 mg | ORAL_TABLET | ORAL | Status: DC | PRN
  Administered 2011-07-30 – 2011-08-01 (×5): 5 mg via ORAL
  Filled 2011-07-30 (×5): qty 1

## 2011-07-30 MED ORDER — FUROSEMIDE 10 MG/ML IJ SOLN
40.0000 mg | Freq: Two times a day (BID) | INTRAMUSCULAR | Status: DC
  Administered 2011-07-30 – 2011-08-01 (×4): 40 mg via INTRAVENOUS
  Filled 2011-07-30 (×4): qty 4

## 2011-07-30 MED ORDER — DILTIAZEM HCL 100 MG IV SOLR
5.0000 mg/h | Freq: Once | INTRAVENOUS | Status: AC
  Administered 2011-07-30: 10 mg/h via INTRAVENOUS
  Filled 2011-07-30: qty 100

## 2011-07-30 MED ORDER — ASPIRIN 81 MG PO CHEW
81.0000 mg | CHEWABLE_TABLET | Freq: Every day | ORAL | Status: DC
Start: 2011-07-31 — End: 2011-08-01
  Administered 2011-07-31 – 2011-08-01 (×2): 81 mg via ORAL
  Filled 2011-07-30 (×2): qty 1

## 2011-07-30 MED ORDER — DILTIAZEM HCL 25 MG/5ML IV SOLN
20.0000 mg | Freq: Once | INTRAVENOUS | Status: AC
  Administered 2011-07-30: 20 mg via INTRAVENOUS

## 2011-07-30 MED ORDER — LISINOPRIL 10 MG PO TABS
20.0000 mg | ORAL_TABLET | Freq: Every day | ORAL | Status: DC
  Administered 2011-07-31 – 2011-08-01 (×2): 20 mg via ORAL
  Filled 2011-07-30 (×2): qty 2

## 2011-07-30 MED ORDER — ONDANSETRON HCL 4 MG/2ML IJ SOLN: 4.0000 mg | Freq: Three times a day (TID) | INTRAMUSCULAR | Status: AC | PRN

## 2011-07-30 MED ORDER — DILTIAZEM LOAD VIA INFUSION
10.0000 mg | Freq: Once | INTRAVENOUS | Status: DC
  Filled 2011-07-30: qty 10

## 2011-07-30 MED ORDER — CHLORHEXIDINE GLUCONATE CLOTH 2 % EX PADS
6.0000 | MEDICATED_PAD | Freq: Every day | CUTANEOUS | Status: DC
  Administered 2011-07-31 – 2011-08-01 (×2): 6 via TOPICAL

## 2011-07-30 MED ORDER — SODIUM CHLORIDE 0.9 % IV SOLN
INTRAVENOUS | Status: AC
  Administered 2011-07-30: 15:00:00 via INTRAVENOUS

## 2011-07-30 MED ORDER — SODIUM CHLORIDE 0.9 % IJ SOLN
INTRAMUSCULAR | Status: AC
  Administered 2011-07-30: 19:00:00
  Filled 2011-07-30: qty 6

## 2011-07-30 MED ORDER — WARFARIN SODIUM 5 MG PO TABS
5.0000 mg | ORAL_TABLET | Freq: Once | ORAL | Status: AC
  Administered 2011-07-30: 5 mg via ORAL
  Filled 2011-07-30: qty 1

## 2011-07-30 NOTE — ED Notes (Signed)
Sob since Thursday and  Increased today.  Went to [facility] for coumadin check and sent here for sob  And increased heart rate.

## 2011-07-30 NOTE — Progress Notes (Signed)
ANTICOAGULATION CONSULT NOTE - Initial Consult  Pharmacy Consult for Warfarin Indication: atrial fibrillation  No Known Allergies  Patient Measurements: Height: 5\' 4"  (162.6 cm) Weight: 346 lb 12.5 oz (157.3 kg) IBW/kg (Calculated) : 54.7    Vital Signs: Temp: 97.7 F (36.5 C) (04/29 1600) Temp src: Oral (04/29 1600) BP: 121/98 mmHg (04/29 1745) Pulse Rate: 50  (04/29 1745)  Labs:  Basename 07/30/11 1130 07/30/11 1058  HGB 12.9 --  HCT 40.1 --  PLT 219 --  APTT -- --  LABPROT 26.0* --  INR 2.34* 2.9  HEPARINUNFRC -- --  CREATININE 0.65 --  CKTOTAL 57 --  CKMB 1.8 --  TROPONINI <0.30 --   Estimated Creatinine Clearance: 115.8 ml/min (by C-G formula based on Cr of 0.65).  Medical History: Past Medical History  Diagnosis Date  . Persistent atrial fibrillation   . Obesity, morbid   . HTN (hypertension)   . DJD (degenerative joint disease)   . Rheumatic fever     denies  h/o  . Arthritis   . Insomnia   . Depression   . Bilateral knee pain     Medications:  Prescriptions prior to admission  Medication Sig Dispense Refill  . ALPRAZolam (XANAX) 1 MG tablet Take 1 mg by mouth as needed. Anxiety      . aspirin (ASPIRIN LOW DOSE) 81 MG EC tablet Take 81 mg by mouth daily.        Marland Kitchen diltiazem (TIAZAC) 240 MG 24 hr capsule Take 240 mg by mouth daily.      . furosemide (LASIX) 40 MG tablet Take 40 mg by mouth every other day.      . lisinopril (PRINIVIL,ZESTRIL) 20 MG tablet Take 20 mg by mouth daily.      Marland Kitchen loperamide (IMODIUM) 2 MG capsule Take 2 mg by mouth 4 (four) times daily as needed. Diarrhea      . metoprolol succinate (TOPROL-XL) 100 MG 24 hr tablet Take 100 mg by mouth daily. Take with or immediately following a meal.      . oxyCODONE-acetaminophen (PERCOCET) 10-325 MG per tablet Take 1 tablet by mouth every 6 (six) hours.      Marland Kitchen warfarin (COUMADIN) 5 MG tablet Take 5-7.5 mg by mouth daily. Takes 5 mg on Mon, Wed, and Fri. All other days patient takes 7.5  mg.        Assessment: Okay for Protocol  Goal of Therapy:  INR 2-3   Plan:  Warfarin 5mg  PO x 1. Daily PT/INR.  Mady Gemma 07/30/2011,6:20 PM

## 2011-07-30 NOTE — ED Provider Notes (Signed)
History   This chart was scribed for Glynn Octave, MD by Clarita Crane. The patient was seen in room APA02/APA02. Patient's care was started at 1108.    CSN: [id number]  Arrival date & time 07/30/11  1108   First MD Initiated Contact with Patient 07/30/11 1122      Chief Complaint  Patient presents with  . Shortness of Breath    (Consider location/radiation/quality/duration/timing/severity/associated sxs/prior treatment) HPI Jasmin Martin is a 56 y.o. female who presents to the Emergency Department complaining of moderate to severe SOB onset 3 days ago and gradually worsening since with 1 episode of nausea and vomiting 2 days ago. Patient notes SOB was initially moderate and intermittent 3 days ago but became constant and notably worse this morning. Also reports experiencing 2 episodes of sharp pain to back after SOB became constant this morning. Describes current SOB as more severe than that generally related to atrial fibrillation. Denies chest pain, HA, nausea, vomiting, diarrhea. Patient with h/o atrial fibrillation, HTN, DJD and is a non-smoker.  Past Medical History  Diagnosis Date  . Persistent atrial fibrillation   . Obesity, morbid   . HTN (hypertension)   . DJD (degenerative joint disease)   . Rheumatic fever     denies  h/o  . Arthritis   . Insomnia   . Depression   . Bilateral knee pain     Past Surgical History  Procedure Date  . Bilateral knee surgery   . Vesicovaginal fistula closure w/ tah   . Tonsillectomy   . Right ankle surgery   . Cholecystectomy 2001  . Cesarean section   . Abdominal hysterectomy     Family History  Problem Relation Age of Onset  . Cancer Father     Lung     History  Substance Use Topics  . Smoking status: Never Smoker   . Smokeless tobacco: Never Used  . Alcohol Use: No    OB History    Grav Para Term Preterm Abortions TAB SAB Ect Mult Living                  Review of Systems A complete 10 system review of  systems was obtained and all systems are negative except as noted in the HPI and PMH.   Allergies  Review of patient's allergies indicates no known allergies.  Home Medications   Current Outpatient Rx  Name Route Sig Dispense Refill  . ALPRAZOLAM 1 MG PO TABS Oral Take 1 mg by mouth as needed.      . ASPIRIN 81 MG PO TBEC Oral Take 81 mg by mouth daily.      Marland Kitchen COUMADIN 5 MG PO TABS  TAKE 7.5MG  DAILY EXCEPT MON., WED., AND FRI. TAKE 5MG . 60 each 3  . DILTIAZEM HCL ER BEADS PO Oral Take 240 mg by mouth.      . FUROSEMIDE 40 MG PO TABS  TAKE ONE TABLET DAILY. 30 tablet 0  . OXYCODONE-ACETAMINOPHEN 5-325 MG PO TABS Oral Take 1 tablet by mouth 4 (four) times daily as needed. Take 1 tablet by mouth four times a day as needed     . PRINIVIL 20 MG PO TABS  TAKE ONE TABLET DAILY. 30 each 6  . SOLIFENACIN SUCCINATE 5 MG PO TABS Oral Take 5 mg by mouth daily.      . TOPROL XL 100 MG PO TB24  TAKE (1) TABLET BY MOUTH EACH MORNING. 30 each 6    BP 140/104  Pulse 106  Temp(Src) 97.7 F (36.5 C) (Oral)  Resp 30  Ht 5' 3.5" (1.613 m)  Wt 330 lb (149.687 kg)  BMI 57.54 kg/m2  SpO2 92%  Physical Exam  Nursing note and vitals reviewed. Constitutional: She is oriented to person, place, and time. She appears well-developed and well-nourished. No distress.       Obese.   HENT:  Head: Normocephalic and atraumatic.  Eyes: EOM are normal. Pupils are equal, round, and reactive to light.  Neck: Neck supple. No tracheal deviation present.  Cardiovascular: An irregularly irregular rhythm present. Tachycardia present.  Exam reveals no gallop and no friction rub.   No murmur heard. Pulmonary/Chest: Effort normal. No respiratory distress. She has no wheezes. She has no rales.  Abdominal: Soft. She exhibits no distension.       Obese  Musculoskeletal: Normal range of motion. She exhibits no edema.  Neurological: She is alert and oriented to person, place, and time. No sensory deficit.  Skin: Skin is warm  and dry.  Psychiatric: She has a normal mood and affect. Her behavior is normal.    ED Course  Procedures (including critical care time)  DIAGNOSTIC STUDIES: Oxygen Saturation is 92% on room air, low by my interpretation.    COORDINATION OF CARE: 11:30AM- Patient informed of current plan for treatment and evaluation and agrees with plan at this time.  12:07PM- Patient informed of current clinical impression and intent to admit patient for observation. Patient agrees with plan.  12:16PM- consult complete with Dr. Juanetta Gosling. Patient case explained and discussed. Dr. Juanetta Gosling agrees to admit patient.  Labs Reviewed  COMPREHENSIVE METABOLIC PANEL - Abnormal; Notable for the following:    Glucose, Bld 135 (*)    All other components within normal limits  PROTIME-INR - Abnormal; Notable for the following:    Prothrombin Time 26.0 (*)    INR 2.34 (*)    All other components within normal limits  PRO B NATRIURETIC PEPTIDE - Abnormal; Notable for the following:    Pro B Natriuretic peptide (BNP) 519.7 (*)    All other components within normal limits  CBC  DIFFERENTIAL  CARDIAC PANEL(CRET KIN+CKTOT+MB+TROPI)  D-DIMER, QUANTITATIVE   Dg Chest Portable 1 View  07/30/2011  *RADIOLOGY REPORT*  Clinical Data: Shortness of breath.  Persistent atrial fibrillation.  PORTABLE CHEST - 1 VIEW  Comparison: Chest x-ray 07/18/2009.  Findings: Cardiopericardial silhouette is mildly enlarged (unchanged).  There is cephalization of the pulmonary vasculature and indistinctness of the interstitial markings with some Kerley B lines in the periphery of the lungs, most compatible with mild interstitial pulmonary edema.  No definite pleural effusions. Mediastinal contours are otherwise unremarkable.  IMPRESSION: 1.  Findings, as above, most suggestive of mild congestive heart failure.  Original Report Authenticated By: Florencia Reasons, M.D.     1. Atrial fibrillation with rapid ventricular response       MDM   History of atrial fibrillation presenting with shortness of breath has been intermittent for the past 3 days and rapid heart rate. Atrial fibrillation with rapid ventricular response. No chest pain.  20 mg Cardizem bolus given for A. fib with RVR. Patient remains tachypneic her blood pressure stable 130s 140s.  Heart rate appropriately decreased to 101 teens with medications and to maintain a 10 mg per hour. INR therapeutic at 2.3.   Admission discussed with Dr. Juanetta Gosling.   Date: 07/30/2011  Rate: 152  Rhythm: atrial fibrillation  QRS Axis: right  Intervals: normal  ST/T Wave  abnormalities: nonspecific ST/T changes  Conduction Disutrbances:none  Narrative Interpretation: now Afib with RVR  Old EKG Reviewed: changes noted  CRITICAL CARE Performed by: Glynn Octave   Total critical care time: 35  Critical care time was exclusive of separately billable procedures and treating other patients.  Critical care was necessary to treat or prevent imminent or life-threatening deterioration.  Critical care was time spent personally by me on the following activities: development of treatment plan with patient and/or surrogate as well as nursing, discussions with consultants, evaluation of patient's response to treatment, examination of patient, obtaining history from patient or surrogate, ordering and performing treatments and interventions, ordering and review of laboratory studies, ordering and review of radiographic studies, pulse oximetry and re-evaluation of patient's condition.     I personally performed the services described in this documentation, which was scribed in my presence.  The recorded information has been reviewed and considered.    Glynn Octave, MD 07/30/11 [phone]

## 2011-07-30 NOTE — H&P (Signed)
Jasmin Martin MRN: [id number] DOB/AGE: Dec 30, 1955 56 y.o. Primary Care Physician:Bruce Churilla L, MD, MD Admit date: 07/30/2011 Chief Complaint: Shortness of breath HPI: She started having problems with shortness of breath about 3 days ago. She knew that she had an appointment to be seen in the Coumadin clinic so she tried to wait until she could be seen. When she was seen in the Coumadin clinic which she was found to have a heart rate of about 130 and she was sent to the emergency room. In the emergency room she was in atrial fibrillation and rapid ventricular response and her chest x-ray suggested CHF. She does have an elevated pro BNP.  Past Medical History  Diagnosis Date  . Persistent atrial fibrillation   . Obesity, morbid   . HTN (hypertension)   . DJD (degenerative joint disease)   . Rheumatic fever     denies  h/o  . Arthritis   . Insomnia   . Depression   . Bilateral knee pain    Past Surgical History  Procedure Date  . Bilateral knee surgery   . Vesicovaginal fistula closure w/ tah   . Tonsillectomy   . Right ankle surgery   . Cholecystectomy 2001  . Cesarean section   . Abdominal hysterectomy         Family History  Problem Relation Age of Onset  . Cancer Father     Lung     Social History:  reports that she has never smoked. She has never used smokeless tobacco. She reports that she does not drink alcohol or use illicit drugs.   Allergies: No Known Allergies  Medications Prior to Admission  Medication Sig Dispense Refill  . ALPRAZolam (XANAX) 1 MG tablet Take 1 mg by mouth as needed. Anxiety      . aspirin (ASPIRIN LOW DOSE) 81 MG EC tablet Take 81 mg by mouth daily.        Marland Kitchen diltiazem (TIAZAC) 240 MG 24 hr capsule Take 240 mg by mouth daily.      . furosemide (LASIX) 40 MG tablet Take 40 mg by mouth every other day.      . lisinopril (PRINIVIL,ZESTRIL) 20 MG tablet Take 20 mg by mouth daily.      Marland Kitchen loperamide (IMODIUM) 2 MG capsule Take 2 mg by mouth  4 (four) times daily as needed. Diarrhea      . metoprolol succinate (TOPROL-XL) 100 MG 24 hr tablet Take 100 mg by mouth daily. Take with or immediately following a meal.      . oxyCODONE-acetaminophen (PERCOCET) 10-325 MG per tablet Take 1 tablet by mouth every 6 (six) hours.      Marland Kitchen warfarin (COUMADIN) 5 MG tablet Take 5-7.5 mg by mouth daily. Takes 5 mg on Mon, Wed, and Fri. All other days patient takes 7.5 mg.           WUJ:WJXBJ from the symptoms mentioned above,there are no other symptoms referable to all systems reviewed.  Physical Exam: Blood pressure 121/98, pulse 50, temperature 97.7 F (36.5 C), temperature source Oral, resp. rate 21, height 5\' 4"  (1.626 m), weight 157.3 kg (346 lb 12.5 oz), SpO2 97.00%. She is awake and alert. She is morbidly obese. Her heart rate about 1:30 still. Her pupils are reactive. Her nose and throat are clear. Her neck is supple without masses. Her chest is relatively clear with some rales in the bases. Her heart is irregularly irregular with a rate of about 115 to 1:30. Her  abdomen is soft. Extremities showed no edema. Central nervous system examination is grossly intact    Basename 07/30/11 1130  WBC 6.8  NEUTROABS 4.0  HGB 12.9  HCT 40.1  MCV 98.0  PLT 219    Basename 07/30/11 1130  NA 139  K 3.6  CL 101  CO2 27  GLUCOSE 135*  BUN 12  CREATININE 0.65  CALCIUM 8.9  MG --  lablast2(ast:2,ALT:2,alkphos:2,bilitot:2,prot:2,albumin:2)@    No results found for this or any previous visit (from the past 240 hour(s)).   Dg Chest Portable 1 View  07/30/2011  *RADIOLOGY REPORT*  Clinical Data: Shortness of breath.  Persistent atrial fibrillation.  PORTABLE CHEST - 1 VIEW  Comparison: Chest x-ray 07/18/2009.  Findings: Cardiopericardial silhouette is mildly enlarged (unchanged).  There is cephalization of the pulmonary vasculature and indistinctness of the interstitial markings with some Kerley B lines in the periphery of the lungs, most  compatible with mild interstitial pulmonary edema.  No definite pleural effusions. Mediastinal contours are otherwise unremarkable.  IMPRESSION: 1.  Findings, as above, most suggestive of mild congestive heart failure.  Original Report Authenticated By: Florencia Reasons, M.D.   Impression: She has atrial fibrillation and rapid ventricular response and seems to have developed CHF from that. Active Problems:  * No active hospital problems. *      Plan: She will be admitted to step down, started on Cardizem given Lasix and have echocardiogram and cardiology consultation she will have serial cardiac enzymes and EKGs      Letricia Krinsky L Pager [phone]  07/30/2011, 6:21 PM

## 2011-07-31 DIAGNOSIS — I5032 Chronic diastolic (congestive) heart failure: Secondary | ICD-10-CM | POA: Diagnosis present

## 2011-07-31 DIAGNOSIS — I4891 Unspecified atrial fibrillation: Principal | ICD-10-CM

## 2011-07-31 DIAGNOSIS — I517 Cardiomegaly: Secondary | ICD-10-CM

## 2011-07-31 DIAGNOSIS — Z7901 Long term (current) use of anticoagulants: Secondary | ICD-10-CM

## 2011-07-31 DIAGNOSIS — R0602 Shortness of breath: Secondary | ICD-10-CM

## 2011-07-31 DIAGNOSIS — I10 Essential (primary) hypertension: Secondary | ICD-10-CM

## 2011-07-31 LAB — CBC
HCT: 38.1 % (ref 36.0–46.0)
Hemoglobin: 12.6 g/dL (ref 12.0–15.0)
MCV: 98.7 fL (ref 78.0–100.0)
RDW: 14.1 % (ref 11.5–15.5)
WBC: 7.1 10*3/uL (ref 4.0–10.5)

## 2011-07-31 LAB — PROTIME-INR
INR: 2.29 — ABNORMAL HIGH (ref 0.00–1.49)
Prothrombin Time: 25.6 seconds — ABNORMAL HIGH (ref 11.6–15.2)

## 2011-07-31 LAB — CARDIAC PANEL(CRET KIN+CKTOT+MB+TROPI)
CK, MB: 1.8 ng/mL (ref 0.3–4.0)
Relative Index: INVALID (ref 0.0–2.5)
Troponin I: <0.3 ng/mL (ref <0.30)

## 2011-07-31 LAB — BASIC METABOLIC PANEL
CO2: 26 mEq/L (ref 19–32)
Chloride: 102 mEq/L (ref 96–112)
Creatinine, Ser: 0.55 mg/dL (ref 0.50–1.10)
GFR calc Af Amer: >90 mL/min (ref >90)
Potassium: 3.6 mEq/L (ref 3.5–5.1)

## 2011-07-31 MED ORDER — WARFARIN SODIUM 7.5 MG PO TABS
7.5000 mg | ORAL_TABLET | ORAL | Status: DC
  Administered 2011-07-31: 7.5 mg via ORAL
  Filled 2011-07-31: qty 1

## 2011-07-31 MED ORDER — DILTIAZEM HCL 60 MG PO TABS
90.0000 mg | ORAL_TABLET | Freq: Four times a day (QID) | ORAL | Status: DC
  Administered 2011-07-31 – 2011-08-01 (×4): 90 mg via ORAL
  Filled 2011-07-31 (×3): qty 1
  Filled 2011-07-31: qty 3

## 2011-07-31 MED ORDER — WARFARIN SODIUM 5 MG PO TABS: 5.0000 mg | ORAL_TABLET | ORAL | Status: DC

## 2011-07-31 NOTE — Progress Notes (Signed)
Reviewed low sodium diet information with patient and gave information to patient to review.

## 2011-07-31 NOTE — Consult Note (Signed)
CARDIOLOGY CONSULT NOTE  Patient ID: ALEENE SWANNER MRN: [id number] DOB/AGE: 1955/07/14 56 y.o.  Admit date: 07/30/2011 Referring Physician: Juanetta Gosling Primary Herb Grays, MD, MD Primary Cardiologist: Daleen Squibb Reason for Consultation: Atrial fib with RVR Active Problems:  FIBRILLATION, ATRIAL  HYPERTENSION, BENIGN  HPI: Mrs. Alba Destine is a morbidly obese female patient of Dr. Elijah Birk wall that he follows for atrial fibrillation, who is also seen in our Coumadin clinic for dosing adjustments. Chest has a history of hypertension, rheumatic fever without rheumatic heart disease, insomnia, depression, and anxiety. She was in her usual state of health until 3 days  ago when she had increasing shortness of breath, and fatigue. She felt her heart rate had increased. She denies any medical noncompliance. Admits to dietary noncompliance with salt and salty foods. She was seen in the coumadin clinic on 07/30/2011 with complaints of rapid heart rate and shortness of breath, and was directed to ER for evaluation. On arrival to ER she was found to be in atrial fibrillation with rate of 144 bpm. She was treated with diltiazem bolus of 10 mg and started on a cardizem drip at 20 mg/hr. She was also found to be in CHF with BNP of 519.7. CXR demonstrated mild interstitial pulmonary edema. She had been started on lasix 40 mg IV  BID. She had diuresed 2965 cc since discharge. Echocardiogram has been completed this am. Results are pending. Most recent ECHO in 2011 demonstrated normal EF.    She states that she had had two attempts of DCCV which have been unsuccessful. She has also been seen by Dr. Johney Frame in 10/2009 and had failed medical therapy with Tikosyn and Amiodarone. Per his note, "her anticipated success rate with catheter ablation would be 50-60% and would likely require more than one procedure. She exceeds my typical weight cut off for ablation of 300 lbs. I have therefore recommended rate control, anticoagulation,  and weight loss at this time."  She has not lost weight and is very deconditioned and easily fatigued.   Review of systems complete and found to be negative unless listed above   Past Medical History  Diagnosis Date  . Persistent atrial fibrillation   . Obesity, morbid   . HTN (hypertension)   . DJD (degenerative joint disease)   . Rheumatic fever     denies  h/o  . Arthritis   . Insomnia   . Depression   . Bilateral knee pain     Family History  Problem Relation Age of Onset  . Cancer Father     Lung     History   Social History  . Marital Status: Legally Separated    Spouse Name: N/A    Number of Children: N/A  . Years of Education: N/A   Occupational History  . Not on file.   Social History Main Topics  . Smoking status: Never Smoker   . Smokeless tobacco: Never Used  . Alcohol Use: No  . Drug Use: No  . Sexually Active: Yes    Birth Control/ Protection: Surgical   Other Topics Concern  . Not on file   Social History Narrative  . No narrative on file    Past Surgical History  Procedure Date  . Bilateral knee surgery   . Vesicovaginal fistula closure w/ tah   . Tonsillectomy   . Right ankle surgery   . Cholecystectomy 2001  . Cesarean section   . Abdominal hysterectomy      Prescriptions prior to admission  Medication Sig Dispense Refill  . ALPRAZolam (XANAX) 1 MG tablet Take 1 mg by mouth as needed. Anxiety      . aspirin (ASPIRIN LOW DOSE) 81 MG EC tablet Take 81 mg by mouth daily.        Marland Kitchen diltiazem (TIAZAC) 240 MG 24 hr capsule Take 240 mg by mouth daily.      . furosemide (LASIX) 40 MG tablet Take 40 mg by mouth every other day.      . lisinopril (PRINIVIL,ZESTRIL) 20 MG tablet Take 20 mg by mouth daily.      Marland Kitchen loperamide (IMODIUM) 2 MG capsule Take 2 mg by mouth 4 (four) times daily as needed. Diarrhea      . metoprolol succinate (TOPROL-XL) 100 MG 24 hr tablet Take 100 mg by mouth daily. Take with or immediately following a meal.      .  oxyCODONE-acetaminophen (PERCOCET) 10-325 MG per tablet Take 1 tablet by mouth every 6 (six) hours.      Marland Kitchen warfarin (COUMADIN) 5 MG tablet Take 5-7.5 mg by mouth daily. Takes 5 mg on Mon, Wed, and Fri. All other days patient takes 7.5 mg.       Echocardiogram: 07/20/2011 Left ventricle: The cavity size was normal. Wall thickness was increased in a pattern of moderate LVH. Systolic function was normal. The estimated ejection fraction was in the range of 60% to 65%. Wall motion was normal; there were no regional wall motion abnormalities. - Aortic valve: Mildly calcified annulus. Probably trileaflet; mildly calcified leaflets. No significant regurgitation. - Mitral valve: Calcified annulus. Trivial regurgitation. - Left atrium: The atrium was mildly dilated. - Tricuspid valve: Trivial regurgitation. - Pericardium, extracardiac: There was no pericardial effusion.   Physical Exam: Blood pressure 126/103, pulse 45, temperature 97.6 F (36.4 C), temperature source Oral, resp. rate 18, height 5\' 4"  (1.626 m), weight 346 lb 12.5 oz (157.3 kg), SpO2 100.00%.  General: Well developed,morbidly obese,  well nourished, in no acute distress Head: Eyes PERRLA, No xanthomas.   Normal cephalic and atramatic  Lungs: Clear bilaterally to auscultation and percussion; decreased breath sounds Heart: HRIR without MRG.  Pulses are 2+ & equal.            No carotid bruit. Moderate JVD with head of bed at 45;  No abdominal bruits. No femoral bruits. Abdomen: Bowel sounds are positive, abdomen soft and non-tender without masses or                  Hernia's noted. Msk:  Back normal, normal gait. Normal strength and tone for age. Extremities: No clubbing, cyanosis or edema.  DP +1 Neuro: Alert and oriented X 3. Psych:  Good affect, responds appropriately   Lab Results  Component Value Date   WBC 7.1 07/31/2011   HGB 12.6 07/31/2011   HCT 38.1 07/31/2011   MCV 98.7 07/31/2011   PLT 203 07/31/2011     Lab  07/31/11 0245 07/30/11 1130  NA 137 --  K 3.6 --  CL 102 --  CO2 26 --  BUN 10 --  CREATININE 0.55 --  CALCIUM 8.7 --  PROT -- 7.6  BILITOT -- 1.0  ALKPHOS -- 110  ALT -- 30  AST -- 21  GLUCOSE 123* --   Lab Results  Component Value Date   CKTOTAL 49 07/31/2011   CKMB 1.8 07/31/2011   TROPONINI <0.30 07/31/2011    Radiology: Dg Chest Portable 1 View  07/30/2011  *RADIOLOGY REPORT*  Clinical Data:  Shortness of breath.  Persistent atrial fibrillation.  PORTABLE CHEST - 1 VIEW  Comparison: Chest x-ray 07/18/2009.  Findings: Cardiopericardial silhouette is mildly enlarged (unchanged).  There is cephalization of the pulmonary vasculature and indistinctness of the interstitial markings with some Kerley B lines in the periphery of the lungs, most compatible with mild interstitial pulmonary edema.  No definite pleural effusions. Mediastinal contours are otherwise unremarkable.  IMPRESSION: 1.  Findings, as above, most suggestive of mild congestive heart failure.  Original Report Authenticated By: Florencia Reasons, M.D.   QQV:ZDGLOV fib with RVR, rate 160 bpm (admission)  ASSESSMENT AND PLAN:   1. Atrial fib with RVR: Rate much better controlled on diltiazem gtt at 20 mg.hr with BP tolerating. She has been intolerant to multiple antiarrhythmics and failed DCCV in the past. She has not been tested for sleep apnea, and would likely be having episodes of this with her body habitus, which could exacerbate atrial fib rate increases. She will need to have adjustments in her diltiazem doses at home, and consider adding digoxin as I do not see that it has been tried in the past.    2. Diastolic CHF: She has diuresed well since admission. Wt loss of 4 lbs per records. Breathing some better. Echo is pending, She will need to avoid salt at home which has been discussed with her at length.   3. Morbid obesity: Review of notes demonstrate that their has been some conversation about Bariatric surgery per  Dr. Juanetta Gosling. This  should be revisited.  Bettey Mare. Lyman Bishop NP [facility] 07/31/2011, 9:05 AM  Cardiology Attending Patient interviewed and examined. Discussed with Joni Reining, NP.  Above note annotated and modified based upon my findings.  Chane has long-standing atrial fibrillation with good control of heart rate at rest but questionable control with activity. Lifestyle has become much more sedentary since the onset of her arrhythmia resulting in a 40 pound weight gain over the last 2 years. Prior to that, she lost 100 pounds.  The current hospitalization current medication by loss of control of heart rate in atrial fibrillation resulting in congestive heart failure or the onset of congestive heart failure resulting in epinephrine release and a secondary increase in heart rate. Although this is her first episode of congestive heart failure, I suspect that that is her primary problem, probably as a result of diastolic dysfunction. An echocardiogram will be obtained. An increase in her dose of diuretic is appropriate. She will be transitioned back to oral rate control medication and hopefully can be discharged tomorrow.  Remington Bing, MD 07/31/2011, 1:00 PM

## 2011-07-31 NOTE — Progress Notes (Signed)
ANTICOAGULATION CONSULT NOTE -  Follow-Up Consult  Pharmacy Consult for Warfarin Indication: atrial fibrillation  No Known Allergies  Patient Measurements: Height: 5\' 4"  (162.6 cm) Weight: 346 lb 12.5 oz (157.3 kg) IBW/kg (Calculated) : 54.7    Vital Signs: Temp: 97.6 F (36.4 C) (04/30 0700) Temp src: Oral (04/30 0700) BP: 87/61 mmHg (04/30 1300) Pulse Rate: 83  (04/30 1300)  Labs:  Basename 07/31/11 0245 07/31/11 0230 07/31/11 0229 07/30/11 1905 07/30/11 1130 07/30/11 1058  HGB 12.6 -- -- -- 12.9 --  HCT 38.1 -- -- -- 40.1 --  PLT 203 -- -- -- 219 --  APTT -- -- -- -- -- --  LABPROT -- 25.6* -- -- 26.0* --  INR -- 2.29* -- -- 2.34* 2.9  HEPARINUNFRC -- -- -- -- -- --  CREATININE 0.55 -- -- -- 0.65 --  CKTOTAL -- -- 49 56 57 --  CKMB -- -- 1.8 1.6 1.8 --  TROPONINI -- -- <0.30 <0.30 <0.30 --   Estimated Creatinine Clearance: 115.8 ml/min (by C-G formula based on Cr of 0.55).  Medical History: Past Medical History  Diagnosis Date  . Persistent atrial fibrillation   . Obesity, morbid   . HTN (hypertension)   . DJD (degenerative joint disease)   . Rheumatic fever     denies  h/o  . Arthritis   . Insomnia   . Depression   . Bilateral knee pain     Medications:  Prescriptions prior to admission  Medication Sig Dispense Refill  . ALPRAZolam (XANAX) 1 MG tablet Take 1 mg by mouth as needed. Anxiety      . aspirin (ASPIRIN LOW DOSE) 81 MG EC tablet Take 81 mg by mouth daily.        Marland Kitchen diltiazem (TIAZAC) 240 MG 24 hr capsule Take 240 mg by mouth daily.      . furosemide (LASIX) 40 MG tablet Take 40 mg by mouth every other day.      . lisinopril (PRINIVIL,ZESTRIL) 20 MG tablet Take 20 mg by mouth daily.      Marland Kitchen loperamide (IMODIUM) 2 MG capsule Take 2 mg by mouth 4 (four) times daily as needed. Diarrhea      . metoprolol succinate (TOPROL-XL) 100 MG 24 hr tablet Take 100 mg by mouth daily. Take with or immediately following a meal.      . oxyCODONE-acetaminophen  (PERCOCET) 10-325 MG per tablet Take 1 tablet by mouth every 6 (six) hours.      Marland Kitchen warfarin (COUMADIN) 5 MG tablet Take 5-7.5 mg by mouth daily. Takes 5 mg on Mon, Wed, and Fri. All other days patient takes 7.5 mg.        Assessment: 56 yo F on chronic warfarin. INR therapeutic on admission. No bleeding noted.    Goal of Therapy:  INR 2-3   Plan:  1) Continue home warfarin dose - 5mg  on MWF; 7.5mg  on TTSS 2) Daily PT/INR.  Stanly Si, [place] [place], PHARMD 07/31/2011,1:47 PM

## 2011-07-31 NOTE — Progress Notes (Signed)
Utilization review completed.  

## 2011-07-31 NOTE — Progress Notes (Addendum)
Subjective: She says she feels better. She has no new complaints. Her atrial fibrillation shows a slower ventricular response. She is less short of breath  Objective: Vital signs in last 24 hours: Temp:  [97.5 F (36.4 C)-98.1 F (36.7 C)] 97.6 F (36.4 C) (04/30 0700) Pulse Rate:  [44-144] 75  (04/30 0600) Resp:  [14-30] 24  (04/30 0600) BP: (86-172)/(52-120) 147/88 mmHg (04/30 0600) SpO2:  [92 %-100 %] 99 % (04/30 0600) FiO2 (%):  [99 %] 99 % (04/29 1235) Weight:  [149.687 kg (330 lb)-157.3 kg (346 lb 12.5 oz)] 157.3 kg (346 lb 12.5 oz) (04/30 0500) Weight change:  Last BM Date: 07/30/11  Intake/Output from previous day: 04/29 0701 - 04/30 0700 In: 976.3 [P.O.:240; I.V.:728.3; IV Piggyback:8] Out: 4350 [Urine:4350]  PHYSICAL EXAM General appearance: alert, cooperative and morbidly obese Resp: clear to auscultation bilaterally Cardio: irregularly irregular rhythm GI: soft, non-tender; bowel sounds normal; no masses,  no organomegaly Extremities: extremities normal, atraumatic, no cyanosis or edema  Lab Results:    Basic Metabolic Panel:  Basename 07/31/11 0245 07/30/11 1130  NA 137 139  K 3.6 3.6  CL 102 101  CO2 26 27  GLUCOSE 123* 135*  BUN 10 12  CREATININE 0.55 0.65  CALCIUM 8.7 8.9  MG -- --  PHOS -- --   Liver Function Tests:  Basename 07/30/11 1130  AST 21  ALT 30  ALKPHOS 110  BILITOT 1.0  PROT 7.6  ALBUMIN 3.7   No results found for this basename: LIPASE:2,AMYLASE:2 in the last 72 hours No results found for this basename: AMMONIA:2 in the last 72 hours CBC:  Basename 07/31/11 0245 07/30/11 1130  WBC 7.1 6.8  NEUTROABS -- 4.0  HGB 12.6 12.9  HCT 38.1 40.1  MCV 98.7 98.0  PLT 203 219   Cardiac Enzymes:  Basename 07/31/11 0229 07/30/11 1905 07/30/11 1130  CKTOTAL 49 56 57  CKMB 1.8 1.6 1.8  CKMBINDEX -- -- --  TROPONINI <0.30 <0.30 <0.30   BNP:  Basename 07/30/11 1157  PROBNP 519.7*   D-Dimer:  Alvira Philips 07/30/11 1130  DDIMER  0.22   CBG: No results found for this basename: GLUCAP:6 in the last 72 hours Hemoglobin A1C: No results found for this basename: HGBA1C in the last 72 hours Fasting Lipid Panel: No results found for this basename: CHOL,HDL,LDLCALC,TRIG,CHOLHDL,LDLDIRECT in the last 72 hours Thyroid Function Tests:  [facility] 07/30/11 1905  TSH 4.390  T4TOTAL --  FREET4 --  T3FREE --  THYROIDAB --   Anemia Panel: No results found for this basename: VITAMINB12,FOLATE,FERRITIN,TIBC,IRON,RETICCTPCT in the last 72 hours Coagulation:  Basename 07/31/11 0230 07/30/11 1130  LABPROT 25.6* 26.0*  INR 2.29* 2.34*   Urine Drug Screen: Drugs of Abuse  No results found for this basename: labopia, cocainscrnur, labbenz, amphetmu, thcu, labbarb    Alcohol Level: No results found for this basename: ETH:2 in the last 72 hours Urinalysis: No results found for this basename: COLORURINE:2,APPERANCEUR:2,LABSPEC:2,PHURINE:2,GLUCOSEU:2,HGBUR:2,BILIRUBINUR:2,KETONESUR:2,PROTEINUR:2,UROBILINOGEN:2,NITRITE:2,LEUKOCYTESUR:2 in the last 72 hours Misc. Labs:  ABGS No results found for this basename: PHART,PCO2,PO2ART,TCO2,HCO3 in the last 72 hours CULTURES Recent Results (from the past 240 hour(s))  MRSA PCR SCREENING     Status: Abnormal   Collection Time   07/30/11  2:25 PM      Component Value Range Status Comment   MRSA by PCR POSITIVE (*) NEGATIVE  Final    Studies/Results: Dg Chest Portable 1 View  07/30/2011  *RADIOLOGY REPORT*  Clinical Data: Shortness of breath.  Persistent atrial fibrillation.  PORTABLE  CHEST - 1 VIEW  Comparison: Chest x-ray 07/18/2009.  Findings: Cardiopericardial silhouette is mildly enlarged (unchanged).  There is cephalization of the pulmonary vasculature and indistinctness of the interstitial markings with some Kerley B lines in the periphery of the lungs, most compatible with mild interstitial pulmonary edema.  No definite pleural effusions. Mediastinal contours are otherwise  unremarkable.  IMPRESSION: 1.  Findings, as above, most suggestive of mild congestive heart failure.  Original Report Authenticated By: Florencia Reasons, M.D.    Medications:  Scheduled:   . sodium chloride   Intravenous STAT  . aspirin  81 mg Oral Daily  . Chlorhexidine Gluconate Cloth  6 each Topical Q0600  . diltiazem (CARDIZEM) infusion  5-15 mg/hr Intravenous Once  . diltiazem  20 mg Intravenous Once  . furosemide  40 mg Intravenous Q12H  . lisinopril  20 mg Oral Daily  . metoprolol succinate  100 mg Oral Daily  . mupirocin ointment  1 application Nasal BID  . sodium chloride  3 mL Intravenous Q12H  . sodium chloride      . warfarin  5 mg Oral Once  . Warfarin - Pharmacist Dosing Inpatient   Does not apply q1800  . DISCONTD: diltiazem  10 mg Intravenous Once  . DISCONTD: diltiazem  10 mg Intravenous Once   Continuous:   . diltiazem (CARDIZEM) infusion 5 mg/hr (07/31/11 0700)   VWU:[id number], acetaminophen, ALPRAZolam, ondansetron (ZOFRAN) IV, oxyCODONE, zolpidem  Assesment: She has atrial fibrillation with rapid ventricular response and what appears to be some CHF on chest x-ray. She is much improved. She has a lot of difficulty with even minimal activity so I discussed the possibility of her getting into the cardiac rehabilitation program with her Active Problems:  * No active hospital problems. *     Plan: No change in treatments. She is to have cardiology consultation today. She will have echocardiogram today.    LOS: 1 day   Mckena Chern L 07/31/2011, 8:01 AM

## 2011-08-01 LAB — BASIC METABOLIC PANEL
BUN: 11 mg/dL (ref 6–23)
CO2: 33 mEq/L — ABNORMAL HIGH (ref 19–32)
GFR calc non Af Amer: >90 mL/min (ref >90)
Glucose, Bld: 114 mg/dL — ABNORMAL HIGH (ref 70–99)
Potassium: 3.9 mEq/L (ref 3.5–5.1)

## 2011-08-01 MED ORDER — LIVING BETTER WITH HEART FAILURE BOOK
Freq: Once | Status: DC
  Filled 2011-08-01: qty 1

## 2011-08-01 MED ORDER — FUROSEMIDE 40 MG PO TABS: 40.0000 mg | ORAL_TABLET | Freq: Every day | ORAL | Status: DC

## 2011-08-01 MED ORDER — DILTIAZEM HCL ER COATED BEADS 360 MG PO CP24: 360.0000 mg | ORAL_CAPSULE | Freq: Every day | ORAL | Status: DC

## 2011-08-01 NOTE — Discharge Summary (Signed)
Physician Discharge Summary  Patient ID: Jasmin Martin MRN: [id number] DOB/AGE: Jun 19, 1955 56 y.o. Primary Care Physician:Antonisha Waskey L, MD, MD Admit date: 07/30/2011 Discharge date: 08/01/2011    Discharge Diagnoses:   Active Problems:  OBESITY-MORBID (>100')  Hypertension  FIBRILLATION, ATRIAL  Diastolic CHF, chronic   Medication List  As of 08/01/2011  8:04 AM   STOP taking these medications         diltiazem 240 MG 24 hr capsule         TAKE these medications         ALPRAZolam 1 MG tablet   Commonly known as: XANAX   Take 1 mg by mouth as needed. Anxiety      ASPIRIN LOW DOSE 81 MG EC tablet   Generic drug: aspirin   Take 81 mg by mouth daily.      diltiazem 360 MG 24 hr capsule   Commonly known as: CARDIZEM CD   Take 1 capsule (360 mg total) by mouth daily.      furosemide 40 MG tablet   Commonly known as: LASIX   Take 1 tablet (40 mg total) by mouth daily.      lisinopril 20 MG tablet   Commonly known as: PRINIVIL,ZESTRIL   Take 20 mg by mouth daily.      loperamide 2 MG capsule   Commonly known as: IMODIUM   Take 2 mg by mouth 4 (four) times daily as needed. Diarrhea      metoprolol succinate 100 MG 24 hr tablet   Commonly known as: TOPROL-XL   Take 100 mg by mouth daily. Take with or immediately following a meal.      oxyCODONE-acetaminophen 10-325 MG per tablet   Commonly known as: PERCOCET   Take 1 tablet by mouth every 6 (six) hours.      warfarin 5 MG tablet   Commonly known as: COUMADIN   Take 5-7.5 mg by mouth daily. Takes 5 mg on Mon, Wed, and Fri. All other days patient takes 7.5 mg.            Discharged Condition: Improved    Consults: Cardiology  Significant Diagnostic Studies: Dg Chest Portable 1 View  07/30/2011  *RADIOLOGY REPORT*  Clinical Data: Shortness of breath.  Persistent atrial fibrillation.  PORTABLE CHEST - 1 VIEW  Comparison: Chest x-ray 07/18/2009.  Findings: Cardiopericardial silhouette is mildly enlarged  (unchanged).  There is cephalization of the pulmonary vasculature and indistinctness of the interstitial markings with some Kerley B lines in the periphery of the lungs, most compatible with mild interstitial pulmonary edema.  No definite pleural effusions. Mediastinal contours are otherwise unremarkable.  IMPRESSION: 1.  Findings, as above, most suggestive of mild congestive heart failure.  Original Report Authenticated By: Florencia Reasons, M.D.    Lab Results: Basic Metabolic Panel:  Basename 08/01/11 0535 07/31/11 0245  NA 138 137  K 3.9 3.6  CL 98 102  CO2 33* 26  GLUCOSE 114* 123*  BUN 11 10  CREATININE 0.63 0.55  CALCIUM 9.1 8.7  MG -- --  PHOS -- --   Liver Function Tests:  Basename 07/30/11 1130  AST 21  ALT 30  ALKPHOS 110  BILITOT 1.0  PROT 7.6  ALBUMIN 3.7     CBC:  Basename 07/31/11 0245 07/30/11 1130  WBC 7.1 6.8  NEUTROABS -- 4.0  HGB 12.6 12.9  HCT 38.1 40.1  MCV 98.7 98.0  PLT 203 219    Recent Results (from the past  240 hour(s))  MRSA PCR SCREENING     Status: Abnormal   Collection Time   07/30/11  2:25 PM      Component Value Range Status Comment   MRSA by PCR POSITIVE (*) NEGATIVE  Final      Hospital Course: She came to the hospital with atrial fibrillation and rapid ventricular response. She had gone to the Coumadin clinic and was found to have a heart rate of about 130 and she appeared very short of breath. She was admitted to the step down unit and started on a Cardizem intravenous drip and given Lasix. She improved fairly rapidly. Cardiology consultation was obtained it was felt that she had atrial fibrillation with rapid ventricular response and diastolic congestive heart failure. She was treated as noted above. Her diltiazem dose was increased and her heart rate came under control even with oral medications. She diuresed well on increased dose of Lasix.  Discharge Exam: Blood pressure 128/83, pulse 54, temperature 97.4 F (36.3 C),  temperature source Oral, resp. rate 18, height 5\' 4"  (1.626 m), weight 157 kg (346 lb 2 oz), SpO2 100.00%. She was awake and alert. She is morbidly obese. Her chest is clear. She is in atrial fibrillation with a heart rate of about 60-80  Disposition: Home with home health services. She will be on Lasix daily instead of every other day, she will have her diltiazem increased to 360 mg daily. Because she has so much difficulty with exercise I am going to get her into the cardiac rehabilitation program. She had a sleep study some years ago that was borderline but since that time she has gained approximately 60-70 pounds and I think she may have more problems with sleep apnea social need to have another sleep study.  Discharge Orders    Future Appointments: Provider: Department: Dept Phone: Center:   08/29/2011 11:10 AM [phone] [facility]     Future Orders Please Complete By Expires   Home Health      Questions: Responses:   To provide the following care/treatments RN   Face-to-face encounter      Comments:   I Avalon Coppinger L certify that this patient is under my care and that I, or a nurse practitioner or physician's assistant working with me, had a face-to-face encounter that meets the physician face-to-face encounter requirements with this patient on 08/01/2011.       Questions: Responses:   The encounter with the patient was in whole, or in part, for the following medical condition, which is the primary reason for home health care chf   I certify that, based on my findings, the following services are medically necessary home health services Nursing   My clinical findings support the need for the above services Acute exacerbation of CHF   Further, I certify that my clinical findings support that this patient is homebound (i.e. absences from home require considerable and taxing effort and are for medical reasons or religious services or  infrequently or of short duration when for other reasons) Ambulates short distances less than 300 feet   To provide the following care/treatments RN   Discharge patient           Signed: Fredirick Maudlin Pager [phone]  08/01/2011, 8:04 AM

## 2011-08-01 NOTE — Progress Notes (Signed)
Pt given discharge instructions. Pt given follow up appointments with INR clinic and Dr. Juanetta Gosling. Pt given discharge instructions. Pt given heart failure education. No scale available to send home with pt. Pt states she will go buy a scale. Pt alert, oriented and in stable condition at the time of discharge.

## 2011-08-01 NOTE — Progress Notes (Signed)
   CARE MANAGEMENT NOTE 08/01/2011  Patient:  Jasmin Martin, Jasmin Martin   Account Number:  [id number]  Date Initiated:  08/01/2011  Documentation initiated by:  Sharrie Rothman  Subjective/Objective Assessment:   Pt admitted from home with CHF and a fib. Pt lives at home with children. Pt moderately independent with ADL's PTA.     Action/Plan:   Martin spoke with pt about HH needs at discharge.   Anticipated DC Date:  08/06/2011   Anticipated DC Plan:  HOME W HOME HEALTH SERVICES      DC Planning Services  Martin consult      [facility] Choice  HOME HEALTH   Choice offered to / List presented to:  C-1 Patient        HH arranged  HH-1 Jasmin      [facility] agency  [facility]   Status of service:  Completed, signed off Medicare Important Message given?   (If response is "NO", the following Medicare IM given date fields will be blank) Date Medicare IM given:   Date Additional Medicare IM given:    Discharge Disposition:  HOME W HOME HEALTH SERVICES  Per UR Regulation:    If discussed at Long Length of Stay Meetings, dates discussed:    Comments:  08/01/11 1051 Jasmin Martin, Jasmin Martin Pt discharged home today with [facility] for Jasmin. Jasmin Martin of [facility] is aware and will collect the pts information from the chart. No DME needs noted at this time. Pt and pts nurse aware of pts discharge arrangements.

## 2011-08-01 NOTE — Progress Notes (Signed)
ANTICOAGULATION CONSULT NOTE -  Follow-Up Consult  Pharmacy Consult for Warfarin Indication: atrial fibrillation  No Known Allergies  Patient Measurements: Height: 5\' 4"  (162.6 cm) Weight: 346 lb 2 oz (157 kg) IBW/kg (Calculated) : 54.7    Vital Signs: Temp: 97.5 F (36.4 C) (05/01 0800) Temp src: Oral (05/01 0800) BP: 80/15 mmHg (05/01 0815) Pulse Rate: 78  (05/01 0900)  Labs:  Basename 08/01/11 0535 07/31/11 0245 07/31/11 0230 07/31/11 0229 07/30/11 1905 07/30/11 1130  HGB -- 12.6 -- -- -- 12.9  HCT -- 38.1 -- -- -- 40.1  PLT -- 203 -- -- -- 219  APTT -- -- -- -- -- --  LABPROT 29.0* -- 25.6* -- -- 26.0*  INR 2.69* -- 2.29* -- -- 2.34*  HEPARINUNFRC -- -- -- -- -- --  CREATININE 0.63 0.55 -- -- -- 0.65  CKTOTAL -- -- -- 49 56 57  CKMB -- -- -- 1.8 1.6 1.8  TROPONINI -- -- -- <0.30 <0.30 <0.30   Estimated Creatinine Clearance: 115.7 ml/min (by C-G formula based on Cr of 0.63).  Medical History: Past Medical History  Diagnosis Date  . Persistent atrial fibrillation   . Obesity, morbid   . HTN (hypertension)   . DJD (degenerative joint disease)   . Rheumatic fever     denies  h/o  . Arthritis   . Insomnia   . Depression   . Bilateral knee pain     Medications:  Prescriptions prior to admission  Medication Sig Dispense Refill  . ALPRAZolam (XANAX) 1 MG tablet Take 1 mg by mouth as needed. Anxiety      . aspirin (ASPIRIN LOW DOSE) 81 MG EC tablet Take 81 mg by mouth daily.        Marland Kitchen lisinopril (PRINIVIL,ZESTRIL) 20 MG tablet Take 20 mg by mouth daily.      Marland Kitchen loperamide (IMODIUM) 2 MG capsule Take 2 mg by mouth 4 (four) times daily as needed. Diarrhea      . metoprolol succinate (TOPROL-XL) 100 MG 24 hr tablet Take 100 mg by mouth daily. Take with or immediately following a meal.      . oxyCODONE-acetaminophen (PERCOCET) 10-325 MG per tablet Take 1 tablet by mouth every 6 (six) hours.      Marland Kitchen warfarin (COUMADIN) 5 MG tablet Take 5-7.5 mg by mouth daily. Takes 5  mg on Mon, Wed, and Fri. All other days patient takes 7.5 mg.      . DISCONTD: diltiazem (TIAZAC) 240 MG 24 hr capsule Take 240 mg by mouth daily.      Marland Kitchen DISCONTD: furosemide (LASIX) 40 MG tablet Take 40 mg by mouth every other day.        Assessment: 56 yo F on chronic warfarin. INR therapeutic on admission. No bleeding noted.    Goal of Therapy:  INR 2-3   Plan:  1) Continue home warfarin dose - 5mg  on MWF; 7.5mg  on TTSS 2) Daily PT/INR.  Wayland Denis, [place] 08/01/2011,11:06 AM

## 2011-08-01 NOTE — Progress Notes (Signed)
Subjective: She feels better and has no new complaints. Her heart rate is much better controlled. Per Dr. Marvel Plan note last night he feels she can probably be discharged today and I think that is appropriate. She is not short of breath. Her echocardiogram showed essentially normal left ventricular systolic function  Objective: Vital signs in last 24 hours: Temp:  [97.3 F (36.3 C)-98.1 F (36.7 C)] 97.4 F (36.3 C) (05/01 0400) Pulse Rate:  [44-117] 54  (05/01 0650) Resp:  [13-21] 18  (05/01 0650) BP: (78-134)/(48-105) 128/83 mmHg (05/01 0500) SpO2:  [92 %-100 %] 100 % (05/01 0650) Weight:  [157 kg (346 lb 2 oz)] 157 kg (346 lb 2 oz) (05/01 0500) Weight change: 7.313 kg (16 lb 2 oz) Last BM Date: 07/30/11  Intake/Output from previous day: 04/30 0701 - 05/01 0700 In: 1352 [P.O.:1320; I.V.:28; IV Piggyback:4] Out: 1800 [Urine:1800]  PHYSICAL EXAM General appearance: alert, cooperative and morbidly obese Resp: clear to auscultation bilaterally Cardio: irregularly irregular rhythm GI: soft, non-tender; bowel sounds normal; no masses,  no organomegaly Extremities: extremities normal, atraumatic, no cyanosis or edema  Lab Results:    Basic Metabolic Panel:  Basename 08/01/11 0535 07/31/11 0245  NA 138 137  K 3.9 3.6  CL 98 102  CO2 33* 26  GLUCOSE 114* 123*  BUN 11 10  CREATININE 0.63 0.55  CALCIUM 9.1 8.7  MG -- --  PHOS -- --   Liver Function Tests:  Basename 07/30/11 1130  AST 21  ALT 30  ALKPHOS 110  BILITOT 1.0  PROT 7.6  ALBUMIN 3.7   No results found for this basename: LIPASE:2,AMYLASE:2 in the last 72 hours No results found for this basename: AMMONIA:2 in the last 72 hours CBC:  Basename 07/31/11 0245 07/30/11 1130  WBC 7.1 6.8  NEUTROABS -- 4.0  HGB 12.6 12.9  HCT 38.1 40.1  MCV 98.7 98.0  PLT 203 219   Cardiac Enzymes:  Basename 07/31/11 0229 07/30/11 1905 07/30/11 1130  CKTOTAL 49 56 57  CKMB 1.8 1.6 1.8  CKMBINDEX -- -- --  TROPONINI  <0.30 <0.30 <0.30   BNP:  Basename 07/30/11 1157  PROBNP 519.7*   D-Dimer:  Basename 07/30/11 1130  DDIMER 0.22   CBG: No results found for this basename: GLUCAP:6 in the last 72 hours Hemoglobin A1C: No results found for this basename: HGBA1C in the last 72 hours Fasting Lipid Panel: No results found for this basename: CHOL,HDL,LDLCALC,TRIG,CHOLHDL,LDLDIRECT in the last 72 hours Thyroid Function Tests:  [facility] 07/30/11 1905  TSH 4.390  T4TOTAL --  FREET4 --  T3FREE --  THYROIDAB --   Anemia Panel: No results found for this basename: VITAMINB12,FOLATE,FERRITIN,TIBC,IRON,RETICCTPCT in the last 72 hours Coagulation:  Basename 08/01/11 0535 07/31/11 0230  LABPROT 29.0* 25.6*  INR 2.69* 2.29*   Urine Drug Screen: Drugs of Abuse  No results found for this basename: labopia, cocainscrnur, labbenz, amphetmu, thcu, labbarb    Alcohol Level: No results found for this basename: ETH:2 in the last 72 hours Urinalysis: No results found for this basename: COLORURINE:2,APPERANCEUR:2,LABSPEC:2,PHURINE:2,GLUCOSEU:2,HGBUR:2,BILIRUBINUR:2,KETONESUR:2,PROTEINUR:2,UROBILINOGEN:2,NITRITE:2,LEUKOCYTESUR:2 in the last 72 hours Misc. Labs:  ABGS No results found for this basename: PHART,PCO2,PO2ART,TCO2,HCO3 in the last 72 hours CULTURES Recent Results (from the past 240 hour(s))  MRSA PCR SCREENING     Status: Abnormal   Collection Time   07/30/11  2:25 PM      Component Value Range Status Comment   MRSA by PCR POSITIVE (*) NEGATIVE  Final    Studies/Results: Dg Chest Portable  1 View  07/30/2011  *RADIOLOGY REPORT*  Clinical Data: Shortness of breath.  Persistent atrial fibrillation.  PORTABLE CHEST - 1 VIEW  Comparison: Chest x-ray 07/18/2009.  Findings: Cardiopericardial silhouette is mildly enlarged (unchanged).  There is cephalization of the pulmonary vasculature and indistinctness of the interstitial markings with some Kerley B lines in the periphery of the lungs, most  compatible with mild interstitial pulmonary edema.  No definite pleural effusions. Mediastinal contours are otherwise unremarkable.  IMPRESSION: 1.  Findings, as above, most suggestive of mild congestive heart failure.  Original Report Authenticated By: Florencia Reasons, M.D.    Medications:  Prior to Admission:  Prescriptions prior to admission  Medication Sig Dispense Refill  . ALPRAZolam (XANAX) 1 MG tablet Take 1 mg by mouth as needed. Anxiety      . aspirin (ASPIRIN LOW DOSE) 81 MG EC tablet Take 81 mg by mouth daily.        Marland Kitchen lisinopril (PRINIVIL,ZESTRIL) 20 MG tablet Take 20 mg by mouth daily.      Marland Kitchen loperamide (IMODIUM) 2 MG capsule Take 2 mg by mouth 4 (four) times daily as needed. Diarrhea      . metoprolol succinate (TOPROL-XL) 100 MG 24 hr tablet Take 100 mg by mouth daily. Take with or immediately following a meal.      . oxyCODONE-acetaminophen (PERCOCET) 10-325 MG per tablet Take 1 tablet by mouth every 6 (six) hours.      Marland Kitchen warfarin (COUMADIN) 5 MG tablet Take 5-7.5 mg by mouth daily. Takes 5 mg on Mon, Wed, and Fri. All other days patient takes 7.5 mg.      . DISCONTD: diltiazem (TIAZAC) 240 MG 24 hr capsule Take 240 mg by mouth daily.      Marland Kitchen DISCONTD: furosemide (LASIX) 40 MG tablet Take 40 mg by mouth every other day.       Scheduled:   . a stronger pump book   Does not apply Once  . aspirin  81 mg Oral Daily  . Chlorhexidine Gluconate Cloth  6 each Topical Q0600  . diltiazem  90 mg Oral QID  . furosemide  40 mg Intravenous Q12H  . lisinopril  20 mg Oral Daily  . metoprolol succinate  100 mg Oral Daily  . mupirocin ointment  1 application Nasal BID  . sodium chloride  3 mL Intravenous Q12H  . warfarin  5 mg Oral Q M,W,F-1800  . warfarin  7.5 mg Oral Custom  . Warfarin - Pharmacist Dosing Inpatient   Does not apply q1800   Continuous:   . DISCONTD: diltiazem (CARDIZEM) infusion 5 mg/hr (07/31/11 1200)   YQM:VHQIONGEXBMWU, acetaminophen, ALPRAZolam, oxyCODONE,  zolpidem  Assesment: She was admitted with atrial fibrillation with rapid response. She appeared to have diastolic congestive heart failure as well. She has hypertension. She has no overt signs of heart failure now. Her atrial fibrillation is much better controlled. I think she's ready for discharge home Active Problems:  OBESITY-MORBID (>100')  Hypertension  FIBRILLATION, ATRIAL  Diastolic CHF, chronic    Plan: Discharge home    LOS: 2 days   Mckinley Olheiser L 08/01/2011, 8:01 AM

## 2011-08-28 ENCOUNTER — Other Ambulatory Visit: Payer: Self-pay | Admitting: Cardiology

## 2011-08-29 ENCOUNTER — Ambulatory Visit (INDEPENDENT_AMBULATORY_CARE_PROVIDER_SITE_OTHER): Payer: Medicaid Other | Admitting: *Deleted

## 2011-08-29 DIAGNOSIS — I4891 Unspecified atrial fibrillation: Secondary | ICD-10-CM

## 2011-08-29 DIAGNOSIS — Z7901 Long term (current) use of anticoagulants: Secondary | ICD-10-CM

## 2011-09-05 ENCOUNTER — Other Ambulatory Visit (HOSPITAL_COMMUNITY): Payer: Self-pay | Admitting: Pulmonary Disease

## 2011-09-05 DIAGNOSIS — Z139 Encounter for screening, unspecified: Secondary | ICD-10-CM

## 2011-09-10 ENCOUNTER — Ambulatory Visit (HOSPITAL_COMMUNITY)
Admission: RE | Admit: 2011-09-10 | Discharge: 2011-09-10 | Disposition: A | Discharge status: Home / Self Care | Payer: Medicaid Other | Source: Ambulatory Visit | Attending: Pulmonary Disease | Admitting: Pulmonary Disease

## 2011-09-10 DIAGNOSIS — Z139 Encounter for screening, unspecified: Secondary | ICD-10-CM

## 2011-09-10 DIAGNOSIS — Z1231 Encounter for screening mammogram for malignant neoplasm of breast: Secondary | ICD-10-CM | POA: Insufficient documentation

## 2011-09-12 ENCOUNTER — Ambulatory Visit (INDEPENDENT_AMBULATORY_CARE_PROVIDER_SITE_OTHER): Payer: Medicaid Other | Admitting: *Deleted

## 2011-09-12 DIAGNOSIS — I4891 Unspecified atrial fibrillation: Secondary | ICD-10-CM

## 2011-09-12 DIAGNOSIS — Z7901 Long term (current) use of anticoagulants: Secondary | ICD-10-CM

## 2011-09-13 ENCOUNTER — Encounter (HOSPITAL_COMMUNITY): Payer: Self-pay

## 2011-09-13 ENCOUNTER — Encounter (HOSPITAL_COMMUNITY)
Admission: RE | Admit: 2011-09-13 | Discharge: 2011-09-13 | Disposition: A | Discharge status: Home / Self Care | Payer: Medicaid Other | Source: Ambulatory Visit | Attending: Pulmonary Disease | Admitting: Pulmonary Disease

## 2011-09-13 VITALS — BP 112/80 | HR 80 | Ht 64.0 in | Wt 337.0 lb

## 2011-09-13 DIAGNOSIS — Z5189 Encounter for other specified aftercare: Secondary | ICD-10-CM | POA: Insufficient documentation

## 2011-09-13 DIAGNOSIS — I5032 Chronic diastolic (congestive) heart failure: Secondary | ICD-10-CM | POA: Insufficient documentation

## 2011-09-13 DIAGNOSIS — G8929 Other chronic pain: Secondary | ICD-10-CM | POA: Insufficient documentation

## 2011-09-13 DIAGNOSIS — I4891 Unspecified atrial fibrillation: Secondary | ICD-10-CM

## 2011-09-13 DIAGNOSIS — M545 Low back pain, unspecified: Secondary | ICD-10-CM | POA: Insufficient documentation

## 2011-09-13 DIAGNOSIS — E039 Hypothyroidism, unspecified: Secondary | ICD-10-CM | POA: Insufficient documentation

## 2011-09-13 DIAGNOSIS — J4489 Other specified chronic obstructive pulmonary disease: Secondary | ICD-10-CM | POA: Insufficient documentation

## 2011-09-13 DIAGNOSIS — I509 Heart failure, unspecified: Secondary | ICD-10-CM

## 2011-09-13 DIAGNOSIS — I10 Essential (primary) hypertension: Secondary | ICD-10-CM | POA: Insufficient documentation

## 2011-09-13 DIAGNOSIS — J449 Chronic obstructive pulmonary disease, unspecified: Secondary | ICD-10-CM | POA: Insufficient documentation

## 2011-09-13 NOTE — Progress Notes (Signed)
Patient was referred by [name] for Atrial fibulation and CHF. Pt has finished orientation and is scheduled to start CR on 09/17/11 at 9:30 am. Pt. has been instructed to arrive to class 15 minutes early for scheduled class. Pt has been instructed to wear comfortable clothing and shoes with rubber soles. Pt has been told to take their medications 1 hour prior to coming to class.  If the patient is not going to attend class, he/she has been instructed to call.

## 2011-09-13 NOTE — Patient Instructions (Signed)
Pt has finished orientation and is scheduled to start CR on 09/17/11 at 9:30 am. Pt has been instructed to arrive to class 15 minutes early for scheduled class. Pt has been instructed to wear comfortable clothing and shoes with rubber soles. Pt has been told to take their medications 1 hour prior to coming to class.  If the patient is not going to attend class, he/she has been instructed to call.

## 2011-09-14 ENCOUNTER — Other Ambulatory Visit: Payer: Self-pay | Admitting: Pulmonary Disease

## 2011-09-14 DIAGNOSIS — R928 Other abnormal and inconclusive findings on diagnostic imaging of breast: Secondary | ICD-10-CM

## 2011-09-17 ENCOUNTER — Encounter (HOSPITAL_COMMUNITY)
Admission: RE | Admit: 2011-09-17 | Discharge: 2011-09-17 | Disposition: A | Discharge status: Home / Self Care | Payer: Medicaid Other | Source: Ambulatory Visit | Attending: Cardiology | Admitting: Cardiology

## 2011-09-19 ENCOUNTER — Encounter (HOSPITAL_COMMUNITY)
Admission: RE | Admit: 2011-09-19 | Discharge: 2011-09-19 | Disposition: A | Discharge status: Home / Self Care | Payer: Medicaid Other | Source: Ambulatory Visit | Attending: Cardiology | Admitting: Cardiology

## 2011-09-21 ENCOUNTER — Encounter (HOSPITAL_COMMUNITY)
Admission: RE | Admit: 2011-09-21 | Discharge: 2011-09-21 | Disposition: A | Discharge status: Home / Self Care | Payer: Medicaid Other | Source: Ambulatory Visit | Attending: Cardiology | Admitting: Cardiology

## 2011-09-24 ENCOUNTER — Encounter (HOSPITAL_COMMUNITY)
Admission: RE | Admit: 2011-09-24 | Discharge: 2011-09-24 | Disposition: A | Discharge status: Home / Self Care | Payer: Medicaid Other | Source: Ambulatory Visit | Attending: Cardiology | Admitting: Cardiology

## 2011-09-26 ENCOUNTER — Ambulatory Visit (HOSPITAL_COMMUNITY)
Admission: RE | Admit: 2011-09-26 | Discharge: 2011-09-26 | Disposition: A | Discharge status: Home / Self Care | Payer: Medicaid Other | Source: Ambulatory Visit | Attending: Pulmonary Disease | Admitting: Pulmonary Disease

## 2011-09-26 ENCOUNTER — Encounter (HOSPITAL_COMMUNITY): Payer: Medicaid Other

## 2011-09-26 DIAGNOSIS — R928 Other abnormal and inconclusive findings on diagnostic imaging of breast: Secondary | ICD-10-CM | POA: Insufficient documentation

## 2011-09-28 ENCOUNTER — Encounter (HOSPITAL_COMMUNITY)
Admission: RE | Admit: 2011-09-28 | Discharge: 2011-09-28 | Disposition: A | Discharge status: Home / Self Care | Payer: Medicaid Other | Source: Ambulatory Visit | Attending: Cardiology | Admitting: Cardiology

## 2011-10-01 ENCOUNTER — Other Ambulatory Visit: Payer: Self-pay | Admitting: Cardiology

## 2011-10-01 ENCOUNTER — Encounter (HOSPITAL_COMMUNITY)
Admission: RE | Admit: 2011-10-01 | Discharge: 2011-10-01 | Disposition: A | Discharge status: Home / Self Care | Payer: Medicaid Other | Source: Ambulatory Visit | Attending: Cardiology | Admitting: Cardiology

## 2011-10-01 DIAGNOSIS — G8929 Other chronic pain: Secondary | ICD-10-CM | POA: Insufficient documentation

## 2011-10-01 DIAGNOSIS — J4489 Other specified chronic obstructive pulmonary disease: Secondary | ICD-10-CM | POA: Insufficient documentation

## 2011-10-01 DIAGNOSIS — I10 Essential (primary) hypertension: Secondary | ICD-10-CM | POA: Insufficient documentation

## 2011-10-01 DIAGNOSIS — J449 Chronic obstructive pulmonary disease, unspecified: Secondary | ICD-10-CM | POA: Insufficient documentation

## 2011-10-01 DIAGNOSIS — Z5189 Encounter for other specified aftercare: Secondary | ICD-10-CM | POA: Insufficient documentation

## 2011-10-01 DIAGNOSIS — M545 Low back pain, unspecified: Secondary | ICD-10-CM | POA: Insufficient documentation

## 2011-10-01 DIAGNOSIS — E039 Hypothyroidism, unspecified: Secondary | ICD-10-CM | POA: Insufficient documentation

## 2011-10-01 DIAGNOSIS — I4891 Unspecified atrial fibrillation: Secondary | ICD-10-CM | POA: Insufficient documentation

## 2011-10-01 DIAGNOSIS — I509 Heart failure, unspecified: Secondary | ICD-10-CM | POA: Insufficient documentation

## 2011-10-01 DIAGNOSIS — I5032 Chronic diastolic (congestive) heart failure: Secondary | ICD-10-CM | POA: Insufficient documentation

## 2011-10-01 NOTE — Telephone Encounter (Signed)
Pt needs appointment then refill can be made Fax Received. Refill Completed. Jasmin Martin (R.M.A)   

## 2011-10-03 ENCOUNTER — Encounter (HOSPITAL_COMMUNITY)
Admission: RE | Admit: 2011-10-03 | Discharge: 2011-10-03 | Disposition: A | Discharge status: Home / Self Care | Payer: Medicaid Other | Source: Ambulatory Visit | Attending: Cardiology | Admitting: Cardiology

## 2011-10-03 ENCOUNTER — Ambulatory Visit (INDEPENDENT_AMBULATORY_CARE_PROVIDER_SITE_OTHER): Payer: Medicaid Other | Admitting: *Deleted

## 2011-10-03 DIAGNOSIS — Z7901 Long term (current) use of anticoagulants: Secondary | ICD-10-CM

## 2011-10-03 DIAGNOSIS — I4891 Unspecified atrial fibrillation: Secondary | ICD-10-CM

## 2011-10-05 ENCOUNTER — Encounter (HOSPITAL_COMMUNITY): Payer: Medicaid Other

## 2011-10-08 ENCOUNTER — Encounter (HOSPITAL_COMMUNITY): Payer: Medicaid Other

## 2011-10-10 ENCOUNTER — Encounter (HOSPITAL_COMMUNITY): Payer: Medicaid Other

## 2011-10-12 ENCOUNTER — Encounter (HOSPITAL_COMMUNITY): Payer: Medicaid Other

## 2011-10-15 ENCOUNTER — Encounter (HOSPITAL_COMMUNITY): Payer: Medicaid Other

## 2011-10-17 ENCOUNTER — Encounter (HOSPITAL_COMMUNITY): Payer: Medicaid Other

## 2011-10-19 ENCOUNTER — Encounter (HOSPITAL_COMMUNITY): Payer: Medicaid Other

## 2011-10-22 ENCOUNTER — Encounter (HOSPITAL_COMMUNITY): Payer: Medicaid Other

## 2011-10-24 ENCOUNTER — Encounter (HOSPITAL_COMMUNITY): Payer: Medicaid Other

## 2011-10-25 ENCOUNTER — Ambulatory Visit (INDEPENDENT_AMBULATORY_CARE_PROVIDER_SITE_OTHER): Payer: Medicaid Other | Admitting: *Deleted

## 2011-10-25 DIAGNOSIS — I4891 Unspecified atrial fibrillation: Secondary | ICD-10-CM

## 2011-10-25 DIAGNOSIS — Z7901 Long term (current) use of anticoagulants: Secondary | ICD-10-CM

## 2011-10-25 LAB — POCT INR: INR: 2.1

## 2011-10-26 ENCOUNTER — Encounter (HOSPITAL_COMMUNITY): Payer: Medicaid Other

## 2011-10-27 ENCOUNTER — Other Ambulatory Visit: Payer: Self-pay | Admitting: Cardiology

## 2011-10-29 ENCOUNTER — Other Ambulatory Visit: Payer: Self-pay | Admitting: Cardiology

## 2011-10-29 ENCOUNTER — Encounter (HOSPITAL_COMMUNITY): Payer: Medicaid Other

## 2011-10-29 MED ORDER — FUROSEMIDE 40 MG PO TABS: 40.0000 mg | ORAL_TABLET | Freq: Every day | ORAL | Status: DC

## 2011-10-31 ENCOUNTER — Encounter (HOSPITAL_COMMUNITY): Payer: Medicaid Other

## 2011-11-02 ENCOUNTER — Encounter (HOSPITAL_COMMUNITY): Payer: Medicaid Other

## 2011-11-05 ENCOUNTER — Encounter (HOSPITAL_COMMUNITY): Payer: Medicaid Other

## 2011-11-07 ENCOUNTER — Encounter (HOSPITAL_COMMUNITY): Payer: Medicaid Other

## 2011-11-09 ENCOUNTER — Encounter (HOSPITAL_COMMUNITY): Payer: Medicaid Other

## 2011-11-12 ENCOUNTER — Encounter (HOSPITAL_COMMUNITY): Payer: Medicaid Other

## 2011-11-13 ENCOUNTER — Ambulatory Visit: Payer: Medicaid Other | Admitting: Cardiology

## 2011-11-14 ENCOUNTER — Encounter (HOSPITAL_COMMUNITY): Payer: Medicaid Other

## 2011-11-16 ENCOUNTER — Encounter (HOSPITAL_COMMUNITY): Payer: Medicaid Other

## 2011-11-19 ENCOUNTER — Encounter (HOSPITAL_COMMUNITY): Payer: Medicaid Other

## 2011-11-21 ENCOUNTER — Encounter (HOSPITAL_COMMUNITY): Payer: Medicaid Other

## 2011-11-22 ENCOUNTER — Ambulatory Visit (INDEPENDENT_AMBULATORY_CARE_PROVIDER_SITE_OTHER): Payer: Medicaid Other | Admitting: *Deleted

## 2011-11-22 DIAGNOSIS — Z7901 Long term (current) use of anticoagulants: Secondary | ICD-10-CM

## 2011-11-22 DIAGNOSIS — I4891 Unspecified atrial fibrillation: Secondary | ICD-10-CM

## 2011-11-22 LAB — POCT INR: INR: 3.2

## 2011-11-23 ENCOUNTER — Encounter (HOSPITAL_COMMUNITY): Payer: Medicaid Other

## 2011-11-26 ENCOUNTER — Encounter (HOSPITAL_COMMUNITY): Payer: Medicaid Other

## 2011-11-28 ENCOUNTER — Encounter (HOSPITAL_COMMUNITY): Payer: Medicaid Other

## 2011-11-30 ENCOUNTER — Encounter (HOSPITAL_COMMUNITY): Payer: Medicaid Other

## 2011-12-03 ENCOUNTER — Encounter (HOSPITAL_COMMUNITY): Payer: Medicaid Other

## 2011-12-05 ENCOUNTER — Other Ambulatory Visit: Payer: Self-pay | Admitting: Cardiology

## 2011-12-05 ENCOUNTER — Encounter (HOSPITAL_COMMUNITY): Payer: Medicaid Other

## 2011-12-06 ENCOUNTER — Telehealth: Payer: Self-pay | Admitting: Cardiology

## 2011-12-06 NOTE — Telephone Encounter (Signed)
Error/tg °

## 2011-12-07 ENCOUNTER — Encounter (HOSPITAL_COMMUNITY): Payer: Medicaid Other

## 2011-12-10 ENCOUNTER — Encounter (HOSPITAL_COMMUNITY): Payer: Medicaid Other

## 2011-12-12 ENCOUNTER — Encounter (HOSPITAL_COMMUNITY): Payer: Medicaid Other

## 2011-12-14 ENCOUNTER — Encounter (HOSPITAL_COMMUNITY): Payer: Medicaid Other

## 2011-12-17 ENCOUNTER — Encounter (HOSPITAL_COMMUNITY): Payer: Medicaid Other

## 2011-12-19 ENCOUNTER — Encounter (HOSPITAL_COMMUNITY): Payer: Medicaid Other

## 2011-12-20 ENCOUNTER — Ambulatory Visit (INDEPENDENT_AMBULATORY_CARE_PROVIDER_SITE_OTHER): Payer: Medicaid Other | Admitting: *Deleted

## 2011-12-20 DIAGNOSIS — I4891 Unspecified atrial fibrillation: Secondary | ICD-10-CM

## 2011-12-20 DIAGNOSIS — Z7901 Long term (current) use of anticoagulants: Secondary | ICD-10-CM

## 2011-12-20 LAB — POCT INR: INR: 2.8

## 2011-12-21 ENCOUNTER — Encounter (HOSPITAL_COMMUNITY): Payer: Medicaid Other

## 2011-12-24 ENCOUNTER — Ambulatory Visit (HOSPITAL_COMMUNITY): Payer: Medicaid Other

## 2011-12-26 ENCOUNTER — Ambulatory Visit (HOSPITAL_COMMUNITY): Payer: Medicaid Other

## 2011-12-28 ENCOUNTER — Ambulatory Visit (HOSPITAL_COMMUNITY): Payer: Medicaid Other

## 2011-12-31 ENCOUNTER — Encounter (HOSPITAL_COMMUNITY): Payer: Medicaid Other

## 2012-01-02 ENCOUNTER — Encounter (HOSPITAL_COMMUNITY): Payer: Medicaid Other

## 2012-01-04 ENCOUNTER — Encounter (HOSPITAL_COMMUNITY): Payer: Medicaid Other

## 2012-01-07 ENCOUNTER — Other Ambulatory Visit: Payer: Self-pay | Admitting: *Deleted

## 2012-01-07 ENCOUNTER — Other Ambulatory Visit: Payer: Self-pay | Admitting: Cardiology

## 2012-01-07 ENCOUNTER — Encounter (HOSPITAL_COMMUNITY): Payer: Medicaid Other

## 2012-01-07 MED ORDER — METOPROLOL SUCCINATE ER 100 MG PO TB24: ORAL_TABLET | ORAL | Status: DC

## 2012-01-07 NOTE — Telephone Encounter (Signed)
Refilled Toprol.

## 2012-01-17 ENCOUNTER — Ambulatory Visit (INDEPENDENT_AMBULATORY_CARE_PROVIDER_SITE_OTHER): Payer: Medicaid Other | Admitting: *Deleted

## 2012-01-17 DIAGNOSIS — Z7901 Long term (current) use of anticoagulants: Secondary | ICD-10-CM

## 2012-01-17 DIAGNOSIS — I4891 Unspecified atrial fibrillation: Secondary | ICD-10-CM

## 2012-01-17 LAB — POCT INR: INR: 2.7

## 2012-02-12 ENCOUNTER — Encounter: Payer: Self-pay | Admitting: Cardiology

## 2012-02-12 ENCOUNTER — Ambulatory Visit (INDEPENDENT_AMBULATORY_CARE_PROVIDER_SITE_OTHER): Payer: Medicaid Other | Admitting: Cardiology

## 2012-02-12 VITALS — BP 128/82 | HR 89 | Ht 64.0 in | Wt 354.0 lb

## 2012-02-12 DIAGNOSIS — Z7901 Long term (current) use of anticoagulants: Secondary | ICD-10-CM

## 2012-02-12 DIAGNOSIS — I4891 Unspecified atrial fibrillation: Secondary | ICD-10-CM

## 2012-02-12 DIAGNOSIS — I509 Heart failure, unspecified: Secondary | ICD-10-CM

## 2012-02-12 DIAGNOSIS — I10 Essential (primary) hypertension: Secondary | ICD-10-CM

## 2012-02-12 DIAGNOSIS — I5032 Chronic diastolic (congestive) heart failure: Secondary | ICD-10-CM

## 2012-02-12 NOTE — Assessment & Plan Note (Signed)
Under good control. She does have some chronic diastolic dysfunction secondary to her weight. She has biatrial enlargement which may be a reflection of her obesity. I've encouraged her to lose weight.

## 2012-02-12 NOTE — Patient Instructions (Addendum)
Your physician recommends that you schedule a follow-up appointment in: 1 year  
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
## 2012-02-12 NOTE — Assessment & Plan Note (Signed)
Stable with good rate control and anticoagulation. No change in treatment. Return the office in one year.

## 2012-02-14 ENCOUNTER — Ambulatory Visit (INDEPENDENT_AMBULATORY_CARE_PROVIDER_SITE_OTHER): Payer: Medicaid Other | Admitting: *Deleted

## 2012-02-14 DIAGNOSIS — Z7901 Long term (current) use of anticoagulants: Secondary | ICD-10-CM

## 2012-02-14 DIAGNOSIS — I4891 Unspecified atrial fibrillation: Secondary | ICD-10-CM

## 2012-02-14 LAB — POCT INR: INR: 2.1

## 2012-04-07 ENCOUNTER — Ambulatory Visit (INDEPENDENT_AMBULATORY_CARE_PROVIDER_SITE_OTHER): Payer: Medicaid Other | Admitting: *Deleted

## 2012-04-07 DIAGNOSIS — Z7901 Long term (current) use of anticoagulants: Secondary | ICD-10-CM

## 2012-04-07 DIAGNOSIS — I4891 Unspecified atrial fibrillation: Secondary | ICD-10-CM

## 2012-04-07 LAB — POCT INR: INR: 1.9

## 2012-04-16 ENCOUNTER — Other Ambulatory Visit: Payer: Self-pay | Admitting: Cardiology

## 2012-04-16 NOTE — Telephone Encounter (Signed)
rx sent to pharmacy by e-script  

## 2012-04-25 NOTE — Progress Notes (Signed)
Cardiac Rehabilitation Program Outcomes Report   Orientation:  09/13/2011 1st week report: 09/21/2011 Graduate Date:  tbd Discharge Date:  tbd # of sessions completed: 3  Cardiologist: Casmalia Bing Family MD:  Benedetto Goad Time:  09:30  A.  Exercise Program:  Tolerates exercise @ 3.83 METS for 15 minutes and Walk Test Results:  Pre: 6 minute walk test. Resting HR 80, BP 122/80, O2 96%, RPE 6, and RPD 7. 6 minute HR 128, BP 150/90, O2 96%, RPE 11, and RPD 13,, Post HR  81,  BP 118/80 , O2 98 RPE 9 and RPD 8. Walked 61ft at 1,39mph   B.  Mental Health:  Good mental attitude  C.  Education/Instruction/Skills  Accurately checks own pulse.  Rest:  83  Exercise: 113, Knows THR for exercise and Uses Perceived Exertion Scale and/or Dyspnea Scale  Uses Perceived Exertion Scale and/or Dyspnea Scale  D.  Nutrition/Weight Control/Body Composition:  Adherence to prescribed nutrition program: good    E.  Blood Lipids    No results found for this basename: CHOL, HDL, LDLCALC, LDLDIRECT, TRIG, CHOLHDL    F.  Lifestyle Changes:  Making positive lifestyle changes  G.  Symptoms noted with exercise:  Asymptomatic  Report Completed By:  Lelon Huh. Calie Buttrey RN   Comments:  This is patients 1st week report. She achieved a peak METS 65f 3.83. Her resting HR is 83 and Resting BP was 120/80, and Peak BP was 113 and peak BP was 160/80 . A report will follow on patients 18 th visit,her halfway point.

## 2012-05-05 ENCOUNTER — Ambulatory Visit (INDEPENDENT_AMBULATORY_CARE_PROVIDER_SITE_OTHER): Payer: Medicaid Other | Admitting: *Deleted

## 2012-05-05 DIAGNOSIS — I4891 Unspecified atrial fibrillation: Secondary | ICD-10-CM

## 2012-05-05 DIAGNOSIS — Z7901 Long term (current) use of anticoagulants: Secondary | ICD-10-CM

## 2012-05-10 ENCOUNTER — Ambulatory Visit (HOSPITAL_COMMUNITY)
Admission: RE | Admit: 2012-05-10 | Discharge: 2012-05-10 | Disposition: A | Discharge status: Home / Self Care | Payer: Medicaid Other | Source: Ambulatory Visit | Attending: Pulmonary Disease | Admitting: Pulmonary Disease

## 2012-05-10 ENCOUNTER — Other Ambulatory Visit (HOSPITAL_COMMUNITY): Payer: Self-pay | Admitting: Pulmonary Disease

## 2012-05-10 DIAGNOSIS — M25561 Pain in right knee: Secondary | ICD-10-CM

## 2012-05-10 DIAGNOSIS — M25569 Pain in unspecified knee: Secondary | ICD-10-CM | POA: Insufficient documentation

## 2012-05-21 ENCOUNTER — Ambulatory Visit (INDEPENDENT_AMBULATORY_CARE_PROVIDER_SITE_OTHER): Payer: Medicaid Other | Admitting: *Deleted

## 2012-05-21 DIAGNOSIS — Z7901 Long term (current) use of anticoagulants: Secondary | ICD-10-CM

## 2012-05-21 DIAGNOSIS — I4891 Unspecified atrial fibrillation: Secondary | ICD-10-CM

## 2012-05-22 ENCOUNTER — Encounter: Payer: Self-pay | Admitting: Orthopedic Surgery

## 2012-05-22 ENCOUNTER — Ambulatory Visit (INDEPENDENT_AMBULATORY_CARE_PROVIDER_SITE_OTHER): Payer: Medicaid Other | Admitting: Orthopedic Surgery

## 2012-05-22 DIAGNOSIS — M179 Osteoarthritis of knee, unspecified: Secondary | ICD-10-CM | POA: Insufficient documentation

## 2012-05-22 DIAGNOSIS — M1710 Unilateral primary osteoarthritis, unspecified knee: Secondary | ICD-10-CM

## 2012-05-22 DIAGNOSIS — G589 Mononeuropathy, unspecified: Secondary | ICD-10-CM

## 2012-05-22 DIAGNOSIS — G629 Polyneuropathy, unspecified: Secondary | ICD-10-CM | POA: Insufficient documentation

## 2012-05-22 DIAGNOSIS — IMO0002 Reserved for concepts with insufficient information to code with codable children: Secondary | ICD-10-CM

## 2012-05-22 MED ORDER — GABAPENTIN 100 MG PO CAPS: 100.0000 mg | ORAL_CAPSULE | Freq: Three times a day (TID) | ORAL | Status: DC

## 2012-05-22 NOTE — Progress Notes (Signed)
Patient ID: Jasmin Martin, female   DOB: 10/17/1955, 57 y.o.   MRN: [id number]  Chief Complaint  Patient presents with  . Knee Pain    Consult has been requested by [name] for right knee pain x4 weeks    The patient is 57 years old she's had recent exacerbation of her CHF and her atrial fibrillation. She presents with severe right knee pain which she describes as dull throbbing 10 out of 10 and constant. This is associated with numbness and tingling in the back of the leg and giving out of the right knee. She's had a lot of swelling around the knee and distal to it. Her pain is unrelieved by Percocet 10 mg. She denies any injury.  In 2006 had arthroscopy of the right knee with operative findings of a torn posterior horn of the medial meniscus with grade 3 lesion of the medial femoral condyle.  She has obesity.  Her review of systems his reported as weight gain watering of the eyes chest palpitations shortness of breath wheezing diarrhea frequency joint pain swelling stiffness heat muscle pain anxiety depression easy bleeding easy bruising although she is on Coumadin. Cold intolerance. She denies skin changes adverse reaction to food.  She has history of atrial fibrillation degenerative disc disease obesity hypertension congestive heart failure  Past Surgical History  Procedure Laterality Date  . Bilateral knee surgery    . Vesicovaginal fistula closure w/ tah    . Tonsillectomy    . Right ankle surgery    . Cholecystectomy  2001  . Cesarean section    . Abdominal hysterectomy      Her general appearance is normal in terms of grooming and hygiene. He has full normal development but she is obese. Her peripheral vascular system shows normal temperature but edema. Lymph nodes are negative. She ambulates with a significant limp favoring the right lower extremity  Right knee on inspection is tender and swollen she has bursitis as well. Joint lines are tender. Range of motion 95.  Collateral ligaments and cruciate ligaments are stable. Muscle tone and strength are normal. Her skin is warm dry and intact without rash. She is oriented x3 her mood and affect are normal. Gross sensation is normal.  X-ray dated February 8 shows degenerative joint disease of the medial compartment  Report is reviewed and film is reviewed.  Neuropathy - Plan: gabapentin (NEURONTIN) 100 MG capsule  OA (osteoarthritis) of knee   You have received a steroid shot. 15% of patients experience increased pain at the injection site with in the next 24 hours. This is best treated with ice and tylenol extra strength 2 tabs every 8 hours. If you are still having pain please call the office.

## 2012-05-22 NOTE — Patient Instructions (Addendum)
You have received a steroid shot. 15% of patients experience increased pain at the injection site with in the next 24 hours. This is best treated with ice and tylenol extra strength 2 tabs every 8 hours. If you are still having pain please call the office.   Start gabapentin 100 mg tid after 1 week if not improved increase to 2 tabs every 8 hours   Neuropathic Pain We often think that pain has a physical cause. If we get rid of the cause, the pain should go away. However, nerves themselves can also cause pain. This pain often does not go away easily. It is called neuropathic pain, which means nerve abnormality. It may be difficult to understand for the patients who have it and for the treating caregivers. Neuropathic pain may be caused by an injury or failure of the nervous system. The pain often results from an injury, but this injury may or may not be the cause of actual damage to the nervous system. Nerves can be penetrated or squashed by tumors, strangled by scar tissue, or become inflamed due to infection. Neuropathic pain is often caused by nerve injury due to diabetes.Alcohol abuse may also lead to neuropathic pain. Neuropathic pain often seems to have no cause. The diagnosis is made when no other cause is found.  Pain may persist for months or years following the healing of damaged tissues. When this happens, pain signals no longer sound an alarm about current injuries or injuries about to happen. Instead, the alarm system itself is not working correctly.  CHARACTERISTICS OF NEUROPATHIC PAIN  Severe, sharp, electric shock-like, shooting, lightening-like, knife-like.  Pins and needles sensation.  Deep burning, deep cold, or deep ache.  Persistent numbness, tingling, or weakness.  Pain resulting from a light touch or other stimulus that would not usually cause pain.  Increased sensitivity to something that would normally cause pain, such as a pinprick. Neuropathic pain may get worse  instead of better over time. For some people, it can lead to serious disability. It is important to be aware that severe injury in a limb can occur without a proper, protective pain response.Burns, cuts, and other injuries may go unnoticed. Without proper treatment, these injuries can become infected or lead to further disability. Take any injury seriously, and consult your caregiver for treatment. TREATMENT  Neuropathic pain is often long-lasting and tends not to get better when treated with opioids (narcotic types of pain medicine). It may respond well to other drugs such as antiseizure and antidepressant medicines. Usually, neuropathic pain does not completely go away, but partial improvement is often possible with proper treatment. Your caregivers, along with you, will select the medicines which best allow you to live a normal life. Do not be discouraged if you do not get immediate relief. Sometimes different medicines or a combination of medicines will be tried before you receive the benefits you are hoping for. SEEK IMMEDIATE MEDICAL CARE IF:  There is a sudden change in the quality of your pain, especially if the change is on only one side of the body.  You notice changes of the skin, such as redness, black or purple discoloration, swelling, or an ulcer.  You cannot move the affected limbs. Document Released: 11/10/2003 Document Revised: 06/11/2011 Document Reviewed: 04/27/2010 [facility] Patient Information 2013 [place], [place]. Wear and Tear Disorders of the Knee (Arthritis, Osteoarthritis) Everyone will experience wear and tear injuries (arthritis, osteoarthritis) of the knee. These are the changes we all get as we age. They  come from the joint stress of daily living. The amount of cartilage damage in your knee and your symptoms determine if you need surgery. Mild problems require approximately two months recovery time. More severe problems take several months to recover. With mild problems,  your surgeon may find worn and rough cartilage surfaces. With severe changes, your surgeon may find cartilage that has completely worn away and exposed the bone. Loose bodies of bone and cartilage, bone spurs (excess bone growth), and injuries to the menisci (cushions between the large bones of your leg) are also common. All of these problems can cause pain. For a mild wear and tear problem, rough cartilage may simply need to be shaved and smoothed. For more severe problems with areas of exposed bone, your surgeon may use an instrument for roughing up the bone surfaces to stimulate new cartilage growth. Loose bodies are usually removed. Torn menisci may be trimmed or repaired. ABOUT THE ARTHROSCOPIC PROCEDURE Arthroscopy is a surgical technique. It allows your orthopedic surgeon to diagnose and treat your knee injury with accuracy. The surgeon looks into your knee through a small scope. The scope is like a small (pencil-sized) telescope. Arthroscopy is less invasive than open knee surgery. You can expect a more rapid recovery. After the procedure, you will be moved to a recovery area until most of the effects of the medication have worn off. Your caregiver will discuss the test results with you. RECOVERY The severity of the arthritis and the type of procedure performed will determine recovery time. Other important factors include age, physical condition, medical conditions, and the type of rehabilitation program. Strengthening your muscles after arthroscopy helps guarantee a better recovery. Follow your caregiver's instructions. Use crutches, rest, elevate, ice, and do knee exercises as instructed. Your caregivers will help you and instruct you with exercises and other physical therapy required to regain your mobility, muscle strength, and functioning following surgery. Only take over-the-counter or prescription medicines for pain, discomfort, or fever as directed by your caregiver.  SEEK MEDICAL CARE IF:    There is increased bleeding (more than a small spot) from the wound.  You notice redness, swelling, or increasing pain in the wound.  Pus is coming from wound.  You develop an unexplained oral temperature above 102 F (38.9 C) , or as your caregiver suggests.  You notice a foul smell coming from the wound or dressing.  You have severe pain with motion of the knee. SEEK IMMEDIATE MEDICAL CARE IF:   You develop a rash.  You have difficulty breathing.  You have any allergic problems. MAKE SURE YOU:   Understand these instructions.  Will watch your condition.  Will get help right away if you are not doing well or get worse. Document Released: 03/16/2000 Document Revised: 06/11/2011 Document Reviewed: 08/13/2007 [facility] Patient Information 2013 [place], [place].

## 2012-06-04 ENCOUNTER — Ambulatory Visit (INDEPENDENT_AMBULATORY_CARE_PROVIDER_SITE_OTHER): Payer: Medicaid Other | Admitting: *Deleted

## 2012-06-04 DIAGNOSIS — Z7901 Long term (current) use of anticoagulants: Secondary | ICD-10-CM

## 2012-06-04 DIAGNOSIS — I4891 Unspecified atrial fibrillation: Secondary | ICD-10-CM

## 2012-06-17 ENCOUNTER — Other Ambulatory Visit: Payer: Self-pay | Admitting: Cardiology

## 2012-06-25 ENCOUNTER — Ambulatory Visit (INDEPENDENT_AMBULATORY_CARE_PROVIDER_SITE_OTHER): Payer: Medicaid Other | Admitting: *Deleted

## 2012-06-25 DIAGNOSIS — Z7901 Long term (current) use of anticoagulants: Secondary | ICD-10-CM

## 2012-06-25 DIAGNOSIS — I4891 Unspecified atrial fibrillation: Secondary | ICD-10-CM

## 2012-07-28 ENCOUNTER — Ambulatory Visit (INDEPENDENT_AMBULATORY_CARE_PROVIDER_SITE_OTHER): Payer: Medicaid Other | Admitting: *Deleted

## 2012-07-28 DIAGNOSIS — I4891 Unspecified atrial fibrillation: Secondary | ICD-10-CM

## 2012-07-28 DIAGNOSIS — Z7901 Long term (current) use of anticoagulants: Secondary | ICD-10-CM

## 2012-08-26 NOTE — Progress Notes (Signed)
Cardiac Rehabilitation Program Outcomes Report   Orientation:  09/13/2011 Graduate Date:  N/A Discharge Date:  10/03/2011 # of sessions completed: 7 DX: A-Fibb CHF  Cardiologist: [place] Bing Family MD:   Shaune Pollack Class Time:  0930  A.  Exercise Program:  Tolerates exercise @ 3.84 METS for 15 minutes and Discharged  B.  Mental Health:  Good mental attitude  C.  Education/Instruction/Skills  Accurately checks own pulse.  Rest:  88  Exercise: 128, Knows THR for exercise, Uses Perceived Exertion Scale and/or Dyspnea Scale and Attended 2 education classes  Uses Perceived Exertion Scale and/or Dyspnea Scale  D.  Nutrition/Weight Control/Body Composition:  Adherence to prescribed nutrition program: good    E.  Blood Lipids    No results found for this basename: CHOL, HDL, LDLCALC, LDLDIRECT, TRIG, CHOLHDL    F.  Lifestyle Changes:  Making positive lifestyle changes  G.  Symptoms noted with exercise:  Asymptomatic  Report Completed By:  Lelon Huh. Carollee Nussbaumer RN   Comments:  This is patients discharge report , She only attended 7 sessions and then she stopped due to illness. She achieved a peak METS of 3.84. Her resting HR was 88 and resting BP was 120/60. Her peak HR was 128 and peak BP was 128/60. She did well while in cardiac rehab.

## 2012-08-27 ENCOUNTER — Ambulatory Visit (INDEPENDENT_AMBULATORY_CARE_PROVIDER_SITE_OTHER): Payer: Medicaid Other | Admitting: *Deleted

## 2012-08-27 DIAGNOSIS — I4891 Unspecified atrial fibrillation: Secondary | ICD-10-CM

## 2012-08-27 DIAGNOSIS — Z7901 Long term (current) use of anticoagulants: Secondary | ICD-10-CM

## 2012-08-27 LAB — POCT INR: INR: 3.3

## 2012-09-10 ENCOUNTER — Ambulatory Visit (INDEPENDENT_AMBULATORY_CARE_PROVIDER_SITE_OTHER): Payer: Medicaid Other | Admitting: *Deleted

## 2012-09-10 DIAGNOSIS — I4891 Unspecified atrial fibrillation: Secondary | ICD-10-CM

## 2012-09-10 DIAGNOSIS — Z7901 Long term (current) use of anticoagulants: Secondary | ICD-10-CM

## 2012-09-10 LAB — POCT INR: INR: 3.2

## 2012-10-01 ENCOUNTER — Ambulatory Visit (INDEPENDENT_AMBULATORY_CARE_PROVIDER_SITE_OTHER): Payer: Medicaid Other | Admitting: *Deleted

## 2012-10-01 DIAGNOSIS — I4891 Unspecified atrial fibrillation: Secondary | ICD-10-CM

## 2012-10-01 DIAGNOSIS — Z7901 Long term (current) use of anticoagulants: Secondary | ICD-10-CM

## 2012-10-01 LAB — POCT INR: INR: 2.5

## 2012-10-18 ENCOUNTER — Other Ambulatory Visit: Payer: Self-pay | Admitting: Cardiology

## 2012-10-20 NOTE — Telephone Encounter (Signed)
Medication sent via escribe for warfarin 

## 2012-10-29 ENCOUNTER — Ambulatory Visit (INDEPENDENT_AMBULATORY_CARE_PROVIDER_SITE_OTHER): Payer: Medicaid Other | Admitting: *Deleted

## 2012-10-29 DIAGNOSIS — I4891 Unspecified atrial fibrillation: Secondary | ICD-10-CM

## 2012-10-29 DIAGNOSIS — Z7901 Long term (current) use of anticoagulants: Secondary | ICD-10-CM

## 2012-10-29 LAB — POCT INR: INR: 4.6

## 2012-11-10 ENCOUNTER — Ambulatory Visit (INDEPENDENT_AMBULATORY_CARE_PROVIDER_SITE_OTHER): Payer: Medicaid Other | Admitting: *Deleted

## 2012-11-10 DIAGNOSIS — I4891 Unspecified atrial fibrillation: Secondary | ICD-10-CM

## 2012-11-10 DIAGNOSIS — Z7901 Long term (current) use of anticoagulants: Secondary | ICD-10-CM

## 2012-11-10 LAB — POCT INR: INR: 2.8

## 2012-11-17 ENCOUNTER — Other Ambulatory Visit: Payer: Self-pay | Admitting: Cardiology

## 2012-12-08 ENCOUNTER — Ambulatory Visit (INDEPENDENT_AMBULATORY_CARE_PROVIDER_SITE_OTHER): Payer: Medicaid Other | Admitting: *Deleted

## 2012-12-08 DIAGNOSIS — I4891 Unspecified atrial fibrillation: Secondary | ICD-10-CM

## 2012-12-08 DIAGNOSIS — Z7901 Long term (current) use of anticoagulants: Secondary | ICD-10-CM

## 2012-12-08 LAB — POCT INR: INR: 2.7

## 2012-12-18 ENCOUNTER — Ambulatory Visit (INDEPENDENT_AMBULATORY_CARE_PROVIDER_SITE_OTHER): Payer: Medicaid Other | Admitting: Otolaryngology

## 2012-12-18 DIAGNOSIS — R42 Dizziness and giddiness: Secondary | ICD-10-CM

## 2012-12-18 DIAGNOSIS — H8110 Benign paroxysmal vertigo, unspecified ear: Secondary | ICD-10-CM

## 2012-12-18 DIAGNOSIS — H903 Sensorineural hearing loss, bilateral: Secondary | ICD-10-CM

## 2012-12-20 ENCOUNTER — Other Ambulatory Visit: Payer: Self-pay | Admitting: Cardiology

## 2012-12-22 ENCOUNTER — Other Ambulatory Visit: Payer: Self-pay

## 2012-12-24 ENCOUNTER — Encounter: Payer: Self-pay | Admitting: Cardiology

## 2013-01-05 ENCOUNTER — Ambulatory Visit (INDEPENDENT_AMBULATORY_CARE_PROVIDER_SITE_OTHER): Payer: Medicaid Other | Admitting: *Deleted

## 2013-01-05 DIAGNOSIS — I4891 Unspecified atrial fibrillation: Secondary | ICD-10-CM

## 2013-01-05 DIAGNOSIS — Z7901 Long term (current) use of anticoagulants: Secondary | ICD-10-CM

## 2013-01-15 ENCOUNTER — Ambulatory Visit (INDEPENDENT_AMBULATORY_CARE_PROVIDER_SITE_OTHER): Payer: Medicaid Other | Admitting: Otolaryngology

## 2013-01-23 ENCOUNTER — Telehealth: Payer: Self-pay | Admitting: Cardiology

## 2013-01-23 MED ORDER — METOPROLOL SUCCINATE ER 100 MG PO TB24: 100.0000 mg | ORAL_TABLET | Freq: Every day | ORAL | Status: DC

## 2013-01-23 NOTE — Telephone Encounter (Signed)
Received fax refill request  Rx # [id number] Medication:  Toprol SL 100mg  tablet SA Qty 45 Sig:  Take 1 tablet by mouth each morning and 1/2 tablet in the evening Physician:  Daleen Squibb

## 2013-01-23 NOTE — Telephone Encounter (Signed)
rx sent to pharmacy by e-script Only sent in 30 day supply per pt has f/u for one year 01-2013

## 2013-02-06 ENCOUNTER — Telehealth: Payer: Self-pay

## 2013-02-06 ENCOUNTER — Encounter: Payer: Self-pay | Admitting: Cardiology

## 2013-02-06 ENCOUNTER — Ambulatory Visit (INDEPENDENT_AMBULATORY_CARE_PROVIDER_SITE_OTHER): Payer: Medicaid Other | Admitting: Cardiology

## 2013-02-06 VITALS — BP 128/68 | HR 90 | Resp 24 | Ht 63.0 in | Wt 360.0 lb

## 2013-02-06 DIAGNOSIS — R0989 Other specified symptoms and signs involving the circulatory and respiratory systems: Secondary | ICD-10-CM

## 2013-02-06 DIAGNOSIS — I10 Essential (primary) hypertension: Secondary | ICD-10-CM

## 2013-02-06 DIAGNOSIS — I4891 Unspecified atrial fibrillation: Secondary | ICD-10-CM

## 2013-02-06 DIAGNOSIS — R0609 Other forms of dyspnea: Secondary | ICD-10-CM

## 2013-02-06 DIAGNOSIS — R0600 Dyspnea, unspecified: Secondary | ICD-10-CM

## 2013-02-06 MED ORDER — TORSEMIDE 20 MG PO TABS: 20.0000 mg | ORAL_TABLET | Freq: Once | ORAL | Status: DC

## 2013-02-06 NOTE — Progress Notes (Signed)
Clinical Summary [name] is a 57 y.o.female former patient of Dr Daleen Squibb, seen today for follow up of the following medical problems.   1. Afib - rare palpitaitons - on coumadin, followed here in coumadin in clinic. Denies any bleeding issues - compliant with medications, she is on toprol, dilt and coumadin   2. HTN - checks bp at home once a week. Typically 130s/70s - compliant with meds  3. Dyspnea - chronic, reports somewhat worst over the last few months - noted some night time hypoxia, reports followed by Dr Juanetta Gosling however these notes are not available - chronic 3 pillow orthopnea unchanged, + LE edema  Past Medical History  Diagnosis Date  . Persistent atrial fibrillation   . Obesity, morbid   . HTN (hypertension)   . DJD (degenerative joint disease)   . Rheumatic fever     denies  h/o  . Arthritis   . Insomnia   . Depression   . Bilateral knee pain      No Known Allergies   Current Outpatient Prescriptions  Medication Sig Dispense Refill  . ALPRAZolam (XANAX) 1 MG tablet Take 1 mg by mouth as needed. Anxiety      . aspirin (ASPIRIN LOW DOSE) 81 MG EC tablet Take 81 mg by mouth daily.        . furosemide (LASIX) 40 MG tablet TAKE ONE TABLET DAILY.  30 tablet  2  . gabapentin (NEURONTIN) 100 MG capsule Take 1 capsule (100 mg total) by mouth 3 (three) times daily.  90 capsule  1  . lisinopril (PRINIVIL,ZESTRIL) 20 MG tablet TAKE ONE TABLET DAILY.  30 tablet  5  . loperamide (IMODIUM) 2 MG capsule Take 2 mg by mouth 4 (four) times daily as needed. Diarrhea      . metoprolol succinate (TOPROL-XL) 100 MG 24 hr tablet Take 1 tablet (100 mg total) by mouth daily. Take with or immediately following a meal.  45 tablet  0  . oxyCODONE-acetaminophen (PERCOCET) 10-325 MG per tablet Take 1 tablet by mouth every 6 (six) hours.      Marland Kitchen warfarin (COUMADIN) 5 MG tablet TAKE 7.5MG  DAILY EXCEPT MON., WED., AND FRI. TAKE 5MG .  45 tablet  3  . diltiazem (CARDIZEM CD) 360 MG 24  hr capsule Take 1 capsule (360 mg total) by mouth daily.  30 capsule  12   No current facility-administered medications for this visit.     Past Surgical History  Procedure Laterality Date  . Bilateral knee surgery    . Vesicovaginal fistula closure w/ tah    . Tonsillectomy    . Right ankle surgery    . Cholecystectomy  2001  . Cesarean section    . Abdominal hysterectomy       No Known Allergies    Family History  Problem Relation Age of Onset  . Cancer Father     Lung      Social History [name] reports that she has never smoked. She has never used smokeless tobacco. [name] reports that she does not drink alcohol.   Review of Systems CONSTITUTIONAL: No weight loss, fever, chills, weakness or fatigue.  HEENT: Eyes: No visual loss, blurred vision, double vision or yellow sclerae.No hearing loss, sneezing, congestion, runny nose or sore throat.  SKIN: No rash or itching.  CARDIOVASCULAR: per HPI RESPIRATORY: per HPI GASTROINTESTINAL: No anorexia, nausea, vomiting or diarrhea. No abdominal pain or blood.  GENITOURINARY: No burning on urination, no polyuria  NEUROLOGICAL: No headache, dizziness, syncope, paralysis, ataxia, numbness or tingling in the extremities. No change in bowel or bladder control.  MUSCULOSKELETAL: No muscle, back pain, joint pain or stiffness.  LYMPHATICS: No enlarged nodes. No history of splenectomy.  PSYCHIATRIC: No history of depression or anxiety.  ENDOCRINOLOGIC: No reports of sweating, cold or heat intolerance. No polyuria or polydipsia.  Marland Kitchen   Physical Examination Filed Vitals:   02/06/13 1147  BP: 128/68  Pulse: 90  Resp: 24   Filed Weights   02/06/13 1147  Weight: 360 lb (163.295 kg)    Gen: resting comfortably, no acute distress HEENT: no scleral icterus, pupils equal round and reactive, no palptable cervical adenopathy,  CV: RRR, no m/r/g, on JVD, no carotid bruits Resp: Clear to auscultation bilaterally GI: abdomen  is soft, non-tender, non-distended, normal bowel sounds, no hepatosplenomegaly MSK: extremities are warm, 2+ bilateral LE edema.  Skin: warm, no rash Neuro:  no focal deficits Psych: appropriate affect   Diagnostic Studies 07/14/11 Echo: LVEF 50-55%, mild LVH, mod LAE   02/06/13 Clinic EKG: afib rate 90, RAD   Assessment and Plan  1. Afib - no current symptoms - continue current medications  2. HTN - at goal, continue current meds  3. Dyspnea - not described in echo, suspect component of diastolic dysfunction given LVH and mode left atrial enlargement. - she has evidence of volume overload, reports decreased response to lasix - change to torsemide 20mg  daily, counseled if extra swelling or SOB she can take twice a day on those days.       Antoine Poche, M.D., F.A.C.C.

## 2013-02-06 NOTE — Patient Instructions (Addendum)
Your physician recommends that you schedule a follow-up appointment in: one month  Your physician has recommended you make the following change in your medication:   STOP Lasix START Torsemide 20 mg by mouth daily

## 2013-02-09 NOTE — Telephone Encounter (Signed)
Enter in error

## 2013-02-11 ENCOUNTER — Ambulatory Visit: Payer: Medicaid Other | Admitting: Cardiology

## 2013-02-16 ENCOUNTER — Ambulatory Visit (INDEPENDENT_AMBULATORY_CARE_PROVIDER_SITE_OTHER): Payer: Medicaid Other | Admitting: *Deleted

## 2013-02-16 DIAGNOSIS — Z7901 Long term (current) use of anticoagulants: Secondary | ICD-10-CM

## 2013-02-16 DIAGNOSIS — I4891 Unspecified atrial fibrillation: Secondary | ICD-10-CM

## 2013-03-20 ENCOUNTER — Ambulatory Visit: Payer: Medicaid Other | Admitting: Cardiology

## 2013-03-24 ENCOUNTER — Other Ambulatory Visit: Payer: Self-pay | Admitting: Cardiology

## 2013-03-30 ENCOUNTER — Encounter: Payer: Self-pay | Admitting: Adult Health

## 2013-03-30 ENCOUNTER — Ambulatory Visit (INDEPENDENT_AMBULATORY_CARE_PROVIDER_SITE_OTHER): Payer: Medicaid Other | Admitting: Adult Health

## 2013-03-30 ENCOUNTER — Ambulatory Visit (INDEPENDENT_AMBULATORY_CARE_PROVIDER_SITE_OTHER): Payer: Medicaid Other | Admitting: *Deleted

## 2013-03-30 VITALS — BP 129/75 | HR 93 | Ht 63.0 in | Wt 342.0 lb

## 2013-03-30 DIAGNOSIS — I4891 Unspecified atrial fibrillation: Secondary | ICD-10-CM

## 2013-03-30 DIAGNOSIS — I5032 Chronic diastolic (congestive) heart failure: Secondary | ICD-10-CM

## 2013-03-30 DIAGNOSIS — Z7901 Long term (current) use of anticoagulants: Secondary | ICD-10-CM

## 2013-03-30 DIAGNOSIS — I509 Heart failure, unspecified: Secondary | ICD-10-CM

## 2013-03-30 DIAGNOSIS — I10 Essential (primary) hypertension: Secondary | ICD-10-CM

## 2013-03-30 NOTE — Assessment & Plan Note (Signed)
Rate is well controlled on current medications. She is to have coumadin check today. BP is stable. I will not make any changes at this time. She is doing well. To see Dr. Wyline Mood in 3 months.

## 2013-03-30 NOTE — Progress Notes (Signed)
Name: Jasmin Martin    DOB: [birth year]  Age: 57 y.o.  MR#: [id number]       PCP:  Fredirick Maudlin, MD      Insurance: Payor: MEDICAID [place] / Plan: MEDICAID [place] ACCESS / Product Type: *No Product type* /   CC:    Chief Complaint  Patient presents with  . Atrial Fibrillation  . Hypertension    VS Filed Vitals:   03/30/13 1258  BP: 129/75  Pulse: 93  Height: 5\' 3"  (1.6 m)  Weight: 342 lb (155.13 kg)    Weights Current Weight  03/30/13 342 lb (155.13 kg)  02/06/13 360 lb (163.295 kg)  02/12/12 354 lb (160.573 kg)    Blood Pressure  BP Readings from Last 3 Encounters:  03/30/13 129/75  02/06/13 128/68  02/12/12 128/82     Admit date:  (Not on file) Last encounter with RMR:  Visit date not found   Allergy Review of patient's allergies indicates no known allergies.  Current Outpatient Prescriptions  Medication Sig Dispense Refill  . ALPRAZolam (XANAX) 1 MG tablet Take 1 mg by mouth as needed. Anxiety      . aspirin (ASPIRIN LOW DOSE) 81 MG EC tablet Take 81 mg by mouth daily.        Marland Kitchen lisinopril (PRINIVIL,ZESTRIL) 20 MG tablet TAKE ONE TABLET DAILY.  30 tablet  5  . loperamide (IMODIUM) 2 MG capsule Take 2 mg by mouth 4 (four) times daily as needed. Diarrhea      . oxyCODONE-acetaminophen (PERCOCET) 10-325 MG per tablet Take 1 tablet by mouth every 6 (six) hours.      . TOPROL XL 100 MG 24 hr tablet TAKE (1) TABLET BY MOUTH EACH MORNING AND 1/2 TABLET IN THE EVENING.  45 tablet  6  . torsemide (DEMADEX) 20 MG tablet Take 1 tablet (20 mg total) by mouth once.  30 tablet  3  . warfarin (COUMADIN) 5 MG tablet TAKE 7.5MG  DAILY EXCEPT MON., WED., AND FRI. TAKE 5MG .  45 tablet  3  . diltiazem (CARDIZEM CD) 360 MG 24 hr capsule Take 1 capsule (360 mg total) by mouth daily.  30 capsule  12   No current facility-administered medications for this visit.    Discontinued Meds:    Medications Discontinued During This Encounter  Medication Reason  . gabapentin (NEURONTIN) 100 MG  capsule Error    Patient Active Problem List   Diagnosis Date Noted  . OA (osteoarthritis) of knee 05/22/2012  . Neuropathy 05/22/2012  . Diastolic CHF, chronic 07/31/2011  . Weakness of both legs 05/22/2011  . Long term current use of anticoagulant 06/30/2010  . TENOSYNOVITIS OF FOOT AND ANKLE 03/01/2010  . STRESS FRACTURE, FOOT 03/01/2010  . MUSCLE CRAMPS 10/18/2009  . OBESITY-MORBID (>100') 08/12/2009  . Anxiety state, unspecified 07/18/2009  . DYSPNEA 07/18/2009  . PALPITATIONS 06/15/2009  . Hypertension 06/02/2009  . HYPOKALEMIA 05/31/2009  . FIBRILLATION, ATRIAL 05/31/2009  . PNEUMONIA 05/31/2009  . INFLUENZA 05/31/2009    LABS    Component Value Date/Time   NA 138 08/01/2011 0535   NA 137 07/31/2011 0245   NA 139 07/30/2011 1130   K 3.9 08/01/2011 0535   K 3.6 07/31/2011 0245   K 3.6 07/30/2011 1130   CL 98 08/01/2011 0535   CL 102 07/31/2011 0245   CL 101 07/30/2011 1130   CO2 33* 08/01/2011 0535   CO2 26 07/31/2011 0245   CO2 27 07/30/2011 1130   GLUCOSE 114* 08/01/2011 0535  GLUCOSE 123* 07/31/2011 0245   GLUCOSE 135* 07/30/2011 1130   BUN 11 08/01/2011 0535   BUN 10 07/31/2011 0245   BUN 12 07/30/2011 1130   CREATININE 0.63 08/01/2011 0535   CREATININE 0.55 07/31/2011 0245   CREATININE 0.65 07/30/2011 1130   CALCIUM 9.1 08/01/2011 0535   CALCIUM 8.7 07/31/2011 0245   CALCIUM 8.9 07/30/2011 1130   GFRNONAA >90 08/01/2011 0535   GFRNONAA >90 07/31/2011 0245   GFRNONAA >90 07/30/2011 1130   GFRAA >90 08/01/2011 0535   GFRAA >90 07/31/2011 0245   GFRAA >90 07/30/2011 1130   CMP     Component Value Date/Time   NA 138 08/01/2011 0535   K 3.9 08/01/2011 0535   CL 98 08/01/2011 0535   CO2 33* 08/01/2011 0535   GLUCOSE 114* 08/01/2011 0535   BUN 11 08/01/2011 0535   CREATININE 0.63 08/01/2011 0535   CALCIUM 9.1 08/01/2011 0535   PROT 7.6 07/30/2011 1130   ALBUMIN 3.7 07/30/2011 1130   AST 21 07/30/2011 1130   ALT 30 07/30/2011 1130   ALKPHOS 110 07/30/2011 1130   BILITOT 1.0 07/30/2011 1130    GFRNONAA >90 08/01/2011 0535   GFRAA >90 08/01/2011 0535       Component Value Date/Time   WBC 7.1 07/31/2011 0245   WBC 6.8 07/30/2011 1130   WBC 6.9 10/22/2009 0330   HGB 12.6 07/31/2011 0245   HGB 12.9 07/30/2011 1130   HGB 13.2 10/22/2009 0330   HCT 38.1 07/31/2011 0245   HCT 40.1 07/30/2011 1130   HCT 38.5 10/22/2009 0330   MCV 98.7 07/31/2011 0245   MCV 98.0 07/30/2011 1130   MCV 97.5 10/22/2009 0330    Lipid Panel  No results found for this basename: chol, trig, hdl, cholhdl, vldl, ldlcalc    ABG    Component Value Date/Time   TCO2 28 10/19/2009 1005     Lab Results  Component Value Date   TSH 4.390 07/30/2011   BNP (last 3 results) No results found for this basename: PROBNP,  in the last 8760 hours Cardiac Panel (last 3 results) No results found for this basename: CKTOTAL, CKMB, TROPONINI, RELINDX,  in the last 72 hours  Iron/TIBC/Ferritin No results found for this basename: iron, tibc, ferritin     EKG Orders placed in visit on 02/06/13  . EKG 12-LEAD     Prior Assessment and Plan Problem List as of 03/30/2013   HYPOKALEMIA   OBESITY-MORBID (>100')   Last Assessment & Plan   11/09/2010 Office Visit Written 11/09/2010 10:51 AM by Gaylord Shih, MD     Deteriorated. Encouraged again to lose weight. I made her aware ofher risk of diabetes and other obesity problems.    Anxiety state, unspecified   Hypertension   Last Assessment & Plan   02/12/2012 Office Visit Written 02/12/2012 11:05 AM by Gaylord Shih, MD     Under good control. She does have some chronic diastolic dysfunction secondary to her weight. She has biatrial enlargement which may be a reflection of her obesity. I've encouraged her to lose weight.    FIBRILLATION, ATRIAL   Last Assessment & Plan   02/12/2012 Office Visit Written 02/12/2012 11:04 AM by Gaylord Shih, MD     Stable with good rate control and anticoagulation. No change in treatment. Return the office in one year.    PNEUMONIA   INFLUENZA    TENOSYNOVITIS OF FOOT AND ANKLE   MUSCLE CRAMPS   STRESS FRACTURE, FOOT  PALPITATIONS   DYSPNEA   Long term current use of anticoagulant   Weakness of both legs   Diastolic CHF, chronic   OA (osteoarthritis) of knee   Neuropathy       Imaging: No results found.

## 2013-03-30 NOTE — Assessment & Plan Note (Signed)
She is tolerating and thriving on demedex compared to lasix. Edema has been eliminated. She is watching her diet closely. Labs are being drawn tomorrow for Dr. Juanetta Gosling.

## 2013-03-30 NOTE — Assessment & Plan Note (Signed)
Blood pressure is well controlled. Will continue to monitor it with weight loss. She may need to have medications titrated with decreased weight.

## 2013-03-30 NOTE — Progress Notes (Signed)
HPI: [name] is a 57 year old patient of Dr. Wyline Mood we are following for ongoing assessment and management of atrial fibrillation, on chronic Coumadin followed in [place], hypertension, and dyspnea. 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 She was last seen by Dr. Wyline Mood on 02/06/2013. At that time, the patient had evidence of volume overload, reported decreased response to Lasix. She was changed to torsemide 20 mg daily, counseled fracture or swelling or shortness of breath she was to take an additional Demadex on those days weight on last visit 360 pounds.    She has lost 18 lbs since being seen last. She is dieting avoiding carb and sweets. She also says that she really likes the new demedex dose as this is helping her fluid retention much better.   No Known Allergies  Current Outpatient Prescriptions  Medication Sig Dispense Refill  . ALPRAZolam (XANAX) 1 MG tablet Take 1 mg by mouth as needed. Anxiety      . aspirin (ASPIRIN LOW DOSE) 81 MG EC tablet Take 81 mg by mouth daily.        Marland Kitchen lisinopril (PRINIVIL,ZESTRIL) 20 MG tablet TAKE ONE TABLET DAILY.  30 tablet  5  . loperamide (IMODIUM) 2 MG capsule Take 2 mg by mouth 4 (four) times daily as needed. Diarrhea      . oxyCODONE-acetaminophen (PERCOCET) 10-325 MG per tablet Take 1 tablet by mouth every 6 (six) hours.      . TOPROL XL 100 MG 24 hr tablet TAKE (1) TABLET BY MOUTH EACH MORNING AND 1/2 TABLET IN THE EVENING.  45 tablet  6  . torsemide (DEMADEX) 20 MG tablet Take 1 tablet (20 mg total) by mouth once.  30 tablet  3  . warfarin (COUMADIN) 5 MG tablet TAKE 7.5MG  DAILY EXCEPT MON., WED., AND FRI. TAKE 5MG .  45 tablet  3  . diltiazem (CARDIZEM CD) 360 MG 24 hr capsule Take 1 capsule (360 mg total) by mouth daily.  30 capsule  12   No current facility-administered medications for this visit.    Past Medical History  Diagnosis Date  . Persistent atrial fibrillation   . Obesity, morbid   .  HTN (hypertension)   . DJD (degenerative joint disease)   . Rheumatic fever     denies  h/o  . Arthritis   . Insomnia   . Depression   . Bilateral knee pain     Past Surgical History  Procedure Laterality Date  . Bilateral knee surgery    . Vesicovaginal fistula closure w/ tah    . Tonsillectomy    . Right ankle surgery    . Cholecystectomy  2001  . Cesarean section    . Abdominal hysterectomy      ZOX:WRUEAV of systems complete and found to be negative unless listed above  PHYSICAL EXAM BP 129/75  Pulse 93  Ht 5\' 3"  (1.6 m)  Wt 342 lb (155.13 kg)  BMI 60.60 kg/m2  General: Well developed, well nourished, in no acute distress Head: Eyes PERRLA, No xanthomas.   Normal cephalic and atramatic  Lungs: Clear bilaterally to auscultation and percussion. Heart: HRRR S1 S2, with occasional irregular beat.  without MRG.  Pulses are 2+ & equal.            No carotid bruit. No JVD.  No abdominal bruits. No femoral bruits. Abdomen: Bowel sounds are positive, abdomen soft and non-tender without masses or  Hernia's noted. Msk:  Back normal, normal gait. Normal strength and tone for age. Extremities: No clubbing, cyanosis or edema.  DP +1 Neuro: Alert and oriented X 3. Psych:  Good affect, responds appropriately    ASSESSMENT AND PLAN

## 2013-03-30 NOTE — Patient Instructions (Signed)
Your physician recommends that you schedule a follow-up appointment in: 3 months with Dr Branch You will receive a reminder letter two months in advance reminding you to call and schedule your appointment. If you don't receive this letter, please contact our office.  Your physician recommends that you continue on your current medications as directed. Please refer to the Current Medication list given to you today.   

## 2013-04-27 ENCOUNTER — Ambulatory Visit (INDEPENDENT_AMBULATORY_CARE_PROVIDER_SITE_OTHER): Payer: Medicaid Other | Admitting: *Deleted

## 2013-04-27 DIAGNOSIS — I4891 Unspecified atrial fibrillation: Secondary | ICD-10-CM

## 2013-04-27 DIAGNOSIS — Z7901 Long term (current) use of anticoagulants: Secondary | ICD-10-CM

## 2013-04-27 DIAGNOSIS — Z5181 Encounter for therapeutic drug level monitoring: Secondary | ICD-10-CM

## 2013-04-27 LAB — POCT INR: INR: 3.1

## 2013-04-28 ENCOUNTER — Other Ambulatory Visit: Payer: Self-pay | Admitting: Cardiology

## 2013-05-25 ENCOUNTER — Ambulatory Visit (INDEPENDENT_AMBULATORY_CARE_PROVIDER_SITE_OTHER): Payer: Medicaid Other | Admitting: *Deleted

## 2013-05-25 DIAGNOSIS — Z5181 Encounter for therapeutic drug level monitoring: Secondary | ICD-10-CM

## 2013-05-25 DIAGNOSIS — Z7901 Long term (current) use of anticoagulants: Secondary | ICD-10-CM

## 2013-05-25 DIAGNOSIS — I4891 Unspecified atrial fibrillation: Secondary | ICD-10-CM

## 2013-05-25 LAB — POCT INR: INR: 2.7

## 2013-05-27 ENCOUNTER — Other Ambulatory Visit: Payer: Self-pay | Admitting: Cardiology

## 2013-06-15 ENCOUNTER — Ambulatory Visit (INDEPENDENT_AMBULATORY_CARE_PROVIDER_SITE_OTHER): Payer: Medicaid Other | Admitting: *Deleted

## 2013-06-15 DIAGNOSIS — I4891 Unspecified atrial fibrillation: Secondary | ICD-10-CM

## 2013-06-15 DIAGNOSIS — Z5181 Encounter for therapeutic drug level monitoring: Secondary | ICD-10-CM

## 2013-06-15 DIAGNOSIS — Z7901 Long term (current) use of anticoagulants: Secondary | ICD-10-CM

## 2013-06-15 LAB — POCT INR: INR: 2.4

## 2013-06-20 ENCOUNTER — Other Ambulatory Visit: Payer: Self-pay | Admitting: Cardiology

## 2013-07-22 ENCOUNTER — Ambulatory Visit (INDEPENDENT_AMBULATORY_CARE_PROVIDER_SITE_OTHER): Payer: Medicaid Other | Admitting: Physician Assistant

## 2013-07-22 ENCOUNTER — Ambulatory Visit (INDEPENDENT_AMBULATORY_CARE_PROVIDER_SITE_OTHER): Payer: Medicaid Other | Admitting: *Deleted

## 2013-07-22 ENCOUNTER — Encounter: Payer: Self-pay | Admitting: Physician Assistant

## 2013-07-22 VITALS — BP 129/99 | HR 91 | Ht 64.0 in | Wt 340.0 lb

## 2013-07-22 DIAGNOSIS — I4891 Unspecified atrial fibrillation: Secondary | ICD-10-CM

## 2013-07-22 DIAGNOSIS — I509 Heart failure, unspecified: Secondary | ICD-10-CM

## 2013-07-22 DIAGNOSIS — Z7901 Long term (current) use of anticoagulants: Secondary | ICD-10-CM

## 2013-07-22 DIAGNOSIS — Z5181 Encounter for therapeutic drug level monitoring: Secondary | ICD-10-CM

## 2013-07-22 DIAGNOSIS — I10 Essential (primary) hypertension: Secondary | ICD-10-CM

## 2013-07-22 DIAGNOSIS — I5032 Chronic diastolic (congestive) heart failure: Secondary | ICD-10-CM

## 2013-07-22 LAB — POCT INR: INR: 2.2

## 2013-07-22 NOTE — Assessment & Plan Note (Signed)
INR stable today 

## 2013-07-22 NOTE — Patient Instructions (Signed)
Continue all current medications. Your physician wants you to follow up in: 6 months.  You will receive a reminder letter in the mail one-two months in advance.  If you don't receive a letter, please call our office to schedule the follow up appointment   

## 2013-07-22 NOTE — Assessment & Plan Note (Addendum)
Discussed the importance of weight loss with the patient. She did see a nutritionist last week and is hopeful she can lose some weight. Patient is a binge eater and Jasmin Martin is a tough month for her because her mother was murdered in [place] 2006. She always stresses and eats more during this month.

## 2013-07-22 NOTE — Assessment & Plan Note (Signed)
Blood pressure controlled. 

## 2013-07-22 NOTE — Assessment & Plan Note (Signed)
Rate pretty controlled on diltiazem 360 mg daily. She is also on Coumadin.

## 2013-07-22 NOTE — Assessment & Plan Note (Signed)
No evidence of heart failure. Doing well on torsemide.

## 2013-07-22 NOTE — Progress Notes (Signed)
HPI:  This is a 58 year old patient of Dr. Juanetta Gosling and Dr. Wyline Mood who has history of paroxysmal atrial fibrillation on Coumadin, hypertension, obesity, chronic 3 pillow orthopnea and edema. Dr. Wyline Mood changed her from Lasix to torsemide in November 2014 which improved her edema. Last 2-D echo on 07/14/11 EF was 50-55% with mild LVH and moderate LAE. She comes in today for routine followup. She denies any chest pain, palpitations, dyspnea, dyspnea on exertion, dizziness or presyncope. She was admitted to [facility] in February with pneumonia but has recovered. She saw a nutritionist last week who has guided her in losing weight. She says her heart rate occasionally gets up if she becomes anxious or overexerts herself.  No Known Allergies  Current Outpatient Prescriptions on File Prior to Visit: ALPRAZolam (XANAX) 1 MG tablet, Take 1 mg by mouth as needed. Anxiety, Disp: , Rfl:  aspirin (ASPIRIN LOW DOSE) 81 MG EC tablet, Take 81 mg by mouth daily.  , Disp: , Rfl:  diltiazem (CARDIZEM CD) 360 MG 24 hr capsule, Take 1 capsule (360 mg total) by mouth daily., Disp: 30 capsule, Rfl: 12 lisinopril (PRINIVIL,ZESTRIL) 20 MG tablet, TAKE ONE TABLET DAILY., Disp: 30 tablet, Rfl: 6 loperamide (IMODIUM) 2 MG capsule, Take 2 mg by mouth 4 (four) times daily as needed. Diarrhea, Disp: , Rfl:  oxyCODONE-acetaminophen (PERCOCET) 10-325 MG per tablet, Take 1 tablet by mouth every 6 (six) hours., Disp: , Rfl:  TOPROL XL 100 MG 24 hr tablet, TAKE (1) TABLET BY MOUTH EACH MORNING AND 1/2 TABLET IN THE EVENING., Disp: 45 tablet, Rfl: 6 torsemide (DEMADEX) 20 MG tablet, TAKE ONE TABLET BY MOUTH ONCE DAILY., Disp: 90 tablet, Rfl: 3 warfarin (COUMADIN) 5 MG tablet, TAKE 7.5MG  DAILY EXCEPT MON., WED., AND FRI. TAKE 5MG ., Disp: 45 tablet, Rfl: 3  No current facility-administered medications on file prior to visit.   Past Medical History:   Persistent atrial fibrillation                               Obesity, morbid                                               HTN (hypertension)                                           DJD (degenerative joint disease)                             Rheumatic fever                                                Comment:denies  h/o   Arthritis                                                    Insomnia  Depression                                                   Bilateral knee pain                                         Past Surgical History:   bilateral knee surgery                                        VESICOVAGINAL FISTULA CLOSURE W/ TAH                          TONSILLECTOMY                                                 right ankle surgery                                           CHOLECYSTECTOMY                                  2001         CESAREAN SECTION                                              ABDOMINAL HYSTERECTOMY                                       Review of patient's family history indicates:   Cancer                         Father                     Comment: Lung    Social History   Marital Status: Legally Separated   Spouse Name:                      Years of Education:                 Number of children:             Occupational History   None on file  Social History Main Topics   Smoking Status: Never Smoker                     Smokeless Status: Never Used                       Alcohol Use: No  Drug Use: No             Sexual Activity: Yes                    Birth Control/Protection: Surgical  Other Topics            Concern   None on file  Social History Narrative   None on file    ROS: See history of present illness otherwise negative   PHYSICAL EXAM: Obese, in no acute distress. Neck: No JVD, HJR, Bruit, or thyroid enlargement  Lungs: No tachypnea, clear without wheezing, rales, or rhonchi  Cardiovascular: Irregular irregular, PMI not displaced, heart  sounds distant, no murmurs, gallops, bruit, thrill, or heave.  Abdomen: BS normal. Soft without organomegaly, masses, lesions or tenderness.  Extremities: without cyanosis, clubbing or edema. Good distal pulses bilateral  SKin: Warm, no lesions or rashes   Musculoskeletal: No deformities  Neuro: no focal signs  BP 129/99  Pulse 91  Ht 5\' 4"  (1.626 m)  Wt 340 lb (154.223 kg)  BMI 58.33 kg/m2    EKG; last one in November 2014 showed atrial fibrillation. I did not repeat it today.

## 2013-09-02 ENCOUNTER — Ambulatory Visit (INDEPENDENT_AMBULATORY_CARE_PROVIDER_SITE_OTHER): Payer: Medicaid Other | Admitting: *Deleted

## 2013-09-02 DIAGNOSIS — Z7901 Long term (current) use of anticoagulants: Secondary | ICD-10-CM

## 2013-09-02 DIAGNOSIS — Z5181 Encounter for therapeutic drug level monitoring: Secondary | ICD-10-CM

## 2013-09-02 DIAGNOSIS — I4891 Unspecified atrial fibrillation: Secondary | ICD-10-CM

## 2013-09-02 LAB — POCT INR: INR: 3.1

## 2013-09-30 ENCOUNTER — Ambulatory Visit (INDEPENDENT_AMBULATORY_CARE_PROVIDER_SITE_OTHER): Payer: Medicaid Other | Admitting: *Deleted

## 2013-09-30 DIAGNOSIS — Z7901 Long term (current) use of anticoagulants: Secondary | ICD-10-CM

## 2013-09-30 DIAGNOSIS — I4891 Unspecified atrial fibrillation: Secondary | ICD-10-CM

## 2013-09-30 DIAGNOSIS — Z5181 Encounter for therapeutic drug level monitoring: Secondary | ICD-10-CM

## 2013-09-30 DIAGNOSIS — I482 Chronic atrial fibrillation, unspecified: Secondary | ICD-10-CM

## 2013-09-30 LAB — POCT INR: INR: 1.6

## 2013-10-28 ENCOUNTER — Ambulatory Visit (INDEPENDENT_AMBULATORY_CARE_PROVIDER_SITE_OTHER): Payer: Medicaid Other | Admitting: *Deleted

## 2013-10-28 DIAGNOSIS — Z7901 Long term (current) use of anticoagulants: Secondary | ICD-10-CM

## 2013-10-28 DIAGNOSIS — Z5181 Encounter for therapeutic drug level monitoring: Secondary | ICD-10-CM

## 2013-10-28 DIAGNOSIS — I4891 Unspecified atrial fibrillation: Secondary | ICD-10-CM

## 2013-10-28 LAB — POCT INR: INR: 2.1

## 2013-11-06 ENCOUNTER — Other Ambulatory Visit: Payer: Self-pay | Admitting: Adult Health

## 2013-11-25 ENCOUNTER — Ambulatory Visit (INDEPENDENT_AMBULATORY_CARE_PROVIDER_SITE_OTHER): Payer: Medicaid Other | Admitting: *Deleted

## 2013-11-25 DIAGNOSIS — Z7901 Long term (current) use of anticoagulants: Secondary | ICD-10-CM

## 2013-11-25 DIAGNOSIS — I4891 Unspecified atrial fibrillation: Secondary | ICD-10-CM

## 2013-11-25 DIAGNOSIS — Z5181 Encounter for therapeutic drug level monitoring: Secondary | ICD-10-CM

## 2013-11-25 LAB — POCT INR: INR: 1.7

## 2013-12-16 ENCOUNTER — Encounter: Payer: Self-pay | Admitting: *Deleted

## 2013-12-21 ENCOUNTER — Ambulatory Visit (INDEPENDENT_AMBULATORY_CARE_PROVIDER_SITE_OTHER): Payer: Medicaid Other | Admitting: *Deleted

## 2013-12-21 DIAGNOSIS — Z7901 Long term (current) use of anticoagulants: Secondary | ICD-10-CM

## 2013-12-21 DIAGNOSIS — I4891 Unspecified atrial fibrillation: Secondary | ICD-10-CM

## 2013-12-21 DIAGNOSIS — Z5181 Encounter for therapeutic drug level monitoring: Secondary | ICD-10-CM

## 2013-12-21 LAB — POCT INR: INR: 2.1

## 2014-01-12 ENCOUNTER — Telehealth: Payer: Self-pay | Admitting: Cardiology

## 2014-01-12 MED ORDER — METOPROLOL SUCCINATE ER 100 MG PO TB24: ORAL_TABLET | ORAL | Status: DC

## 2014-01-12 MED ORDER — LISINOPRIL 20 MG PO TABS: ORAL_TABLET | ORAL | Status: DC

## 2014-01-12 NOTE — Telephone Encounter (Signed)
Received fax refill request  Rx # [id number] Medication:  Toprol XL 100 mg tablet SA Qty 45 Sig:  Take one tablet by mouth each morning and one half tablet in the evening Physician:  Wyline MoodBranch Received fax refill request  Rx # [id number] Medication:  Lisinopril 20 mg tablets Qty 30 Sig:  Take one tablet by mouth once daily Physician:  Lyman BishopLawrence

## 2014-01-18 ENCOUNTER — Ambulatory Visit (INDEPENDENT_AMBULATORY_CARE_PROVIDER_SITE_OTHER): Payer: Medicaid Other | Admitting: *Deleted

## 2014-01-18 DIAGNOSIS — Z7901 Long term (current) use of anticoagulants: Secondary | ICD-10-CM

## 2014-01-18 DIAGNOSIS — I4891 Unspecified atrial fibrillation: Secondary | ICD-10-CM

## 2014-01-18 DIAGNOSIS — Z5181 Encounter for therapeutic drug level monitoring: Secondary | ICD-10-CM

## 2014-01-18 LAB — POCT INR: INR: 2.6

## 2014-02-12 ENCOUNTER — Other Ambulatory Visit: Payer: Self-pay | Admitting: Cardiology

## 2014-02-15 ENCOUNTER — Ambulatory Visit (INDEPENDENT_AMBULATORY_CARE_PROVIDER_SITE_OTHER): Payer: Medicaid Other | Admitting: *Deleted

## 2014-02-15 DIAGNOSIS — I4891 Unspecified atrial fibrillation: Secondary | ICD-10-CM

## 2014-02-15 DIAGNOSIS — Z7901 Long term (current) use of anticoagulants: Secondary | ICD-10-CM

## 2014-02-15 DIAGNOSIS — Z5181 Encounter for therapeutic drug level monitoring: Secondary | ICD-10-CM

## 2014-02-15 LAB — POCT INR: INR: 2.7

## 2014-03-11 ENCOUNTER — Other Ambulatory Visit: Payer: Self-pay | Admitting: Cardiology

## 2014-03-29 ENCOUNTER — Ambulatory Visit (INDEPENDENT_AMBULATORY_CARE_PROVIDER_SITE_OTHER): Payer: Medicaid Other | Admitting: *Deleted

## 2014-03-29 DIAGNOSIS — Z5181 Encounter for therapeutic drug level monitoring: Secondary | ICD-10-CM

## 2014-03-29 DIAGNOSIS — Z7901 Long term (current) use of anticoagulants: Secondary | ICD-10-CM

## 2014-03-29 DIAGNOSIS — I4891 Unspecified atrial fibrillation: Secondary | ICD-10-CM

## 2014-03-29 LAB — POCT INR: INR: 2.6

## 2014-04-13 ENCOUNTER — Other Ambulatory Visit: Payer: Self-pay | Admitting: Cardiology

## 2014-04-20 ENCOUNTER — Encounter: Payer: Self-pay | Admitting: Cardiology

## 2014-04-20 ENCOUNTER — Ambulatory Visit (INDEPENDENT_AMBULATORY_CARE_PROVIDER_SITE_OTHER): Payer: Medicaid Other | Admitting: Cardiology

## 2014-04-20 VITALS — BP 96/66 | HR 76 | Ht 64.0 in | Wt 343.0 lb

## 2014-04-20 DIAGNOSIS — I4891 Unspecified atrial fibrillation: Secondary | ICD-10-CM

## 2014-04-20 DIAGNOSIS — I10 Essential (primary) hypertension: Secondary | ICD-10-CM

## 2014-04-20 DIAGNOSIS — R0600 Dyspnea, unspecified: Secondary | ICD-10-CM

## 2014-04-20 MED ORDER — TORSEMIDE 100 MG PO TABS: ORAL_TABLET | ORAL | Status: DC

## 2014-04-20 NOTE — Progress Notes (Signed)
Clinical Summary [name] is a 59 y.o.female seen today for follow up of the following medical problems.   1. Afib - occas palpitations, variable frequency. Overall tolerable.  - on coumadin, followed here in coumadin in clinic. Denies any bleeding issues - compliant with medications, she is on toprol, dilt and coumadin  2. HTN - checks bp at home once a week. Typically 100-120s/60-80s - compliant with meds  3. Dyspnea - swelling and DOE much improved with torsemide. We had started her on  daily, appears her pcp incrased to  daily.   Past Medical History  Diagnosis Date  . Persistent atrial fibrillation   . Obesity, morbid   . HTN (hypertension)   . DJD (degenerative joint disease)   . Rheumatic fever     denies  h/o  . Arthritis   . Insomnia   . Depression   . Bilateral knee pain      No Known Allergies   Current Outpatient Prescriptions  Medication Sig Dispense Refill  . ALPRAZolam (XANAX) 1 MG tablet Take 1 mg by mouth as needed. Anxiety    . aspirin (ASPIRIN LOW DOSE) 81 MG EC tablet Take 81 mg by mouth daily.      Marland Kitchen diltiazem (CARDIZEM CD) 360 MG 24 hr capsule Take 1 capsule (360 mg total) by mouth daily. 30 capsule 12  . lisinopril (PRINIVIL,ZESTRIL) 20 MG tablet TAKE ONE TABLET BY MOUTH ONCE DAILY. 30 tablet 6  . loperamide (IMODIUM) 2 MG capsule Take 2 mg by mouth 4 (four) times daily as needed. Diarrhea    . metoprolol succinate (TOPROL-XL) 100 MG 24 hr tablet TAKE (1) TABLET BY MOUTH EACH MORNING AND 1/2 TABLET IN THE EVENING. 45 tablet 3  . oxyCODONE-acetaminophen (PERCOCET) 10-325 MG per tablet Take 1 tablet by mouth every 6 (six) hours.    . torsemide (DEMADEX) 20 MG tablet TAKE ONE TABLET BY MOUTH ONCE DAILY. 90 tablet 3  . warfarin (COUMADIN) 5 MG tablet Take 1 tablet daily or as directed 45 tablet 6   No current facility-administered medications for this visit.     Past Surgical History  Procedure Laterality Date  . Bilateral knee  surgery    . Vesicovaginal fistula closure w/ tah    . Tonsillectomy    . Right ankle surgery    . Cholecystectomy  2001  . Cesarean section    . Abdominal hysterectomy       No Known Allergies    Family History  Problem Relation Age of Onset  . Cancer Father     Lung      Social History Jasmin Martin reports that she has never smoked. She has never used smokeless tobacco. [name] reports that she does not drink alcohol.   Review of Systems CONSTITUTIONAL: No weight loss, fever, chills, weakness or fatigue.  HEENT: Eyes: No visual loss, blurred vision, double vision or yellow sclerae.No hearing loss, sneezing, congestion, runny nose or sore throat.  SKIN: No rash or itching.  CARDIOVASCULAR: per HPI RESPIRATORY: No shortness of breath, cough or sputum.  GASTROINTESTINAL: No anorexia, nausea, vomiting or diarrhea. No abdominal pain or blood.  GENITOURINARY: No burning on urination, no polyuria NEUROLOGICAL: No headache, dizziness, syncope, paralysis, ataxia, numbness or tingling in the extremities. No change in bowel or bladder control.  MUSCULOSKELETAL: No muscle, back pain, joint pain or stiffness.  LYMPHATICS: No enlarged nodes. No history of splenectomy.  PSYCHIATRIC: No history of depression or anxiety.  ENDOCRINOLOGIC: No  reports of sweating, cold or heat intolerance. No polyuria or polydipsia.  Marland Kitchen.   Physical Examination p 75 bp 96/66 Wt 343 lbs BMI 59 Gen: resting comfortably, no acute distress HEENT: no scleral icterus, pupils equal round and reactive, no palptable cervical adenopathy,  CV: RRR, no m/r/g, no JVD, no carotid bruits Resp: Clear to auscultation bilaterally GI: abdomen is soft, non-tender, non-distended, normal bowel sounds, no hepatosplenomegaly MSK: extremities are warm, no edema.  Skin: warm, no rash Neuro:  no focal deficits Psych: appropriate affect   Diagnostic Studies 07/14/11 Echo: LVEF 50-55%, mild LVH, mod LAE     Assessment and  Plan  1. Afib - fairly minimal palpitations - continue current medications  2. HTN - at goal, continue current meds  3. Dyspnea - improved with more aggressive diuretic, continue.    F/u 6 months   Antoine PocheJonathan F. Branch, M.D.

## 2014-04-20 NOTE — Patient Instructions (Signed)
Your physician wants you to follow-up in: 6 months You will receive a reminder letter in the mail two months in advance. If you don't receive a letter, please call our office to schedule the follow-up appointment.     Your physician recommends that you continue on your current medications as directed. Please refer to the Current Medication list given to you today.      Thank you for choosing [facility] !        

## 2014-05-04 ENCOUNTER — Other Ambulatory Visit (HOSPITAL_COMMUNITY): Payer: Self-pay | Admitting: Pulmonary Disease

## 2014-05-04 ENCOUNTER — Ambulatory Visit (HOSPITAL_COMMUNITY)
Admission: RE | Admit: 2014-05-04 | Discharge: 2014-05-04 | Disposition: A | Discharge status: Home / Self Care | Payer: Medicaid Other | Source: Ambulatory Visit | Attending: Pulmonary Disease | Admitting: Pulmonary Disease

## 2014-05-04 DIAGNOSIS — M25551 Pain in right hip: Secondary | ICD-10-CM | POA: Diagnosis not present

## 2014-05-04 DIAGNOSIS — M545 Low back pain: Secondary | ICD-10-CM

## 2014-05-04 DIAGNOSIS — M8580 Other specified disorders of bone density and structure, unspecified site: Secondary | ICD-10-CM | POA: Insufficient documentation

## 2014-05-10 ENCOUNTER — Ambulatory Visit (INDEPENDENT_AMBULATORY_CARE_PROVIDER_SITE_OTHER): Payer: Medicaid Other | Admitting: *Deleted

## 2014-05-10 DIAGNOSIS — Z7901 Long term (current) use of anticoagulants: Secondary | ICD-10-CM

## 2014-05-10 DIAGNOSIS — I480 Paroxysmal atrial fibrillation: Secondary | ICD-10-CM

## 2014-05-10 DIAGNOSIS — Z5181 Encounter for therapeutic drug level monitoring: Secondary | ICD-10-CM

## 2014-05-10 DIAGNOSIS — I4891 Unspecified atrial fibrillation: Secondary | ICD-10-CM

## 2014-05-10 LAB — POCT INR: INR: 2.1

## 2014-05-13 ENCOUNTER — Ambulatory Visit (INDEPENDENT_AMBULATORY_CARE_PROVIDER_SITE_OTHER): Payer: Medicaid Other | Admitting: *Deleted

## 2014-05-13 DIAGNOSIS — I480 Paroxysmal atrial fibrillation: Secondary | ICD-10-CM

## 2014-05-13 DIAGNOSIS — Z7901 Long term (current) use of anticoagulants: Secondary | ICD-10-CM

## 2014-05-13 DIAGNOSIS — I4891 Unspecified atrial fibrillation: Secondary | ICD-10-CM

## 2014-05-13 DIAGNOSIS — Z5181 Encounter for therapeutic drug level monitoring: Secondary | ICD-10-CM

## 2014-05-13 LAB — POCT INR: INR: 1.4

## 2014-05-24 ENCOUNTER — Ambulatory Visit (INDEPENDENT_AMBULATORY_CARE_PROVIDER_SITE_OTHER): Payer: Medicaid Other | Admitting: *Deleted

## 2014-05-24 DIAGNOSIS — I480 Paroxysmal atrial fibrillation: Secondary | ICD-10-CM

## 2014-05-24 DIAGNOSIS — Z7901 Long term (current) use of anticoagulants: Secondary | ICD-10-CM

## 2014-05-24 DIAGNOSIS — Z5181 Encounter for therapeutic drug level monitoring: Secondary | ICD-10-CM

## 2014-05-24 DIAGNOSIS — I4891 Unspecified atrial fibrillation: Secondary | ICD-10-CM

## 2014-05-24 LAB — POCT INR: INR: 2.8

## 2014-06-16 ENCOUNTER — Encounter (HOSPITAL_COMMUNITY): Payer: Self-pay | Admitting: *Deleted

## 2014-06-16 ENCOUNTER — Observation Stay (HOSPITAL_COMMUNITY)
Admission: EM | Admit: 2014-06-16 | Discharge: 2014-06-18 | Disposition: A | Discharge status: Home / Self Care | Payer: Medicaid Other | Attending: Pulmonary Disease | Admitting: Pulmonary Disease

## 2014-06-16 ENCOUNTER — Emergency Department (HOSPITAL_COMMUNITY): Payer: Medicaid Other

## 2014-06-16 DIAGNOSIS — N179 Acute kidney failure, unspecified: Secondary | ICD-10-CM

## 2014-06-16 DIAGNOSIS — R0602 Shortness of breath: Secondary | ICD-10-CM

## 2014-06-16 DIAGNOSIS — F329 Major depressive disorder, single episode, unspecified: Secondary | ICD-10-CM | POA: Diagnosis not present

## 2014-06-16 DIAGNOSIS — Z7982 Long term (current) use of aspirin: Secondary | ICD-10-CM | POA: Insufficient documentation

## 2014-06-16 DIAGNOSIS — I482 Chronic atrial fibrillation: Secondary | ICD-10-CM | POA: Diagnosis not present

## 2014-06-16 DIAGNOSIS — I959 Hypotension, unspecified: Secondary | ICD-10-CM

## 2014-06-16 DIAGNOSIS — I481 Persistent atrial fibrillation: Secondary | ICD-10-CM | POA: Insufficient documentation

## 2014-06-16 DIAGNOSIS — Z79899 Other long term (current) drug therapy: Secondary | ICD-10-CM | POA: Insufficient documentation

## 2014-06-16 DIAGNOSIS — I10 Essential (primary) hypertension: Secondary | ICD-10-CM | POA: Insufficient documentation

## 2014-06-16 DIAGNOSIS — E669 Obesity, unspecified: Secondary | ICD-10-CM | POA: Insufficient documentation

## 2014-06-16 DIAGNOSIS — F411 Generalized anxiety disorder: Secondary | ICD-10-CM | POA: Diagnosis not present

## 2014-06-16 DIAGNOSIS — G4700 Insomnia, unspecified: Secondary | ICD-10-CM | POA: Insufficient documentation

## 2014-06-16 DIAGNOSIS — I950 Idiopathic hypotension: Secondary | ICD-10-CM

## 2014-06-16 DIAGNOSIS — Z7901 Long term (current) use of anticoagulants: Secondary | ICD-10-CM | POA: Diagnosis not present

## 2014-06-16 DIAGNOSIS — R079 Chest pain, unspecified: Principal | ICD-10-CM | POA: Insufficient documentation

## 2014-06-16 DIAGNOSIS — M1990 Unspecified osteoarthritis, unspecified site: Secondary | ICD-10-CM | POA: Diagnosis not present

## 2014-06-16 DIAGNOSIS — I509 Heart failure, unspecified: Secondary | ICD-10-CM | POA: Insufficient documentation

## 2014-06-16 DIAGNOSIS — I4819 Other persistent atrial fibrillation: Secondary | ICD-10-CM

## 2014-06-16 DIAGNOSIS — I4891 Unspecified atrial fibrillation: Secondary | ICD-10-CM | POA: Diagnosis present

## 2014-06-16 DIAGNOSIS — I5032 Chronic diastolic (congestive) heart failure: Secondary | ICD-10-CM | POA: Diagnosis present

## 2014-06-16 HISTORY — DX: Disorder of kidney and ureter, unspecified: N28.9

## 2014-06-16 HISTORY — DX: Heart failure, unspecified: I50.9

## 2014-06-16 LAB — TROPONIN I: Troponin I: <0.03 ng/mL (ref <0.031)

## 2014-06-16 LAB — BASIC METABOLIC PANEL
Anion gap: 10 (ref 5–15)
BUN: 25 mg/dL — AB (ref 6–23)
CHLORIDE: 101 mmol/L (ref 96–112)
CO2: 29 mmol/L (ref 19–32)
CREATININE: 1.29 mg/dL — AB (ref 0.50–1.10)
Calcium: 8.7 mg/dL (ref 8.4–10.5)
GFR calc non Af Amer: 44 mL/min — ABNORMAL LOW (ref >90)
GFR, EST AFRICAN AMERICAN: 51 mL/min — AB (ref >90)
GLUCOSE: 127 mg/dL — AB (ref 70–99)
Potassium: 3.5 mmol/L (ref 3.5–5.1)
Sodium: 140 mmol/L (ref 135–145)

## 2014-06-16 LAB — D-DIMER, QUANTITATIVE: D-Dimer, Quant: <0.27 ug/mL-FEU (ref 0.00–0.48)

## 2014-06-16 LAB — CBC WITH DIFFERENTIAL/PLATELET
Basophils Absolute: 0 10*3/uL (ref 0.0–0.1)
Basophils Relative: 1 % (ref 0–1)
Eosinophils Absolute: 0.2 10*3/uL (ref 0.0–0.7)
Eosinophils Relative: 3 % (ref 0–5)
HCT: 40.6 % (ref 36.0–46.0)
HEMOGLOBIN: 13.8 g/dL (ref 12.0–15.0)
LYMPHS ABS: 3 10*3/uL (ref 0.7–4.0)
LYMPHS PCT: 39 % (ref 12–46)
MCH: 32.8 pg (ref 26.0–34.0)
MCHC: 34 g/dL (ref 30.0–36.0)
MCV: 96.4 fL (ref 78.0–100.0)
Monocytes Absolute: 0.8 10*3/uL (ref 0.1–1.0)
Monocytes Relative: 10 % (ref 3–12)
NEUTROS PCT: 47 % (ref 43–77)
Neutro Abs: 3.8 10*3/uL (ref 1.7–7.7)
Platelets: 253 10*3/uL (ref 150–400)
RBC: 4.21 MIL/uL (ref 3.87–5.11)
RDW: 13.1 % (ref 11.5–15.5)
WBC: 7.8 10*3/uL (ref 4.0–10.5)

## 2014-06-16 LAB — PROTIME-INR
INR: 2.34 — ABNORMAL HIGH (ref 0.00–1.49)
Prothrombin Time: 25.9 seconds — ABNORMAL HIGH (ref 11.6–15.2)

## 2014-06-16 LAB — BRAIN NATRIURETIC PEPTIDE: B Natriuretic Peptide: 152 pg/mL — ABNORMAL HIGH (ref 0.0–100.0)

## 2014-06-16 MED ORDER — ASPIRIN EC 81 MG PO TBEC
81.0000 mg | DELAYED_RELEASE_TABLET | Freq: Every day | ORAL | Status: DC
  Administered 2014-06-17 – 2014-06-18 (×2): 81 mg via ORAL
  Filled 2014-06-16 (×2): qty 1

## 2014-06-16 MED ORDER — DILTIAZEM HCL ER COATED BEADS 180 MG PO CP24
360.0000 mg | ORAL_CAPSULE | Freq: Every day | ORAL | Status: DC
  Filled 2014-06-16 (×2): qty 1

## 2014-06-16 MED ORDER — POTASSIUM PHOSPHATE MONOBASIC 500 MG PO TABS
500.0000 mg | ORAL_TABLET | Freq: Every day | ORAL | Status: DC
  Filled 2014-06-16 (×2): qty 1

## 2014-06-16 MED ORDER — LOPERAMIDE HCL 2 MG PO CAPS: 2.0000 mg | ORAL_CAPSULE | ORAL | Status: DC | PRN

## 2014-06-16 MED ORDER — METOPROLOL SUCCINATE ER 50 MG PO TB24
100.0000 mg | ORAL_TABLET | Freq: Every day | ORAL | Status: DC
  Administered 2014-06-17 – 2014-06-18 (×2): 100 mg via ORAL
  Filled 2014-06-16 (×2): qty 1
  Filled 2014-06-16 (×2): qty 2

## 2014-06-16 MED ORDER — ONDANSETRON HCL 4 MG/2ML IJ SOLN: 4.0000 mg | Freq: Four times a day (QID) | INTRAMUSCULAR | Status: DC | PRN

## 2014-06-16 MED ORDER — OXYCODONE-ACETAMINOPHEN 10-325 MG PO TABS: 1.0000 | ORAL_TABLET | Freq: Four times a day (QID) | ORAL | Status: DC | PRN

## 2014-06-16 MED ORDER — GI COCKTAIL ~~LOC~~: 30.0000 mL | Freq: Four times a day (QID) | ORAL | Status: DC | PRN

## 2014-06-16 MED ORDER — METOPROLOL SUCCINATE ER 50 MG PO TB24
50.0000 mg | ORAL_TABLET | Freq: Every day | ORAL | Status: DC
  Filled 2014-06-16: qty 1

## 2014-06-16 MED ORDER — ACETAMINOPHEN 325 MG PO TABS: 650.0000 mg | ORAL_TABLET | ORAL | Status: DC | PRN

## 2014-06-16 MED ORDER — FUROSEMIDE 10 MG/ML IJ SOLN
40.0000 mg | Freq: Once | INTRAMUSCULAR | Status: AC
  Administered 2014-06-16: 40 mg via INTRAVENOUS
  Filled 2014-06-16: qty 4

## 2014-06-16 MED ORDER — LISINOPRIL 10 MG PO TABS
20.0000 mg | ORAL_TABLET | Freq: Every day | ORAL | Status: DC
  Administered 2014-06-17: 20 mg via ORAL
  Filled 2014-06-16 (×2): qty 2

## 2014-06-16 MED ORDER — ONDANSETRON HCL 4 MG PO TABS: 4.0000 mg | ORAL_TABLET | Freq: Four times a day (QID) | ORAL | Status: DC | PRN

## 2014-06-16 MED ORDER — ONDANSETRON HCL 4 MG/2ML IJ SOLN
4.0000 mg | Freq: Four times a day (QID) | INTRAMUSCULAR | Status: DC | PRN
  Administered 2014-06-18 (×2): 4 mg via INTRAVENOUS
  Filled 2014-06-16 (×3): qty 2

## 2014-06-16 MED ORDER — WARFARIN - PHARMACIST DOSING INPATIENT
Status: DC
  Administered 2014-06-17: 16:00:00

## 2014-06-16 MED ORDER — SODIUM CHLORIDE 0.9 % IJ SOLN
3.0000 mL | Freq: Two times a day (BID) | INTRAMUSCULAR | Status: DC
  Administered 2014-06-16 – 2014-06-17 (×3): 3 mL via INTRAVENOUS

## 2014-06-16 MED ORDER — SODIUM CHLORIDE 0.45 % IV SOLN
INTRAVENOUS | Status: DC
  Administered 2014-06-16 – 2014-06-17 (×3): via INTRAVENOUS

## 2014-06-16 MED ORDER — SODIUM CHLORIDE 0.9 % IV BOLUS (SEPSIS): 1000.0000 mL | Freq: Once | INTRAVENOUS | Status: DC

## 2014-06-16 MED ORDER — OXYCODONE-ACETAMINOPHEN 5-325 MG PO TABS
1.0000 | ORAL_TABLET | Freq: Four times a day (QID) | ORAL | Status: DC | PRN
  Administered 2014-06-17 – 2014-06-18 (×5): 1 via ORAL
  Filled 2014-06-16 (×5): qty 1

## 2014-06-16 MED ORDER — ALPRAZOLAM 1 MG PO TABS: 1.0000 mg | ORAL_TABLET | ORAL | Status: DC | PRN

## 2014-06-16 MED ORDER — ALPRAZOLAM 1 MG PO TABS
1.0000 mg | ORAL_TABLET | Freq: Three times a day (TID) | ORAL | Status: DC | PRN
  Administered 2014-06-17 (×2): 1 mg via ORAL
  Filled 2014-06-16 (×2): qty 1

## 2014-06-16 MED ORDER — OXYCODONE HCL 5 MG PO TABS
5.0000 mg | ORAL_TABLET | Freq: Four times a day (QID) | ORAL | Status: DC | PRN
  Administered 2014-06-17 – 2014-06-18 (×5): 5 mg via ORAL
  Filled 2014-06-16 (×5): qty 1

## 2014-06-16 NOTE — ED Notes (Signed)
MD at bedside. 

## 2014-06-16 NOTE — H&P (Signed)
[facility] History and Physical  Jasmin Martin ZOX:[id number] DOB: 09-Jun-1955 DOA: 06/16/2014  Referring physician: Dr. Adriana Simas - [facility] PCP: Fredirick Maudlin, MD   Chief Complaint: CP  HPI: Jasmin Martin is a 59 y.o. female  Acute onset chest pain - described as tightness. Started at 1600. Home blood pressure with a reading of 85/47 at that time. Walking through the house at the time of onset. Associated w/ dizziness, dyspnea, and nausea adn weak feeling. Initially O2 sats at home were in the 40s. Sat down and put on oxygen. Rechecked BP in 20 min. 113/74. Ans symptoms completely resolved until pt got up and walked around the house w/ return of symptoms. LE swelling at baseline. Compliant w/ Coumadin regimen. Pts friend drove pt to the hospital.   Review of Systems:  Constitutional: No weight loss, night sweats, Fevers, chills, fatigue.  HEENT:  No headaches, Difficulty swallowing,Tooth/dental problems,Sore throat,  No sneezing, itching, ear ache, nasal congestion, post nasal drip,  Cardio-vascular:  PEr HPI  GI:  No heartburn, indigestion, abdominal pain, nausea, vomiting, diarrhea, change in bowel habits, loss of appetite  Resp:   No excess mucus, no productive cough, No non-productive cough, No coughing up of blood.No change in color of mucus.No wheezing.No chest wall deformity  Skin:  no rash or lesions.  GU:  no dysuria, change in color of urine, no urgency or frequency. No flank pain.  Musculoskeletal:   No joint pain or swelling. No decreased range of motion. No back pain.  Psych:  No change in mood or affect. No depression or anxiety. No memory loss.   Past Medical History  Diagnosis Date  . Persistent atrial fibrillation   . Obesity, morbid   . HTN (hypertension)   . DJD (degenerative joint disease)   . Arthritis   . Insomnia   . Depression   . Bilateral knee pain   . CHF (congestive heart failure)    Past Surgical History  Procedure Laterality Date  .  Bilateral knee surgery    . Vesicovaginal fistula closure w/ tah    . Tonsillectomy    . Right ankle surgery    . Cholecystectomy  2001  . Cesarean section    . Abdominal hysterectomy     Social History:  reports that she has never smoked. Her smokeless tobacco use includes Chew. She reports that she does not drink alcohol or use illicit drugs.  No Known Allergies  Family History  Problem Relation Age of Onset  . Cancer Father     Lung      Prior to Admission medications   Medication Sig Start Date End Date Taking? Authorizing Provider  ALPRAZolam Prudy Feeler) 1 MG tablet Take 1 mg by mouth as needed. Anxiety   Yes Historical Provider, MD  aspirin (ASPIRIN LOW DOSE) 81 MG EC tablet Take 81 mg by mouth daily.     Yes Historical Provider, MD  diltiazem (CARDIZEM CD) 360 MG 24 hr capsule Take 1 capsule (360 mg total) by mouth daily. 08/01/11 06/16/14 Yes [name], MD  lisinopril (PRINIVIL,ZESTRIL) 20 MG tablet TAKE ONE TABLET BY MOUTH ONCE DAILY. 02/12/14  Yes Antoine Poche, MD  loperamide (IMODIUM) 2 MG capsule Take 2 mg by mouth 4 (four) times daily as needed. Diarrhea   Yes Historical Provider, MD  metoprolol succinate (TOPROL-XL) 100 MG 24 hr tablet TAKE (1) TABLET BY MOUTH EACH MORNING AND 1/2 TABLET IN THE EVENING. 04/13/14  Yes Antoine Poche, MD  oxyCODONE-acetaminophen (  PERCOCET) 10-325 MG per tablet Take 1 tablet by mouth every 6 (six) hours.   Yes Historical Provider, MD  potassium phosphate, monobasic, (K-PHOS ORIGINAL) 500 MG tablet Take 500 mg by mouth daily.   Yes Historical Provider, MD  PRESCRIPTION MEDICATION Take 1 tablet by mouth every evening. Cholesterol.   Yes Historical Provider, MD  torsemide (DEMADEX) 100 MG tablet Prescribed by dr Juanetta Goslinghawkins Patient taking differently: Take 50 mg by mouth daily. Prescribed by dr Juanetta Goslinghawkins 04/20/14  Yes Antoine PocheJonathan F Branch, MD  warfarin (COUMADIN) 5 MG tablet Take 1 tablet daily or as directed Patient taking differently: Take 5 mg  by mouth daily. Take 1 tablet daily or as directed 11/06/13  Yes Jodelle GrossKathryn M Lawrence, NP   Physical Exam: Filed Vitals:   06/16/14 1854 06/16/14 1917 06/16/14 2008 06/16/14 2030  BP: 131/109 109/79 100/51 93/58  Pulse: 68 50  57  Temp: 97.8 F (36.6 C)     TempSrc: Oral     Resp: 20 12 20 17   Height: 5\' 3"  (1.6 m)     Weight: 155.13 kg (342 lb)     SpO2: 95% 94% 93% 93%    Wt Readings from Last 3 Encounters:  06/16/14 155.13 kg (342 lb)  04/20/14 155.584 kg (343 lb)  07/22/13 154.223 kg (340 lb)    General: Morbidly obese,  Appears calm and comfortable Eyes:  PERRL, normal lids, irises & conjunctiva ENT:  grossly normal hearing, lips & tongue Neck:  no LAD, masses or thyromegaly Cardiovascular:  Irregularly irregular, III/VI systolic murmur. 1+ LE edema bilat.  Respiratory:  CTA bilaterally, no w/r/r. Normal respiratory effort. Abdomen:  soft, ntnd, obese, NABS Skin:  no rash or induration seen on limited exam Musculoskeletal:  grossly normal tone BUE/BLE Psychiatric:  grossly normal mood and affect, speech fluent and appropriate Neurologic:  grossly non-focal.          Labs on Admission:  Basic Metabolic Panel:  Recent Labs Lab 06/16/14 1949  NA 140  K 3.5  CL 101  CO2 29  GLUCOSE 127*  BUN 25*  CREATININE 1.29*  CALCIUM 8.7   Liver Function Tests: No results for input(s): AST, ALT, ALKPHOS, BILITOT, PROT, ALBUMIN in the last 168 hours. No results for input(s): LIPASE, AMYLASE in the last 168 hours. No results for input(s): AMMONIA in the last 168 hours. CBC:  Recent Labs Lab 06/16/14 1949  WBC 7.8  NEUTROABS 3.8  HGB 13.8  HCT 40.6  MCV 96.4  PLT 253   Cardiac Enzymes:  Recent Labs Lab 06/16/14 1949  TROPONINI <0.03    BNP (last 3 results) No results for input(s): BNP in the last 8760 hours.  ProBNP (last 3 results) No results for input(s): PROBNP in the last 8760 hours.  CBG: No results for input(s): GLUCAP in the last 168  hours.  Radiological Exams on Admission: Dg Chest Portable 1 View  06/16/2014   CLINICAL DATA:  Chest pain and tightness  EXAM: PORTABLE CHEST - 1 VIEW  COMPARISON:  05/12/2013  FINDINGS: Unchanged marked cardiopericardial enlargement. The main pulmonary artery remains enlarged suggesting pulmonary hypertension. The aortic contours are negative. Low lung volumes without edema, consolidation, effusion, or pneumothorax.  IMPRESSION: 1. Stable cardiomegaly and pulmonary artery enlargement. 2. Low lung volumes without pneumonia or edema.   Electronically Signed   By: Marnee SpringJonathon  Watts M.D.   On: 06/16/2014 20:25    EKG: Independently reviewed. Afib, No sign of ACS, Huston FoleyBrady  Assessment/Plan Principal Problem:   Chest  pain Active Problems:   Anxiety state   FIBRILLATION, ATRIAL   Diastolic CHF, chronic   Hypotension   Acute kidney injury   Chest pain: Cardiac versus pulmonary. Concern for pulmonary embolism despite anticoagulation given the patient's description of symptom onset and vitals. Patient with multiple risk factors for ACS or not immediately evident on EKG and troponin negative. Currently symptom-free until patient ambulates. Symptoms undoubtedly worsened secondary to anxiety state and obesity hypoventilation syndrome. - Tele - AMbulatory O2 sats  - Cycle troponins - D-dimer - CT in am after renal function improves and if D-dimer elevated - Gi Cocktail - EKG in am - INR  Chronic diastolic CHF: No signs of acute exacerbation. Weight stable per patient. Last echo 2013 with EF of 50-55% - BNP - Hold torsemide until assessment in the a.m. - Echo - Continue beta blocker and ACE inhibitor - Continue home potassium  Hypotension: Suspect cardiac etiology as patient without overt signs of infection or dehydration. Symptoms at home started approximately 1-1-1/2 hours after taking morning torsemide. Blood pressure initially stable in ED on arrival but dropped after being given Lasix.  Currently systolic blood pressure in the mid 80s - Normal saline 1 L bolus - Half-normal saline 1101ml/hr  Acute kidney injury: Creatinine 1.29 on admission. Previous baseline 0.6 in 2013. Maybe beginnings of chronic kidney disease secondary to hypertension, CHF. - BMP in a.m.  Chronic atrial fibrillation: Rate controlled. INR 2.8 in late February - Continue diltiazem, metoprolol, lisinopril - Continue Coumadin  Anxiety: - Continue Xanax when necessary  Hyperglycemia: 127 on admission - A1c  Code Status: FULL DVT Prophylaxis: coumadin Family Communication: Son and granddaughter Disposition Plan: pending improvement  Avan Gullett Shela Commons, MD Family Medicine [facility] [email or web address] Password [email or web address]

## 2014-06-16 NOTE — ED Notes (Signed)
Chest pain, onset 4 pm.

## 2014-06-16 NOTE — Progress Notes (Signed)
ANTICOAGULATION CONSULT NOTE - Initial Consult  Pharmacy Consult for Coumadin Indication: atrial fibrillation  No Known Allergies  Patient Measurements: Height: 5\' 3"  (160 cm) Weight: (!) 342 lb (155.13 kg) IBW/kg (Calculated) : 52.4  Vital Signs: Temp: 97.8 F (36.6 C) (03/16 1854) Temp Source: Oral (03/16 1854) BP: 101/58 mmHg (03/16 2130) Pulse Rate: 57 (03/16 2130)  Labs:  Recent Labs  06/16/14 1949  HGB 13.8  HCT 40.6  PLT 253  LABPROT 25.9*  INR 2.34*  CREATININE 1.29*  TROPONINI <0.03    Estimated Creatinine Clearance: 68.5 mL/min (by C-G formula based on Cr of 1.29).   Medical History: Past Medical History  Diagnosis Date  . Persistent atrial fibrillation   . Obesity, morbid   . HTN (hypertension)   . DJD (degenerative joint disease)   . Arthritis   . Insomnia   . Depression   . Bilateral knee pain   . CHF (congestive heart failure)     Medications:   (Not in a hospital admission)  Assessment: 59 yo F on chronic Coumadin for Afib.  Home dose listed above.  INR therapeutic on admission.  She has already taken Coumadin today.   No bleeding noted.   Goal of Therapy:  INR 2-3   Plan:  Anticipate continue Coumadin home dose tomorrow Daily INR  Aysha Livecchi, Mercy RidingAndrea Michelle 06/16/2014,10:32 PM

## 2014-06-16 NOTE — ED Notes (Signed)
Hospitalist at bedside 

## 2014-06-16 NOTE — ED Provider Notes (Signed)
CSN: [id number]     Arrival date & time 06/16/14  1844 History   First MD Initiated Contact with Patient 06/16/14 1851     Chief Complaint  Patient presents with  . Chest Pain     (Consider location/radiation/quality/duration/timing/severity/associated sxs/prior Treatment) HPI.... Chest tightness, dyspnea since earlier today at approximately 4 PM. Blood pressure at home was "85/47". Patient has history of obesity, congestive heart failure, atrial fibrillation, hypertension.  She feels weak. Severity is moderate. Exertion makes symptoms worse.  Past Medical History  Diagnosis Date  . Persistent atrial fibrillation   . Obesity, morbid   . HTN (hypertension)   . DJD (degenerative joint disease)   . Rheumatic fever     denies  h/o  . Arthritis   . Insomnia   . Depression   . Bilateral knee pain   . CHF (congestive heart failure)    Past Surgical History  Procedure Laterality Date  . Bilateral knee surgery    . Vesicovaginal fistula closure w/ tah    . Tonsillectomy    . Right ankle surgery    . Cholecystectomy  2001  . Cesarean section    . Abdominal hysterectomy     Family History  Problem Relation Age of Onset  . Cancer Father     Lung    History  Substance Use Topics  . Smoking status: Never Smoker   . Smokeless tobacco: Never Used  . Alcohol Use: No   OB History    No data available     Review of Systems  All other systems reviewed and are negative.     Allergies  Review of patient's allergies indicates no known allergies.  Home Medications   Prior to Admission medications   Medication Sig Start Date End Date Taking? Authorizing Provider  ALPRAZolam Prudy Feeler) 1 MG tablet Take 1 mg by mouth as needed. Anxiety   Yes Historical Provider, MD  aspirin (ASPIRIN LOW DOSE) 81 MG EC tablet Take 81 mg by mouth daily.     Yes Historical Provider, MD  diltiazem (CARDIZEM CD) 360 MG 24 hr capsule Take 1 capsule (360 mg total) by mouth daily. 08/01/11 06/16/14 Yes  [name], MD  lisinopril (PRINIVIL,ZESTRIL) 20 MG tablet TAKE ONE TABLET BY MOUTH ONCE DAILY. 02/12/14  Yes Antoine Poche, MD  loperamide (IMODIUM) 2 MG capsule Take 2 mg by mouth 4 (four) times daily as needed. Diarrhea   Yes Historical Provider, MD  metoprolol succinate (TOPROL-XL) 100 MG 24 hr tablet TAKE (1) TABLET BY MOUTH EACH MORNING AND 1/2 TABLET IN THE EVENING. 04/13/14  Yes Antoine Poche, MD  oxyCODONE-acetaminophen (PERCOCET) 10-325 MG per tablet Take 1 tablet by mouth every 6 (six) hours.   Yes Historical Provider, MD  potassium phosphate, monobasic, (K-PHOS ORIGINAL) 500 MG tablet Take 500 mg by mouth daily.   Yes Historical Provider, MD  PRESCRIPTION MEDICATION Take 1 tablet by mouth every evening. Cholesterol.   Yes Historical Provider, MD  torsemide (DEMADEX) 100 MG tablet Prescribed by dr Juanetta Gosling Patient taking differently: Take 50 mg by mouth daily. Prescribed by dr Juanetta Gosling 04/20/14  Yes Antoine Poche, MD  warfarin (COUMADIN) 5 MG tablet Take 1 tablet daily or as directed Patient taking differently: Take 5 mg by mouth daily. Take 1 tablet daily or as directed 11/06/13  Yes Jodelle Gross, NP   BP 93/58 mmHg  Pulse 57  Temp(Src) 97.8 F (36.6 C) (Oral)  Resp 17  Ht  (1.6 m)  Wt 342 lb (155.13 kg)  BMI 60.60 kg/m2  SpO2 93% Physical Exam  Constitutional: She is oriented to person, place, and time.  Obese  HENT:  Head: Normocephalic and atraumatic.  Eyes: Conjunctivae and EOM are normal. Pupils are equal, round, and reactive to light.  Neck: Normal range of motion. Neck supple.  Cardiovascular: Normal rate and regular rhythm.   Pulmonary/Chest: Effort normal and breath sounds normal.  Abdominal: Soft. Bowel sounds are normal.  Musculoskeletal: Normal range of motion.  Neurological: She is alert and oriented to person, place, and time.  Skin: Skin is warm and dry.  2-3+ peripheral edema  Psychiatric: She has a normal mood and affect. Her  behavior is normal.  Nursing note and vitals reviewed.   ED Course  Procedures (including critical care time) Labs Review Labs Reviewed  BASIC METABOLIC PANEL - Abnormal; Notable for the following:    Glucose, Bld 127 (*)    BUN 25 (*)    Creatinine, Ser 1.29 (*)    GFR calc non Af Amer 44 (*)    GFR calc Af Amer 51 (*)    All other components within normal limits  CBC WITH DIFFERENTIAL/PLATELET  TROPONIN I    Imaging Review Dg Chest Portable 1 View  06/16/2014   CLINICAL DATA:  Chest pain and tightness  EXAM: PORTABLE CHEST - 1 VIEW  COMPARISON:  05/12/2013  FINDINGS: Unchanged marked cardiopericardial enlargement. The main pulmonary artery remains enlarged suggesting pulmonary hypertension. The aortic contours are negative. Low lung volumes without edema, consolidation, effusion, or pneumothorax.  IMPRESSION: 1. Stable cardiomegaly and pulmonary artery enlargement. 2. Low lung volumes without pneumonia or edema.   Electronically Signed   By: Marnee SpringJonathon  Watts M.D.   On: 06/16/2014 20:25     EKG Interpretation   Date/Time:  Wednesday June 16 2014 18:54:09 EDT Ventricular Rate:  65 PR Interval:    QRS Duration: 115 QT Interval:  623 QTC Calculation: 648 R Axis:   -172 Text Interpretation:  Atrial fibrillation Paired ventricular premature  complexes Incomplete right bundle branch block Anteroseptal infarct, age  indeterminate Confirmed by Ocean Kearley  MD, Edlin Ford ([id number]) on 06/16/2014 7:17:31 PM      MDM   Final diagnoses:  Chest pain, unspecified chest pain type    Patient has multiple cardiac risk factors. Her blood pressure is low. She is not dyspneic. EKG shows atrial fibrillation but no ST segment changes. Chest x-ray reveals no edema. Admit to observation.    Donnetta HutchingBrian Ariyana Faw, MD 06/16/14 2113

## 2014-06-17 ENCOUNTER — Observation Stay (HOSPITAL_COMMUNITY): Payer: Medicaid Other

## 2014-06-17 DIAGNOSIS — I509 Heart failure, unspecified: Secondary | ICD-10-CM | POA: Diagnosis not present

## 2014-06-17 DIAGNOSIS — R079 Chest pain, unspecified: Secondary | ICD-10-CM | POA: Diagnosis not present

## 2014-06-17 DIAGNOSIS — I481 Persistent atrial fibrillation: Secondary | ICD-10-CM | POA: Diagnosis not present

## 2014-06-17 DIAGNOSIS — E669 Obesity, unspecified: Secondary | ICD-10-CM | POA: Diagnosis not present

## 2014-06-17 LAB — TROPONIN I: Troponin I: <0.03 ng/mL (ref <0.031)

## 2014-06-17 LAB — BASIC METABOLIC PANEL
Anion gap: 9 (ref 5–15)
BUN: 25 mg/dL — AB (ref 6–23)
CALCIUM: 8.4 mg/dL (ref 8.4–10.5)
CO2: 27 mmol/L (ref 19–32)
Chloride: 102 mmol/L (ref 96–112)
Creatinine, Ser: 0.96 mg/dL (ref 0.50–1.10)
GFR, EST AFRICAN AMERICAN: 73 mL/min — AB (ref >90)
GFR, EST NON AFRICAN AMERICAN: 63 mL/min — AB (ref >90)
GLUCOSE: 113 mg/dL — AB (ref 70–99)
Potassium: 3.3 mmol/L — ABNORMAL LOW (ref 3.5–5.1)
Sodium: 138 mmol/L (ref 135–145)

## 2014-06-17 LAB — PROTIME-INR
INR: 2.29 — ABNORMAL HIGH (ref 0.00–1.49)
Prothrombin Time: 25.4 seconds — ABNORMAL HIGH (ref 11.6–15.2)

## 2014-06-17 LAB — MRSA PCR SCREENING: MRSA by PCR: NEGATIVE

## 2014-06-17 MED ORDER — IOHEXOL 350 MG/ML SOLN
100.0000 mL | Freq: Once | INTRAVENOUS | Status: AC | PRN
  Administered 2014-06-17: 100 mL via INTRAVENOUS

## 2014-06-17 MED ORDER — DILTIAZEM HCL ER COATED BEADS 180 MG PO CP24
360.0000 mg | ORAL_CAPSULE | Freq: Every day | ORAL | Status: DC
  Administered 2014-06-17 – 2014-06-18 (×2): 360 mg via ORAL
  Filled 2014-06-17: qty 2

## 2014-06-17 MED ORDER — WARFARIN - PHARMACIST DOSING INPATIENT: Status: DC

## 2014-06-17 MED ORDER — POTASSIUM CHLORIDE CRYS ER 20 MEQ PO TBCR
20.0000 meq | EXTENDED_RELEASE_TABLET | Freq: Two times a day (BID) | ORAL | Status: DC
  Administered 2014-06-17 – 2014-06-18 (×3): 20 meq via ORAL
  Filled 2014-06-17 (×3): qty 1

## 2014-06-17 MED ORDER — SODIUM CHLORIDE 0.9 % IV BOLUS (SEPSIS)
500.0000 mL | Freq: Once | INTRAVENOUS | Status: AC
  Administered 2014-06-17: 500 mL via INTRAVENOUS

## 2014-06-17 MED ORDER — WARFARIN SODIUM 5 MG PO TABS
5.0000 mg | ORAL_TABLET | Freq: Once | ORAL | Status: AC
  Administered 2014-06-17: 5 mg via ORAL
  Filled 2014-06-17: qty 1

## 2014-06-17 NOTE — Progress Notes (Signed)
UR completed 

## 2014-06-17 NOTE — Progress Notes (Signed)
Subjective: She was admitted with chest discomfort and shortness of breath. She is known to have congestive heart failure and chronic atrial fibrillation. She says she's not having any pain now.  Objective: Vital signs in last 24 hours: Temp:  [97.6 F (36.4 C)-98 F (36.7 C)] 97.6 F (36.4 C) (03/17 0551) Pulse Rate:  [50-77] 67 (03/17 0551) Resp:  [12-28] 22 (03/17 0551) BP: (93-131)/(49-109) 98/77 mmHg (03/17 0551) SpO2:  [93 %-99 %] 99 % (03/17 0551) Weight:  [155.13 kg (342 lb)-159.621 kg (351 lb 14.4 oz)] 159.621 kg (351 lb 14.4 oz) (03/16 2306) Weight change:  Last BM Date: 06/16/14  Intake/Output from previous day:    PHYSICAL EXAM General appearance: alert, cooperative, no distress and morbidly obese Resp: clear to auscultation bilaterally Cardio: regularly irregular rhythm GI: soft, non-tender; bowel sounds normal; no masses,  no organomegaly Extremities: extremities normal, atraumatic, no cyanosis or edema  Lab Results:  Results for orders placed or performed during the hospital encounter of 06/16/14 (from the past 48 hour(s))  Basic metabolic panel     Status: Abnormal   Collection Time: 06/16/14  7:49 PM  Result Value Ref Range   Sodium 140 135 - 145 mmol/L   Potassium 3.5 3.5 - 5.1 mmol/L   Chloride 101 96 - 112 mmol/L   CO2 29 19 - 32 mmol/L   Glucose, Bld 127 (H) 70 - 99 mg/dL   BUN 25 (H) 6 - 23 mg/dL   Creatinine, Ser 1.29 (H) 0.50 - 1.10 mg/dL   Calcium 8.7 8.4 - 10.5 mg/dL   GFR calc non Af Amer 44 (L) >90 mL/min   GFR calc Af Amer 51 (L) >90 mL/min    Comment: (NOTE) The eGFR has been calculated using the CKD EPI equation. This calculation has not been validated in all clinical situations. eGFR's persistently <90 mL/min signify possible Chronic Kidney Disease.    Anion gap 10 5 - 15  CBC with Differential/Platelet     Status: None   Collection Time: 06/16/14  7:49 PM  Result Value Ref Range   WBC 7.8 4.0 - 10.5 K/uL   RBC 4.21 3.87 - 5.11  MIL/uL   Hemoglobin 13.8 12.0 - 15.0 g/dL   HCT 40.6 36.0 - 46.0 %   MCV 96.4 78.0 - 100.0 fL   MCH 32.8 26.0 - 34.0 pg   MCHC 34.0 30.0 - 36.0 g/dL   RDW 13.1 11.5 - 15.5 %   Platelets 253 150 - 400 K/uL   Neutrophils Relative % 47 43 - 77 %   Neutro Abs 3.8 1.7 - 7.7 K/uL   Lymphocytes Relative 39 12 - 46 %   Lymphs Abs 3.0 0.7 - 4.0 K/uL   Monocytes Relative 10 3 - 12 %   Monocytes Absolute 0.8 0.1 - 1.0 K/uL   Eosinophils Relative 3 0 - 5 %   Eosinophils Absolute 0.2 0.0 - 0.7 K/uL   Basophils Relative 1 0 - 1 %   Basophils Absolute 0.0 0.0 - 0.1 K/uL  Troponin I     Status: None   Collection Time: 06/16/14  7:49 PM  Result Value Ref Range   Troponin I <0.03 <0.031 ng/mL    Comment:        NO INDICATION OF MYOCARDIAL INJURY.   D-dimer, quantitative     Status: None   Collection Time: 06/16/14  7:49 PM  Result Value Ref Range   D-Dimer, Quant <0.27 0.00 - 0.48 ug/mL-FEU    Comment:  AT THE INHOUSE ESTABLISHED CUTOFF VALUE OF 0.48 ug/mL FEU, THIS ASSAY HAS BEEN DOCUMENTED IN THE LITERATURE TO HAVE A SENSITIVITY AND NEGATIVE PREDICTIVE VALUE OF AT LEAST 98 TO 99%.  THE TEST RESULT SHOULD BE CORRELATED WITH AN ASSESSMENT OF THE CLINICAL PROBABILITY OF DVT / VTE.   Brain natriuretic peptide     Status: Abnormal   Collection Time: 06/16/14  7:49 PM  Result Value Ref Range   B Natriuretic Peptide 152.0 (H) 0.0 - 100.0 pg/mL  Protime-INR     Status: Abnormal   Collection Time: 06/16/14  7:49 PM  Result Value Ref Range   Prothrombin Time 25.9 (H) 11.6 - 15.2 seconds   INR 2.34 (H) 0.00 - 1.49  Troponin I-serum (0, 3, 6 hours)     Status: None   Collection Time: 06/16/14 10:07 PM  Result Value Ref Range   Troponin I <0.03 <0.031 ng/mL    Comment:        NO INDICATION OF MYOCARDIAL INJURY.   Troponin I-serum (0, 3, 6 hours)     Status: None   Collection Time: 06/17/14 12:34 AM  Result Value Ref Range   Troponin I <0.03 <0.031 ng/mL    Comment:        NO  INDICATION OF MYOCARDIAL INJURY.   Troponin I-serum (0, 3, 6 hours)     Status: None   Collection Time: 06/17/14  4:13 AM  Result Value Ref Range   Troponin I <0.03 <0.031 ng/mL    Comment:        NO INDICATION OF MYOCARDIAL INJURY.   Basic metabolic panel     Status: Abnormal   Collection Time: 06/17/14  4:13 AM  Result Value Ref Range   Sodium 138 135 - 145 mmol/L   Potassium 3.3 (L) 3.5 - 5.1 mmol/L   Chloride 102 96 - 112 mmol/L   CO2 27 19 - 32 mmol/L   Glucose, Bld 113 (H) 70 - 99 mg/dL   BUN 25 (H) 6 - 23 mg/dL   Creatinine, Ser 0.96 0.50 - 1.10 mg/dL   Calcium 8.4 8.4 - 10.5 mg/dL   GFR calc non Af Amer 63 (L) >90 mL/min   GFR calc Af Amer 73 (L) >90 mL/min    Comment: (NOTE) The eGFR has been calculated using the CKD EPI equation. This calculation has not been validated in all clinical situations. eGFR's persistently <90 mL/min signify possible Chronic Kidney Disease.    Anion gap 9 5 - 15  Protime-INR     Status: Abnormal   Collection Time: 06/17/14  4:13 AM  Result Value Ref Range   Prothrombin Time 25.4 (H) 11.6 - 15.2 seconds   INR 2.29 (H) 0.00 - 1.49    ABGS No results for input(s): PHART, PO2ART, TCO2, HCO3 in the last 72 hours.  Invalid input(s): PCO2 CULTURES No results found for this or any previous visit (from the past 240 hour(s)). Studies/Results: Dg Chest Portable 1 View  06/16/2014   CLINICAL DATA:  Chest pain and tightness  EXAM: PORTABLE CHEST - 1 VIEW  COMPARISON:  05/12/2013  FINDINGS: Unchanged marked cardiopericardial enlargement. The main pulmonary artery remains enlarged suggesting pulmonary hypertension. The aortic contours are negative. Low lung volumes without edema, consolidation, effusion, or pneumothorax.  IMPRESSION: 1. Stable cardiomegaly and pulmonary artery enlargement. 2. Low lung volumes without pneumonia or edema.   Electronically Signed   By: Monte Fantasia M.D.   On: 06/16/2014 20:25    Medications:  Prior  to Admission:   Prescriptions prior to admission  Medication Sig Dispense Refill Last Dose  . ALPRAZolam (XANAX) 1 MG tablet Take 1 mg by mouth as needed. Anxiety   06/15/2014 at Unknown time  . aspirin (ASPIRIN LOW DOSE) 81 MG EC tablet Take 81 mg by mouth daily.     06/16/2014 at Unknown time  . diltiazem (CARDIZEM CD) 360 MG 24 hr capsule Take 1 capsule (360 mg total) by mouth daily. 30 capsule 12 06/16/2014 at Unknown time  . lisinopril (PRINIVIL,ZESTRIL) 20 MG tablet TAKE ONE TABLET BY MOUTH ONCE DAILY. 30 tablet 6 06/16/2014 at Unknown time  . loperamide (IMODIUM) 2 MG capsule Take 2 mg by mouth 4 (four) times daily as needed. Diarrhea   Past Month at Unknown time  . metoprolol succinate (TOPROL-XL) 100 MG 24 hr tablet TAKE (1) TABLET BY MOUTH EACH MORNING AND 1/2 TABLET IN THE EVENING. 45 tablet 3 06/16/2014 at 0900  . oxyCODONE-acetaminophen (PERCOCET) 10-325 MG per tablet Take 1 tablet by mouth every 6 (six) hours.   06/16/2014 at Unknown time  . potassium phosphate, monobasic, (K-PHOS ORIGINAL) 500 MG tablet Take 500 mg by mouth daily.   06/16/2014 at Unknown time  . PRESCRIPTION MEDICATION Take 1 tablet by mouth every evening. Cholesterol.   06/15/2014 at Unknown time  . torsemide (DEMADEX) 100 MG tablet Prescribed by dr Luan Pulling (Patient taking differently: Take 50 mg by mouth daily. Prescribed by dr Luan Pulling)   06/16/2014 at Unknown time  . warfarin (COUMADIN) 5 MG tablet Take 1 tablet daily or as directed (Patient taking differently: Take 5 mg by mouth daily. ) 45 tablet 6 06/16/2014 at Unknown time   Scheduled: . aspirin EC  81 mg Oral Daily  . diltiazem  360 mg Oral Daily  . lisinopril  20 mg Oral Daily  . metoprolol succinate  100 mg Oral Daily  . metoprolol succinate  50 mg Oral QHS  . potassium chloride  20 mEq Oral BID  . sodium chloride  1,000 mL Intravenous Once  . sodium chloride  3 mL Intravenous Q12H  . Warfarin - Pharmacist Dosing Inpatient   Does not apply Q24H   Continuous: . sodium  chloride 100 mL/hr at 06/16/14 2359   MGN:[id number], ALPRAZolam, gi cocktail, loperamide, ondansetron (ZOFRAN) IV, ondansetron **OR** ondansetron (ZOFRAN) IV, oxyCODONE-acetaminophen **AND** oxyCODONE  Assesment: She was admitted with chest pain. This was associated with hypotension. She was also short of breath. She does not have evidence of MI based on troponin and EKG at this point. She does have chronic atrial fibrillation and chronic diastolic heart failure. She is chronically anticoagulated. She was therapeutic with her anticoagulation but her history certainly would be consistent with pulmonary embolism Principal Problem:   Chest pain Active Problems:   Anxiety state   FIBRILLATION, ATRIAL   Diastolic CHF, chronic   Hypotension   Acute kidney injury    Plan: She will have cardiology consultation and have CT angiogram of the chest      Karmon Andis L 06/17/2014, 8:46 AM

## 2014-06-17 NOTE — Consult Note (Signed)
CARDIOLOGY CONSULT NOTE   Patient ID: Jasmin Martin MRN: [id number] DOB/AGE: [birth year] 61 y.o.  Admit Date: 06/16/2014 Referring Physician: [name] MD Primary Physician: Fredirick Maudlin, MD Consulting Cardiologist: Dina Rich MD Primary Cardiologist : Dina Rich MD Reason for Consultation: Chest Pain   Clinical Summary [name] is a 59 y.o.female with known history of atrial fibrillation, CHF,  CHADS VASC Score of 2. Hypertension, obesity, and chronic dyspnea. Admitted with chest discomfort and dyspnea.   She state she had just finished making bacon and biscuits for her granddaughter, and finished the dishes when she turned and felt vice-like chest pain, and as if the air went out of her lungs. She felt lightheaded. Took her BP and it was in the 80's systolic. She lied down for a while and put her O2 and BP came back up to 110. She states she took a bath and when she got out of the tub the sensation of chest tightness and trouble breathing recurred. She came to ER.   On arrival to ER BP 131/109, HR 68, O2 Sat 95%, Potassium 3.5, Creatinine 1.29, BNP 152.0. CXR Stable cardiomegaly and pulmonary artery enlargement. No pneumonia or edema. CT was negative for PE. Troponin negative X 4. She was treated with lasix IV and became hypotensive, given IV fluid bolus.   She states she drinks 6-20 oz flavored water bottles a day. She is medically compliant concerning torsemide 50 mg daily, diltiazem, and coumadin. She feels better today and has not had recurrence of symptoms. She denies rapid HR, near syncope, or diaphoresis.    No Known Allergies  Medications Scheduled Medications: . aspirin EC  81 mg Oral Daily  . diltiazem  360 mg Oral Daily  . lisinopril  20 mg Oral Daily  . metoprolol succinate  100 mg Oral Daily  . metoprolol succinate  50 mg Oral QHS  . potassium chloride  20 mEq Oral BID  . sodium chloride  1,000 mL Intravenous Once  . sodium chloride  3 mL  Intravenous Q12H  . warfarin  5 mg Oral Once  . Warfarin - Pharmacist Dosing Inpatient   Does not apply Q24H  . [START ON 06/18/2014] Warfarin - Pharmacist Dosing Inpatient   Does not apply Q24H    Infusions: . sodium chloride 100 mL/hr at 06/17/14 0957    PRN Medications: acetaminophen, ALPRAZolam, gi cocktail, loperamide, ondansetron (ZOFRAN) IV, ondansetron **OR** ondansetron (ZOFRAN) IV, oxyCODONE-acetaminophen **AND** oxyCODONE   Past Medical History  Diagnosis Date  . Persistent atrial fibrillation   . Obesity, morbid   . HTN (hypertension)   . DJD (degenerative joint disease)   . Arthritis   . Insomnia   . Depression   . Bilateral knee pain   . CHF (congestive heart failure)     Past Surgical History  Procedure Laterality Date  . Bilateral knee surgery    . Vesicovaginal fistula closure w/ tah    . Tonsillectomy    . Right ankle surgery    . Cholecystectomy  2001  . Cesarean section    . Abdominal hysterectomy      Family History  Problem Relation Age of Onset  . Cancer Father     Lung     Social History [name] reports that she has never smoked. Her smokeless tobacco use includes Chew. [name] reports that she does not drink alcohol.  Review of Systems Complete review of systems are found to be negative unless outlined in H&P above.  Physical Examination Blood pressure 98/77, pulse 67, temperature 97.6 F (36.4 C), temperature source Oral, resp. rate 22, height  (1.6 m), weight 351 lb 14.4 oz (159.621 kg), SpO2 99 %. No intake or output data in the 24 hours ending 06/17/14 1024  Telemetry: Atrial fibrillation.   GEN: No acute distress.  HEENT: Conjunctiva and lids normal, oropharynx clear with moist mucosa. Neck: Supple, no elevated JVP or carotid bruits, no thyromegaly. Lungs: Clear to auscultation, nonlabored breathing at rest. Cardiac: Iregular rate and rhythm, no S3 or significant systolic murmur, no pericardial rub. Abdomen: Soft,  nontender, no hepatomegaly, bowel sounds present, no guarding or rebound. Extremities: No pitting edema, distal pulses 2+. Skin: Warm and dry. Musculoskeletal: No kyphosis. Neuropsychiatric: Alert and oriented x3, affect grossly appropriate.  Prior Cardiac Testing/Procedures 1. Echocardiogram 07/31/2011 Left ventricle: The cavity size was mildly dilated. There was mild concentric hypertrophy. Systolic function was normal. The estimated ejection fraction was in the range of 50% to 55%. Wall motion was normal; there were no regional wall motion abnormalities. - Aortic valve: Mildly calcified annulus. Trileaflet; mildly thickened, mildly calcified leaflets. - Mitral valve: Moderately calcified annulus. - Left atrium: The atrium was moderately dilated. - Right ventricle: The cavity size was normal. Wall thickness was mildly increased. - Right atrium: The atrium was mildly to moderately dilated. - Atrial septum: No defect or patent foramen ovale was identified.  2. NM Study 12/2001 IMPRESSION NON-DIAGNOSTIC PHARMACOLOGIC STRESS CARDIOLITE STUDY. NO SIGNIFICANT STRESS -INDUCED EKG CHANGES.SUPRAVENTRICULAR TACHYCARDIA OCCURRED WITH DOBUTAMINE INFUSION. NO SYMPTOMS STRONGLY SUGGESTIVE OF MYOCARDIAL ISCHEMIA. CARDIOLITE IMAGES WERE OF EXTREMELY LIMITED TECHNICAL QUALITY; BREAST ATTENUATION COULD ACCOUNT FOR THE ABNORMALITIES NOTED, BUT MYOCARDIA ISCHEMIA IN THE DISTRIBUTION OF THE LEFT ANTERIOR DESCENDING CORONARY ARTERY CANNOT BE UNEQUIVOCALLY EXCLUDED.  Lab Results  Basic Metabolic Panel:  Recent Labs Lab 06/16/14 1949 06/17/14 0413  NA 140 138  K 3.5 3.3*  CL 101 102  CO2 29 27  GLUCOSE 127* 113*  BUN 25* 25*  CREATININE 1.29* 0.96  CALCIUM 8.7 8.4   CBC:  Recent Labs Lab 06/16/14 1949  WBC 7.8  NEUTROABS 3.8  HGB 13.8  HCT 40.6  MCV 96.4  PLT 253    Cardiac Enzymes:  Recent Labs Lab 06/16/14 1949 06/16/14 2207 06/17/14 0034 06/17/14 0413   TROPONINI <0.03 <0.03 <0.03 <0.03    Radiology: Ct Angio Chest Pe W/cm &/or Wo Cm  06/17/2014   CLINICAL DATA:  Increased shortness of breath. Hypertension. Blood pressure dropped. Obesity.  EXAM: CT ANGIOGRAPHY CHEST WITH CONTRAST  TECHNIQUE: Multidetector CT imaging of the chest was performed using the standard protocol during bolus administration of intravenous contrast. Multiplanar CT image reconstructions and MIPs were obtained to evaluate the vascular anatomy.  CONTRAST:  OMNIPAQUE IOHEXOL 350 MG/ML SOLN  COMPARISON:  07/18/2009 and 05/23/2009  FINDINGS: No evidence for pulmonary embolism. Again noted are lymph nodes in the prevascular space but these lymph nodes are smaller compared to the previous examination on 07/18/2009. There continues to be prominent subcarinal tissue which measures 1.9 cm in the short axis, previously measured 2.3 cm. Left inferior hilar low it measures 1.1 cm short axis previous measured 1.4 cm. There is no significant pericardial or pleural fluid. Heart size is mildly enlarged. Images of the upper abdomen are unremarkable.  The trachea and mainstem bronchi are patent. There is a 4 mm pleural-based nodule in the medial right lower lobe on sequence 5, image 43. This small pleural-based nodule was not clearly present in  2011. Areas of air trapping, particularly the lower lobes. Few pleural-based densities at the left lung base are suggestive for scarring and/or atelectasis. No significant airspace disease. Stable punctate calcified granuloma in the left upper lung on sequence 5, image 30.  No acute bone abnormality.  Review of the MIP images confirms the above findings.  IMPRESSION: No evidence for pulmonary embolism.  No acute chest findings.  Decreased chest lymphadenopathy since 2011.  Scattered areas of air trapping in both lungs.  Indeterminate 4 mm pleural-based nodule in the right lower lobe. If the patient is at high risk for bronchogenic carcinoma, follow-up chest  CT at 1 year is recommended. If the patient is at low risk, no follow-up is needed. This recommendation follows the consensus statement: Guidelines for Management of Small Pulmonary Nodules Detected on CT Scans: A Statement from the Fleischner Society as published in Radiology 2005; 237:395-400.   Electronically Signed   By: Richarda OverlieAdam  Henn M.D.   On: 06/17/2014 09:26   Dg Chest Portable 1 View  06/16/2014   CLINICAL DATA:  Chest pain and tightness  EXAM: PORTABLE CHEST - 1 VIEW  COMPARISON:  05/12/2013  FINDINGS: Unchanged marked cardiopericardial enlargement. The main pulmonary artery remains enlarged suggesting pulmonary hypertension. The aortic contours are negative. Low lung volumes without edema, consolidation, effusion, or pneumothorax.  IMPRESSION: 1. Stable cardiomegaly and pulmonary artery enlargement. 2. Low lung volumes without pneumonia or edema.   Electronically Signed   By: Marnee SpringJonathon  Watts M.D.   On: 06/16/2014 20:25     ECG: Atrial fibrillation with PVC's and Incomplete RBBB.    Impression and Recommendations  1. Hypotension: Would consider decreasing diuretic with normal LV fx. Creatinine has improved since stopping po torsemide and fluid bolus after IV lasix in ER.She is also on lisinopril 20 mg daily. BP is labile from high 90's systolic to 130's systolic. With atrial fib this may be inaccurate.   2. Atrial fibrillation: Heart rate is in the 60's and 70's on telemetry with some episodes of elevated HR into the 90's. She is on diltiazem 360 mg and metoprolol 100 mg daily.   3. Chronic Diastolic CHF: Does not appear to be fluid overloaded on exam. Will discuss with Dr. Wyline MoodBranch changes in diuretic therapy.     Signed: Bettey MareKathryn M. Lawrence NP [facility]  06/17/2014, 10:24 AM Co-Sign MD  Patient seen and discussed with NP Lyman BishopLawrence this AM. I agree with her documentation above. 59 yo female hx of afib, HTN, and chronic diastolic heart failure admitted with chest pain, SOB and hypotension.  Episode occurred while standing in kitchen. Suddenly felt dizzy, lightheaded, with squeezing feeling in chest and severe SOB. Lasts just a few minutes. Reports she checked her bp and it was low in the 80s, reportedly heart rate was in 40s by her vitals machine check.   07/2011 Echo: LVEF 50-55%, no WMAs, diastolic function not described BNP 152, D-dimer <0.27, Hgb 13.8, Plt 253, Cr 1.29 (baseline 2 years ago 0.63), BUN 25, GFR 44, trop neg x 3,  CXR +cardiomegaly, no edema CT PE no PE, no acute process EKG afib, low voltage  Elevated Cr and BUN that trended down with IVF suggests she was prerenal and hypovolemic as part of her presentation with hypotension, likely over diuresed on toresmide. BP's improved this afternoon. Continue to hold diuretic. EKG and troponins without evidence of ACS, CT PE negative. Will f/u echo results. Keep NPO tonight for possible stress test tomorrow AM.   Dominga FerryJ Kateryn Marasigan MD

## 2014-06-17 NOTE — Progress Notes (Signed)
*  PRELIMINARY RESULTS* Echocardiogram 2D Echocardiogram has been performed.  Jasmin Martin 06/17/2014, 12:35 PM 

## 2014-06-17 NOTE — Progress Notes (Signed)
ANTICOAGULATION CONSULT NOTE  Pharmacy Consult for Coumadin Indication: atrial fibrillation  No Known Allergies  Patient Measurements: Height: 5\' 3"  (160 cm) Weight: (!) 351 lb 14.4 oz (159.621 kg) IBW/kg (Calculated) : 52.4  Vital Signs: Temp: 97.6 F (36.4 C) (03/17 0551) Temp Source: Oral (03/17 0551) BP: 98/77 mmHg (03/17 0551) Pulse Rate: 67 (03/17 0551)  Labs:  Recent Labs  06/16/14 1949 06/16/14 2207 06/17/14 0034 06/17/14 0413  HGB 13.8  --   --   --   HCT 40.6  --   --   --   PLT 253  --   --   --   LABPROT 25.9*  --   --  25.4*  INR 2.34*  --   --  2.29*  CREATININE 1.29*  --   --  0.96  TROPONINI <0.03 <0.03 <0.03 <0.03   Estimated Creatinine Clearance: 93.8 mL/min (by C-G formula based on Cr of 0.96).  Medical History: Past Medical History  Diagnosis Date  . Persistent atrial fibrillation   . Obesity, morbid   . HTN (hypertension)   . DJD (degenerative joint disease)   . Arthritis   . Insomnia   . Depression   . Bilateral knee pain   . CHF (congestive heart failure)    Medications:  Prescriptions prior to admission  Medication Sig Dispense Refill Last Dose  . ALPRAZolam (XANAX) 1 MG tablet Take 1 mg by mouth as needed. Anxiety   06/15/2014 at Unknown time  . aspirin (ASPIRIN LOW DOSE) 81 MG EC tablet Take 81 mg by mouth daily.     06/16/2014 at Unknown time  . diltiazem (CARDIZEM CD) 360 MG 24 hr capsule Take 1 capsule (360 mg total) by mouth daily. 30 capsule 12 06/16/2014 at Unknown time  . lisinopril (PRINIVIL,ZESTRIL) 20 MG tablet TAKE ONE TABLET BY MOUTH ONCE DAILY. 30 tablet 6 06/16/2014 at Unknown time  . loperamide (IMODIUM) 2 MG capsule Take 2 mg by mouth 4 (four) times daily as needed. Diarrhea   Past Month at Unknown time  . metoprolol succinate (TOPROL-XL) 100 MG 24 hr tablet TAKE (1) TABLET BY MOUTH EACH MORNING AND 1/2 TABLET IN THE EVENING. 45 tablet 3 06/16/2014 at 0900  . oxyCODONE-acetaminophen (PERCOCET) 10-325 MG per tablet Take 1  tablet by mouth every 6 (six) hours.   06/16/2014 at Unknown time  . potassium phosphate, monobasic, (K-PHOS ORIGINAL) 500 MG tablet Take 500 mg by mouth daily.   06/16/2014 at Unknown time  . PRESCRIPTION MEDICATION Take 1 tablet by mouth every evening. Cholesterol.   06/15/2014 at Unknown time  . torsemide (DEMADEX) 100 MG tablet Prescribed by dr Juanetta Goslinghawkins (Patient taking differently: Take 50 mg by mouth daily. Prescribed by dr Juanetta Goslinghawkins)   06/16/2014 at Unknown time  . warfarin (COUMADIN) 5 MG tablet Take 1 tablet daily or as directed (Patient taking differently: Take 5 mg by mouth daily. ) 45 tablet 6 06/16/2014 at Unknown time   Assessment: 59 yo F on chronic Coumadin for Afib.  Home dose listed above.  INR remains therapeutic.  No bleeding noted.   Goal of Therapy:  INR 2-3   Plan:  Coumadin 5mg  PO x 1 today. Daily INR  Mady GemmaHayes, Charolette Bultman R 06/17/2014,8:49 AM

## 2014-06-17 NOTE — Care Management Note (Addendum)
    Page 1 of 1   06/18/2014     12:38:56 PM CARE MANAGEMENT NOTE 06/18/2014  Patient:  Jasmin Martin,Jasmin Martin   Account Number:  [id number]  Date Initiated:  06/17/2014  Documentation initiated by:  Sharrie RothmanBLACKWELL,Sheronda Parran C  Subjective/Objective Assessment:   Pt admitted from home with CP and a fib. Pt lives with her granddaughter and will return home at discharge. Pt has a cane and walker for home use. Pt has home O2 with [facility].     Action/Plan:   No CM needs noted. Anticipate discharge within 24 hours.   Anticipated DC Date:  06/18/2014   Anticipated DC Plan:  HOME/SELF CARE      DC Planning Services  CM consult      Choice offered to / List presented to:             Status of service:  Completed, signed off Medicare Important Message given?   (If response is "NO", the following Medicare IM given date fields will be blank) Date Medicare IM given:   Medicare IM given by:   Date Additional Medicare IM given:   Additional Medicare IM given by:    Discharge Disposition:  HOME/SELF CARE  Per UR Regulation:    If discussed at Long Length of Stay Meetings, dates discussed:    Comments:  06/18/14 1235 Arlyss Queenammy Janee Ureste, RN BSN CM Pt discharged home today. No CM needs noted.  06/17/14 1130 Arlyss Queenammy Hasan Douse, RN BSN CM

## 2014-06-18 ENCOUNTER — Ambulatory Visit (INDEPENDENT_AMBULATORY_CARE_PROVIDER_SITE_OTHER): Payer: Self-pay | Admitting: Internal Medicine

## 2014-06-18 ENCOUNTER — Encounter (HOSPITAL_COMMUNITY): Payer: Self-pay

## 2014-06-18 ENCOUNTER — Observation Stay (HOSPITAL_COMMUNITY): Payer: Medicaid Other

## 2014-06-18 ENCOUNTER — Telehealth: Payer: Self-pay | Admitting: *Deleted

## 2014-06-18 DIAGNOSIS — N179 Acute kidney failure, unspecified: Secondary | ICD-10-CM | POA: Diagnosis not present

## 2014-06-18 DIAGNOSIS — I5032 Chronic diastolic (congestive) heart failure: Secondary | ICD-10-CM

## 2014-06-18 DIAGNOSIS — E876 Hypokalemia: Secondary | ICD-10-CM

## 2014-06-18 DIAGNOSIS — I9589 Other hypotension: Secondary | ICD-10-CM

## 2014-06-18 DIAGNOSIS — Z5181 Encounter for therapeutic drug level monitoring: Secondary | ICD-10-CM

## 2014-06-18 DIAGNOSIS — I4891 Unspecified atrial fibrillation: Secondary | ICD-10-CM

## 2014-06-18 DIAGNOSIS — I482 Chronic atrial fibrillation: Secondary | ICD-10-CM | POA: Diagnosis not present

## 2014-06-18 DIAGNOSIS — R079 Chest pain, unspecified: Secondary | ICD-10-CM | POA: Diagnosis not present

## 2014-06-18 LAB — BASIC METABOLIC PANEL
ANION GAP: 10 (ref 5–15)
BUN: 25 mg/dL — ABNORMAL HIGH (ref 6–23)
CALCIUM: 8.5 mg/dL (ref 8.4–10.5)
CO2: 25 mmol/L (ref 19–32)
Chloride: 99 mmol/L (ref 96–112)
Creatinine, Ser: 1.12 mg/dL — ABNORMAL HIGH (ref 0.50–1.10)
GFR calc non Af Amer: 52 mL/min — ABNORMAL LOW (ref >90)
GFR, EST AFRICAN AMERICAN: 61 mL/min — AB (ref >90)
Glucose, Bld: 138 mg/dL — ABNORMAL HIGH (ref 70–99)
Potassium: 4.4 mmol/L (ref 3.5–5.1)
Sodium: 134 mmol/L — ABNORMAL LOW (ref 135–145)

## 2014-06-18 LAB — PROTIME-INR
INR: 2.23 — AB (ref 0.00–1.49)
Prothrombin Time: 24.9 seconds — ABNORMAL HIGH (ref 11.6–15.2)

## 2014-06-18 MED ORDER — WARFARIN SODIUM 5 MG PO TABS: 5.0000 mg | ORAL_TABLET | Freq: Every day | ORAL | Status: DC

## 2014-06-18 MED ORDER — TECHNETIUM TC 99M SESTAMIBI GENERIC - CARDIOLITE
30.0000 | Freq: Once | INTRAVENOUS | Status: AC | PRN
  Administered 2014-06-18: 30 via INTRAVENOUS

## 2014-06-18 MED ORDER — REGADENOSON 0.4 MG/5ML IV SOLN
0.4000 mg | Freq: Once | INTRAVENOUS | Status: AC | PRN
  Administered 2014-06-18: 0.4 mg via INTRAVENOUS
  Filled 2014-06-18: qty 5

## 2014-06-18 MED ORDER — REGADENOSON 0.4 MG/5ML IV SOLN
INTRAVENOUS | Status: AC
  Administered 2014-06-18: 0.4 mg via INTRAVENOUS
  Filled 2014-06-18: qty 5

## 2014-06-18 MED ORDER — TECHNETIUM TC 99M SESTAMIBI - CARDIOLITE
10.0000 | Freq: Once | INTRAVENOUS | Status: AC | PRN
Start: 2014-06-18 — End: 2014-06-18
  Administered 2014-06-18: 10 via INTRAVENOUS

## 2014-06-18 MED ORDER — TORSEMIDE 100 MG PO TABS: ORAL_TABLET | ORAL | Status: DC

## 2014-06-18 MED ORDER — SODIUM CHLORIDE 0.9 % IJ SOLN
10.0000 mL | INTRAMUSCULAR | Status: DC | PRN
  Administered 2014-06-18: 10 mL via INTRAVENOUS
  Filled 2014-06-18: qty 10

## 2014-06-18 MED ORDER — WARFARIN SODIUM 5 MG PO TABS: ORAL_TABLET | ORAL | Status: DC

## 2014-06-18 MED ORDER — SODIUM CHLORIDE 0.9 % IJ SOLN
INTRAMUSCULAR | Status: AC
  Administered 2014-06-18: 10 mL via INTRAVENOUS
  Filled 2014-06-18: qty 3

## 2014-06-18 NOTE — Progress Notes (Signed)
Notified Dr. Juanetta GoslingHawkins of the patients orthostatic vital signs as asked.  Verified patients heart medications as the patient stated that Dr. Juanetta GoslingHawkins wanted her to discontinue.  MD stated that he wanted the patient Lisinopril and not to take any diuretics today.  I will continue to follow the patient.

## 2014-06-18 NOTE — Progress Notes (Signed)
Consulting cardiologist: Prentice Docker MD Primary Cardiologist: Dina Rich MD  Cardiology Specific Problem List:  1. Atrial fibrillation 2. Hypotension 3. Chronic Diastolic CHF 4. Chest pain   Subjective:    No chest pain. States her BP dropped overnight. Patient assessed in stress lab prior to [facility].  Objective:   Temp:  [97.4 F (36.3 C)-98 F (36.7 C)] 97.4 F (36.3 C) (03/18 0533) Pulse Rate:  [56-83] 83 (03/18 0533) Resp:  [20] 20 (03/18 0533) BP: (102-106)/(43-78) 102/46 mmHg (03/18 0533) SpO2:  [90 %-96 %] 90 % (03/18 0533) Weight:  [356 lb 7.7 oz (161.7 kg)] 356 lb 7.7 oz (161.7 kg) (03/18 0533) Last BM Date: 06/16/14  Filed Weights   06/16/14 1854 06/16/14 2306 06/18/14 0533  Weight: 342 lb (155.13 kg) 351 lb 14.4 oz (159.621 kg) 356 lb 7.7 oz (161.7 kg)    Intake/Output Summary (Last 24 hours) at 06/18/14 0859 Last data filed at 06/17/14 1812  Gross per 24 hour  Intake    240 ml  Output    500 ml  Net   -260 ml    Telemetry: Atrial fib rates in the 70's.   Exam:  General: No acute distress.  HEENT: Conjunctiva and lids normal, oropharynx clear.  Lungs: Clear to auscultation, nonlabored.  Cardiac: No elevated JVP or bruits. RRR, no gallop or rub.   Abdomen: Normoactive bowel sounds, nontender, nondistended. Obese.   Extremities: No pitting edema, distal pulses full.  Neuropsychiatric: Alert and oriented x3, affect appropriate.   Lab Results:  Basic Metabolic Panel:  Recent Labs Lab 06/16/14 1949 06/17/14 0413  NA 140 138  K 3.5 3.3*  CL 101 102  CO2 29 27  GLUCOSE 127* 113*  BUN 25* 25*  CREATININE 1.29* 0.96  CALCIUM 8.7 8.4    CBC:  Recent Labs Lab 06/16/14 1949  WBC 7.8  HGB 13.8  HCT 40.6  MCV 96.4  PLT 253    Cardiac Enzymes:  Recent Labs Lab 06/16/14 2207 06/17/14 0034 06/17/14 0413  TROPONINI <0.03 <0.03 <0.03    Coagulation:  Recent Labs Lab 06/16/14 1949  06/17/14 0413 06/18/14 0706  INR 2.34* 2.29* 2.23*    Radiology: Ct Angio Chest Pe W/cm &/or Wo Cm  06/17/2014   CLINICAL DATA:  Increased shortness of breath. Hypertension. Blood pressure dropped. Obesity.  EXAM: CT ANGIOGRAPHY CHEST WITH CONTRAST  TECHNIQUE: Multidetector CT imaging of the chest was performed using the standard protocol during bolus administration of intravenous contrast. Multiplanar CT image reconstructions and MIPs were obtained to evaluate the vascular anatomy.  CONTRAST:  OMNIPAQUE IOHEXOL 350 MG/ML SOLN  COMPARISON:  07/18/2009 and 05/23/2009  FINDINGS: No evidence for pulmonary embolism. Again noted are lymph nodes in the prevascular space but these lymph nodes are smaller compared to the previous examination on 07/18/2009. There continues to be prominent subcarinal tissue which measures 1.9 cm in the short axis, previously measured 2.3 cm. Left inferior hilar low it measures 1.1 cm short axis previous measured 1.4 cm. There is no significant pericardial or pleural fluid. Heart size is mildly enlarged. Images of the upper abdomen are unremarkable.  The trachea and mainstem bronchi are patent. There is a 4 mm pleural-based nodule in the medial right lower lobe on sequence 5, image 43. This small pleural-based nodule was not clearly present in 2011. Areas of air trapping, particularly the lower lobes. Few pleural-based densities at the left lung base are suggestive for scarring and/or atelectasis. No significant airspace  disease. Stable punctate calcified granuloma in the left upper lung on sequence 5, image 30.  No acute bone abnormality.  Review of the MIP images confirms the above findings.  IMPRESSION: No evidence for pulmonary embolism.  No acute chest findings.  Decreased chest lymphadenopathy since 2011.  Scattered areas of air trapping in both lungs.  Indeterminate 4 mm pleural-based nodule in the right lower lobe. If the patient is at high risk for bronchogenic  carcinoma, follow-up chest CT at 1 year is recommended. If the patient is at low risk, no follow-up is needed. This recommendation follows the consensus statement: Guidelines for Management of Small Pulmonary Nodules Detected on CT Scans: A Statement from the Fleischner Society as published in Radiology 2005; 237:395-400.   Electronically Signed   By: Richarda Overlie M.D.   On: 06/17/2014 09:26   Dg Chest Portable 1 View  06/16/2014   CLINICAL DATA:  Chest pain and tightness  EXAM: PORTABLE CHEST - 1 VIEW  COMPARISON:  05/12/2013  FINDINGS: Unchanged marked cardiopericardial enlargement. The main pulmonary artery remains enlarged suggesting pulmonary hypertension. The aortic contours are negative. Low lung volumes without edema, consolidation, effusion, or pneumothorax.  IMPRESSION: 1. Stable cardiomegaly and pulmonary artery enlargement. 2. Low lung volumes without pneumonia or edema.   Electronically Signed   By: Marnee Spring M.D.   On: 06/16/2014 20:25     Medications:   Scheduled Medications: . aspirin EC  81 mg Oral Daily  . diltiazem  360 mg Oral Daily  . lisinopril  20 mg Oral Daily  . metoprolol succinate  100 mg Oral Daily  . metoprolol succinate  50 mg Oral QHS  . potassium chloride  20 mEq Oral BID  . sodium chloride  1,000 mL Intravenous Once  . sodium chloride  3 mL Intravenous Q12H  . warfarin  5 mg Oral q1800  . Warfarin - Pharmacist Dosing Inpatient   Does not apply Q24H    Infusions: . sodium chloride 100 mL/hr at 06/17/14 2218    PRN Medications: acetaminophen, ALPRAZolam, gi cocktail, loperamide, ondansetron (ZOFRAN) IV, ondansetron **OR** ondansetron (ZOFRAN) IV, oxyCODONE-acetaminophen **AND** oxyCODONE, sodium chloride, technetium sestamibi generic   Assessment and Plan:   1. Chest Pain: Patient had no ST-T wave changes indicative of ischemia during Lexiscan stress test. Nuclear scintigraphy to follow.  2. Hypotension: Still holding diuretic. She was pre-renal on  admission with torsemide 50 mg at home. She is also drinking a lot of energy/flavored water at home as well. Creatinine is 0.96 this am compared to 1.29 yesterday.  EF is 55%-60% per echo.   3. Chronic Diastolic CHF: No evidence of fluid overload this am off of torsemide. Will continue to hold and may change to low dose lasix on discharge.   Jasmin Martin. Lawrence NP [facility]ACC   The patient was seen and examined, and I agree with the assessment and plan as documented above, with modifications as noted below. Pt admitted with chest pain and hypotension. Appears to have been over diuresed. Will repeat BMET today as K was 3.3 yesterday and to reassess renal function. I personally reviewed results of nuclear myocardial perfusion study, which did not demonstrate any evidence of myocardial ischemia or scar. Calculated LVEF 30% but this is inaccurate and due to gating abnormalities, as echocardiogram performed on 3/17 showed normal LV systolic function, EF 55-60%. Would recommend she be placed on either low-dose maintenance diuretic at time of discharge vs prn use. Will defer to PCP and follow up with  Dr. Wyline MoodBranch as outpatient. No further recommendations. 06/18/2014, 8:59 AM

## 2014-06-18 NOTE — Progress Notes (Signed)
Stress Lab Nurses Notes - Jasmin Martin  Jasmin Martin 06/18/2014 Reason for doing test: Chest Pain and Dyspnea Type of test: Lexiscan Cardiolite/ Inpatient Rm 332 Nurse performing test: Parke PoissonPhyllis Billingsly, RN Nuclear Medicine Tech: Lyndel Pleasureyan Liles Echo Tech: Not Applicable MD performing test: Koneswaran/K.Lyman BishopLawrence NP Family MD: Juanetta GoslingHawkins Test explained and consent signed: Yes.   IV started: IV in progress from floor and No redness or edema Symptoms: Nausea & dizziness Treatment/Intervention: None Reason test stopped: protocol completed After recovery IV was: Left intact (rate of 100) and No redness or edema Patient to return to Nuc. Med at : 9:15 Patient discharged: [facility] Patient's Condition upon discharge was: stable Comments: During test BP 217/132 & HR 88.  Recovery BP 122/64 & HR 62 .  Symptoms resolved in recovery.  Erskine SpeedBillingsley, Anndrea Mihelich T

## 2014-06-18 NOTE — Progress Notes (Signed)
Patient discharged with instructions, prescription, and care notes.  Verbalized understanding via teach back.  IV was removed and the site was WNL. Patient voiced no further complaints or concerns at the time of discharge.  Appointments scheduled per instructions.  Patient left the floor via ambulation with staff and family in stable condition. 

## 2014-06-18 NOTE — Progress Notes (Signed)
ANTICOAGULATION CONSULT NOTE  Pharmacy Consult for Coumadin Indication: atrial fibrillation  No Known Allergies  Patient Measurements: Height: 5\' 3"  (160 cm) Weight: (!) 356 lb 7.7 oz (161.7 kg) IBW/kg (Calculated) : 52.4  Vital Signs: Temp: 97.4 F (36.3 C) (03/18 0533) Temp Source: Oral (03/18 0533) BP: 102/46 mmHg (03/18 0533) Pulse Rate: 83 (03/18 0533)  Labs:  Recent Labs  06/16/14 1949 06/16/14 2207 06/17/14 0034 06/17/14 0413 06/18/14 0706  HGB 13.8  --   --   --   --   HCT 40.6  --   --   --   --   PLT 253  --   --   --   --   LABPROT 25.9*  --   --  25.4* 24.9*  INR 2.34*  --   --  2.29* 2.23*  CREATININE 1.29*  --   --  0.96  --   TROPONINI <0.03 <0.03 <0.03 <0.03  --    Estimated Creatinine Clearance: 94.5 mL/min (by C-G formula based on Cr of 0.96).  Medical History: Past Medical History  Diagnosis Date  . Persistent atrial fibrillation   . Obesity, morbid   . HTN (hypertension)   . DJD (degenerative joint disease)   . Arthritis   . Insomnia   . Depression   . Bilateral knee pain   . CHF (congestive heart failure)   . Renal insufficiency    Medications:  Prescriptions prior to admission  Medication Sig Dispense Refill Last Dose  . ALPRAZolam (XANAX) 1 MG tablet Take 1 mg by mouth as needed. Anxiety   06/15/2014 at Unknown time  . aspirin (ASPIRIN LOW DOSE) 81 MG EC tablet Take 81 mg by mouth daily.     06/16/2014 at Unknown time  . diltiazem (CARDIZEM CD) 360 MG 24 hr capsule Take 1 capsule (360 mg total) by mouth daily. 30 capsule 12 06/16/2014 at Unknown time  . lisinopril (PRINIVIL,ZESTRIL) 20 MG tablet TAKE ONE TABLET BY MOUTH ONCE DAILY. 30 tablet 6 06/16/2014 at Unknown time  . loperamide (IMODIUM) 2 MG capsule Take 2 mg by mouth 4 (four) times daily as needed. Diarrhea   Past Month at Unknown time  . metoprolol succinate (TOPROL-XL) 100 MG 24 hr tablet TAKE (1) TABLET BY MOUTH EACH MORNING AND 1/2 TABLET IN THE EVENING. 45 tablet 3 06/16/2014  at 0900  . oxyCODONE-acetaminophen (PERCOCET) 10-325 MG per tablet Take 1 tablet by mouth every 6 (six) hours.   06/16/2014 at Unknown time  . potassium phosphate, monobasic, (K-PHOS ORIGINAL) 500 MG tablet Take 500 mg by mouth daily.   06/16/2014 at Unknown time  . pravastatin (PRAVACHOL) 40 MG tablet Take 40 mg by mouth daily.   06/15/2014  . torsemide (DEMADEX) 100 MG tablet Prescribed by dr Juanetta Goslinghawkins (Patient taking differently: Take 50 mg by mouth daily. Prescribed by dr Juanetta Goslinghawkins)   06/16/2014 at Unknown time  . warfarin (COUMADIN) 5 MG tablet Take 1 tablet daily or as directed (Patient taking differently: Take 5 mg by mouth daily. ) 45 tablet 6 06/16/2014 at Unknown time   Assessment: 59 yo F on chronic Coumadin for Afib.  Home dose listed above.  INR remains therapeutic.  No bleeding noted. Chest CT negative for PE.  Goal of Therapy:  INR 2-3   Plan:  Coumadin 5mg  PO daily INR on MWF   [place], Amelia Macken Michelle 06/18/2014,8:49 AM

## 2014-06-18 NOTE — Telephone Encounter (Signed)
Pt just had INR check in hospital and needs to know when she should come back.

## 2014-06-18 NOTE — Telephone Encounter (Signed)
Pt was in hospital for CP, INR checked will dose.  Attempted to return call to pt, LMOM TCB.  See anticoagulation note in Epic for dosage instructions and follow-up.

## 2014-06-23 NOTE — Discharge Summary (Signed)
Physician Discharge Summary  Patient ID: Jasmin Martin MRN: [id number] DOB/AGE: 1956-02-01 59 y.o. Primary Care Physician:Tearia Gibbs L, MD Admit date: 06/16/2014 Discharge date: 06/23/2014    Discharge Diagnoses:   Principal Problem:   Chest pain Active Problems:   Anxiety state   FIBRILLATION, ATRIAL   Diastolic CHF, chronic   Hypotension   Acute kidney injury  pulmonary nodule    Medication List    STOP taking these medications        lisinopril 20 MG tablet  Commonly known as:  PRINIVIL,ZESTRIL      TAKE these medications        ALPRAZolam 1 MG tablet  Commonly known as:  XANAX  Take 1 mg by mouth as needed. Anxiety     ASPIRIN LOW DOSE 81 MG EC tablet  Generic drug:  aspirin  Take 81 mg by mouth daily.     diltiazem 240 MG 24 hr tablet  Commonly known as:  CARDIZEM LA  Take 240 mg by mouth daily.     loperamide 2 MG capsule  Commonly known as:  IMODIUM  Take 2 mg by mouth 4 (four) times daily as needed. Diarrhea     metoprolol succinate 100 MG 24 hr tablet  Commonly known as:  TOPROL-XL  TAKE (1) TABLET BY MOUTH EACH MORNING AND 1/2 TABLET IN THE EVENING.     oxyCODONE-acetaminophen 10-325 MG per tablet  Commonly known as:  PERCOCET  Take 1 tablet by mouth every 6 (six) hours.     potassium phosphate (monobasic) 500 MG tablet  Commonly known as:  K-PHOS ORIGINAL  Take 500 mg by mouth daily.     pravastatin 40 MG tablet  Commonly known as:  PRAVACHOL  Take 40 mg by mouth daily.     torsemide 100 MG tablet  Commonly known as:  DEMADEX  Take only if you gain weight of 3 pounds or develop edema     warfarin 5 MG tablet  Commonly known as:  COUMADIN  Take 1 tablet daily or as directed        Discharged Condition: Improved    Consults: Cardiology  Significant Diagnostic Studies: Ct Angio Chest Pe W/cm &/or Wo Cm  06/17/2014   CLINICAL DATA:  Increased shortness of breath. Hypertension. Blood pressure dropped. Obesity.  EXAM: CT  ANGIOGRAPHY CHEST WITH CONTRAST  TECHNIQUE: Multidetector CT imaging of the chest was performed using the standard protocol during bolus administration of intravenous contrast. Multiplanar CT image reconstructions and MIPs were obtained to evaluate the vascular anatomy.  CONTRAST:  OMNIPAQUE IOHEXOL 350 MG/ML SOLN  COMPARISON:  07/18/2009 and 05/23/2009  FINDINGS: No evidence for pulmonary embolism. Again noted are lymph nodes in the prevascular space but these lymph nodes are smaller compared to the previous examination on 07/18/2009. There continues to be prominent subcarinal tissue which measures 1.9 cm in the short axis, previously measured 2.3 cm. Left inferior hilar low it measures 1.1 cm short axis previous measured 1.4 cm. There is no significant pericardial or pleural fluid. Heart size is mildly enlarged. Images of the upper abdomen are unremarkable.  The trachea and mainstem bronchi are patent. There is a 4 mm pleural-based nodule in the medial right lower lobe on sequence 5, image 43. This small pleural-based nodule was not clearly present in 2011. Areas of air trapping, particularly the lower lobes. Few pleural-based densities at the left lung base are suggestive for scarring and/or atelectasis. No significant airspace disease. Stable punctate calcified granuloma in  the left upper lung on sequence 5, image 30.  No acute bone abnormality.  Review of the MIP images confirms the above findings.  IMPRESSION: No evidence for pulmonary embolism.  No acute chest findings.  Decreased chest lymphadenopathy since 2011.  Scattered areas of air trapping in both lungs.  Indeterminate 4 mm pleural-based nodule in the right lower lobe. If the patient is at high risk for bronchogenic carcinoma, follow-up chest CT at 1 year is recommended. If the patient is at low risk, no follow-up is needed. This recommendation follows the consensus statement: Guidelines for Management of Small Pulmonary Nodules Detected on CT  Scans: A Statement from the Fleischner Society as published in Radiology 2005; 237:395-400.   Electronically Signed   By: Richarda Overlie M.D.   On: 06/17/2014 09:26   Nm Myocar Multi W/spect W/wall Motion / Ef  06/18/2014   CLINICAL DATA:  59 year old woman with obesity and atrial fibrillation with chronic dyspnea and chest discomfort referred for an ischemic evaluation.  EXAM: MYOCARDIAL IMAGING WITH SPECT (REST AND PHARMACOLOGIC-STRESS)  GATED LEFT VENTRICULAR WALL MOTION STUDY  LEFT VENTRICULAR EJECTION FRACTION  TECHNIQUE: Standard myocardial SPECT imaging was performed after resting intravenous injection of 10 mCi Tc-91m sestamibi. Subsequently, intravenous infusion of Lexiscan was performed under the supervision of the Cardiology staff. At peak effect of the drug, 30 mCi Tc-35m sestamibi was injected intravenously and standard myocardial SPECT imaging was performed. Quantitative gated imaging was also performed to evaluate left ventricular wall motion, and estimate left ventricular ejection fraction.  COMPARISON:  None.  FINDINGS: Stress data: The patient was stressed according to the Lexiscan protocol. The heart rate ranged from 67 up to 91 beats per min. The blood pressure range from 152/107 up to 217/132, indicating a hypertensive response. No chest pain was reported.  Resting ECG demonstrated atrial fibrillation with an isolated PVC. PVCs were noted with stress and in recovery. There were diffuse nonspecific T-wave abnormalities with no ischemic ST segment changes noted.  Perfusion: No decreased activity in the left ventricle on stress imaging to suggest reversible ischemia or infarction.  Wall Motion: Normal left ventricular wall motion. No left ventricular dilation.  Left Ventricular Ejection Fraction: 30 %  End diastolic volume 100 ml  End systolic volume 70 ml  IMPRESSION: 1. No reversible ischemia or infarction. Hypertensive response to stress.  2. Normal left ventricular wall motion.  3. Left  ventricular ejection fraction 30%. However, this is likely due to gating abnormalities as echocardiogram on 06/17/14 reported normal LV systolic function, LVEF 55-60%. Function appears grossly normal on nuclear images.  4. Low-risk stress test findings*.  *2012 Appropriate Use Criteria for Coronary Revascularization Focused Update: J Am Coll Cardiol. 2012;59(9):857-881. http://content.dementiazones.com.aspx?articleid=[email or web address]   Electronically Signed   By: Prentice Docker   On: 06/18/2014 10:24   Dg Chest Portable 1 View  06/16/2014   CLINICAL DATA:  Chest pain and tightness  EXAM: PORTABLE CHEST - 1 VIEW  COMPARISON:  05/12/2013  FINDINGS: Unchanged marked cardiopericardial enlargement. The main pulmonary artery remains enlarged suggesting pulmonary hypertension. The aortic contours are negative. Low lung volumes without edema, consolidation, effusion, or pneumothorax.  IMPRESSION: 1. Stable cardiomegaly and pulmonary artery enlargement. 2. Low lung volumes without pneumonia or edema.   Electronically Signed   By: Marnee Spring M.D.   On: 06/16/2014 20:25    Lab Results: Basic Metabolic Panel: No results for input(s): NA, K, CL, CO2, GLUCOSE, BUN, CREATININE, CALCIUM, MG, PHOS in the last 72 hours. Liver Function Tests:  No results for input(s): AST, ALT, ALKPHOS, BILITOT, PROT, ALBUMIN in the last 72 hours.   CBC: No results for input(s): WBC, NEUTROABS, HGB, HCT, MCV, PLT in the last 72 hours.  Recent Results (from the past 240 hour(s))  MRSA PCR Screening     Status: None   Collection Time: 06/17/14  8:35 AM  Result Value Ref Range Status   MRSA by PCR NEGATIVE NEGATIVE Final    Comment:        The GeneXpert MRSA Assay (FDA approved for NASAL specimens only), is one component of a comprehensive MRSA colonization surveillance program. It is not intended to diagnose MRSA infection nor to guide or monitor treatment for MRSA infections.      Hospital Course: She was admitted  with chest pain. She had low blood pressure associated with this. She has chronic atrial fibrillation and chronic heart failure. She was evaluated and felt to have some element of dehydration probably from over diuresis. Cardiology consultation was obtained and she underwent stress test which was negative. She had no further symptoms in the hospital and after her stress test was reported she was discharged home. She did have elimination of lisinopril at least for now.  Discharge Exam: Blood pressure 117/79, pulse 82, temperature 97.6 F (36.4 C), temperature source Oral, resp. rate 20, height 5\' 3"  (1.6 m), weight 161.7 kg (356 lb 7.7 oz), SpO2 94 %. She is morbidly obese. She is in atrial fibrillation. Her chest is clear  Disposition: Home      Discharge Instructions    Discharge patient    Complete by:  As directed   After lunch  If she is stable           Follow-up Information    Follow up with Brach Birdsall L, MD.   Specialty:  Pulmonary Disease   Why:  For Follow up appointment for one week.   Contact information:   [address] [place] [id number] [phone]       Signed: Cherelle Midkiff L   06/23/2014, 7:28 AM

## 2014-07-19 ENCOUNTER — Ambulatory Visit (INDEPENDENT_AMBULATORY_CARE_PROVIDER_SITE_OTHER): Payer: Medicaid Other | Admitting: *Deleted

## 2014-07-19 DIAGNOSIS — Z5181 Encounter for therapeutic drug level monitoring: Secondary | ICD-10-CM

## 2014-07-19 DIAGNOSIS — I480 Paroxysmal atrial fibrillation: Secondary | ICD-10-CM

## 2014-07-19 LAB — POCT INR: INR: 1.9

## 2014-08-04 ENCOUNTER — Telehealth: Payer: Self-pay | Admitting: *Deleted

## 2014-08-04 ENCOUNTER — Ambulatory Visit: Payer: Self-pay | Admitting: *Deleted

## 2014-08-04 DIAGNOSIS — Z5181 Encounter for therapeutic drug level monitoring: Secondary | ICD-10-CM

## 2014-08-04 DIAGNOSIS — I4891 Unspecified atrial fibrillation: Secondary | ICD-10-CM

## 2014-08-04 NOTE — Telephone Encounter (Signed)
Noted.  Pt D/C from coumadin clinic.

## 2014-08-04 NOTE — Telephone Encounter (Signed)
Patient's PCP (Dr.Hawkins) stopped patient's Coumadin and started on Eliquis. / tg

## 2014-10-01 ENCOUNTER — Other Ambulatory Visit: Payer: Self-pay | Admitting: Cardiology

## 2014-11-03 ENCOUNTER — Encounter: Payer: Self-pay | Admitting: Cardiology

## 2014-11-03 ENCOUNTER — Ambulatory Visit: Payer: Medicaid Other | Admitting: Cardiology

## 2014-11-03 ENCOUNTER — Ambulatory Visit (INDEPENDENT_AMBULATORY_CARE_PROVIDER_SITE_OTHER): Payer: Medicaid Other | Admitting: Cardiology

## 2014-11-03 VITALS — BP 130/70 | HR 119 | Ht 64.0 in | Wt 355.8 lb

## 2014-11-03 DIAGNOSIS — E785 Hyperlipidemia, unspecified: Secondary | ICD-10-CM

## 2014-11-03 DIAGNOSIS — I4891 Unspecified atrial fibrillation: Secondary | ICD-10-CM

## 2014-11-03 DIAGNOSIS — R0600 Dyspnea, unspecified: Secondary | ICD-10-CM

## 2014-11-03 DIAGNOSIS — I10 Essential (primary) hypertension: Secondary | ICD-10-CM | POA: Diagnosis not present

## 2014-11-03 NOTE — Patient Instructions (Signed)
Your physician wants you to follow-up in: 6 months with Dr Lurena Joiner will receive a reminder letter in the mail two months in advance. If you don't receive a letter, please call our office to schedule the follow-up appointment.    Your physician recommends that you continue on your current medications as directed. Please refer to the Current Medication list given to you today.     Get FASTING lab work: lipid and LFT's     Thank you for choosing [facility] !

## 2014-11-03 NOTE — Progress Notes (Addendum)
Patient ID: Jasmin Martin, female   DOB: Mar 17, 1956, 59 y.o.   MRN: [id number]     Clinical Summary Ms. Ritchey is a 59 y.o.female seen today for follow up of the following medical problems.   1. Afib - notes occasional palpitations. 2-3 times a week, lasts 1-5 minutes - denies any bleeding issues on eliquis.   2. HTN - checks bp at home once a week. Typically 120s/70-80s - compliant with meds  3. Dyspnea - compliant with torsemide, taking torsemide  daily. Notes poor diet, weight gain since last visit but she does not think fluid related.    4. Nocturnal hypoxia - uses home O2 at night, reports negative sleep study for OSA  Past Medical History  Diagnosis Date  . Persistent atrial fibrillation   . Obesity, morbid   . HTN (hypertension)   . DJD (degenerative joint disease)   . Arthritis   . Insomnia   . Depression   . Bilateral knee pain   . CHF (congestive heart failure)   . Renal insufficiency      No Known Allergies   Current Outpatient Prescriptions  Medication Sig Dispense Refill  . ALPRAZolam (XANAX) 1 MG tablet Take 1 mg by mouth as needed. Anxiety    . aspirin (ASPIRIN LOW DOSE) 81 MG EC tablet Take 81 mg by mouth daily.      Marland Kitchen diltiazem (CARDIZEM LA) 240 MG 24 hr tablet Take 240 mg by mouth daily.    Marland Kitchen lisinopril (PRINIVIL,ZESTRIL) 20 MG tablet TAKE ONE TABLET BY MOUTH ONCE DAILY. 30 tablet 6  . loperamide (IMODIUM) 2 MG capsule Take 2 mg by mouth 4 (four) times daily as needed. Diarrhea    . metoprolol succinate (TOPROL-XL) 100 MG 24 hr tablet TAKE (1) TABLET BY MOUTH EACH MORNING AND 1/2 TABLET IN THE EVENING. 45 tablet 3  . oxyCODONE-acetaminophen (PERCOCET) 10-325 MG per tablet Take 1 tablet by mouth every 6 (six) hours.    . potassium phosphate, monobasic, (K-PHOS ORIGINAL) 500 MG tablet Take 500 mg by mouth daily.    . pravastatin (PRAVACHOL) 40 MG tablet Take 40 mg by mouth daily.    Marland Kitchen torsemide (DEMADEX) 100 MG tablet Take only if you gain weight  of 3 pounds or develop edema    . warfarin (COUMADIN) 5 MG tablet Take 1 tablet daily or as directed 45 tablet 6   No current facility-administered medications for this visit.     Past Surgical History  Procedure Laterality Date  . Bilateral knee surgery    . Vesicovaginal fistula closure w/ tah    . Tonsillectomy    . Right ankle surgery    . Cholecystectomy  2001  . Cesarean section    . Abdominal hysterectomy       No Known Allergies    Family History  Problem Relation Age of Onset  . Cancer Father     Lung      Social History Ms. Donnellan reports that she has never smoked. Her smokeless tobacco use includes Chew. Ms. Stander reports that she does not drink alcohol.   Review of Systems CONSTITUTIONAL: No weight loss, fever, chills, weakness or fatigue.  HEENT: Eyes: No visual loss, blurred vision, double vision or yellow sclerae.No hearing loss, sneezing, congestion, runny nose or sore throat.  SKIN: No rash or itching.  CARDIOVASCULAR: per HPI RESPIRATORY: No shortness of breath, cough or sputum.  GASTROINTESTINAL: No anorexia, nausea, vomiting or diarrhea. No abdominal pain or blood.  GENITOURINARY:  No burning on urination, no polyuria NEUROLOGICAL: No headache, dizziness, syncope, paralysis, ataxia, numbness or tingling in the extremities. No change in bowel or bladder control.  MUSCULOSKELETAL: No muscle, back pain, joint pain or stiffness.  LYMPHATICS: No enlarged nodes. No history of splenectomy.  PSYCHIATRIC: No history of depression or anxiety.  ENDOCRINOLOGIC: No reports of sweating, cold or heat intolerance. No polyuria or polydipsia.  Marland Kitchen   Physical Examination Filed Vitals:   11/03/14 1317  BP: 130/70  Pulse: 119   Filed Vitals:   11/03/14 1317  Height: 5\' 4"  (1.626 m)  Weight: 355 lb 12.8 oz (161.39 kg)    Gen: resting comfortably, no acute distress HEENT: no scleral icterus, pupils equal round and reactive, no palptable cervical adenopathy,    CV: RRR, no m/r/g, no JVD Resp: Clear to auscultation bilaterally GI: abdomen is soft, non-tender, non-distended, normal bowel sounds, no hepatosplenomegaly MSK: extremities are warm, no edema.  Skin: warm, no rash Neuro:  no focal deficits Psych: appropriate affect   Diagnostic Studies  07/14/11 Echo: LVEF 50-55%, mild LVH, mod LAE  06/2014 MPI IMPRESSION: 1. No reversible ischemia or infarction. Hypertensive response to stress.  2. Normal left ventricular wall motion.  3. Left ventricular ejection fraction 30%. However, this is likely due to gating abnormalities as echocardiogram on 06/17/14 reported normal LV systolic function, LVEF 55-60%. Function appears grossly normal on nuclear images.  4. Low-risk stress test findings*.   Assessment and Plan   1. Afib - fairly minimal palpitations. Pulse elevated by dynamap, on manual check normal at 80 - continue current medications. Continue eliquis for stroke prevention.   2. HTN - at goal, continue current meds  3. Dyspnea - improved with more aggressive diuretic, continue   F/u 6 months  Antoine Poche, M.D.

## 2014-11-04 LAB — BASIC METABOLIC PANEL
BUN: 15 mg/dL (ref 7–25)
CO2: 29 mmol/L (ref 20–31)
CREATININE: 0.68 mg/dL (ref 0.50–0.99)
Calcium: 8.7 mg/dL (ref 8.6–10.4)
Chloride: 103 mmol/L (ref 98–110)
GLUCOSE: 131 mg/dL — AB (ref 65–99)
POTASSIUM: 4.1 mmol/L (ref 3.5–5.3)
SODIUM: 142 mmol/L (ref 135–146)

## 2014-11-04 LAB — MAGNESIUM: MAGNESIUM: 1.9 mg/dL (ref 1.5–2.5)

## 2014-11-04 LAB — LIPID PANEL
CHOLESTEROL: 169 mg/dL (ref 125–200)
HDL: 40 mg/dL — ABNORMAL LOW (ref >=46)
LDL Cholesterol: 90 mg/dL (ref <130)
Total CHOL/HDL Ratio: 4.2 Ratio (ref <=5.0)
Triglycerides: 193 mg/dL — ABNORMAL HIGH (ref <150)
VLDL: 39 mg/dL — AB (ref <30)

## 2014-11-08 ENCOUNTER — Other Ambulatory Visit: Payer: Self-pay

## 2014-11-08 DIAGNOSIS — R739 Hyperglycemia, unspecified: Secondary | ICD-10-CM

## 2014-11-09 LAB — HEMOGLOBIN A1C
HEMOGLOBIN A1C: 6.5 % — AB (ref <5.7)
Mean Plasma Glucose: 140 mg/dL — ABNORMAL HIGH (ref <117)

## 2014-12-28 ENCOUNTER — Other Ambulatory Visit: Payer: Self-pay | Admitting: Cardiology

## 2015-04-18 ENCOUNTER — Other Ambulatory Visit: Payer: Self-pay | Admitting: Cardiology

## 2015-05-27 ENCOUNTER — Other Ambulatory Visit (HOSPITAL_COMMUNITY): Payer: Self-pay | Admitting: Pulmonary Disease

## 2015-05-27 DIAGNOSIS — R945 Abnormal results of liver function studies: Secondary | ICD-10-CM

## 2015-06-01 ENCOUNTER — Ambulatory Visit (HOSPITAL_COMMUNITY)
Admission: RE | Admit: 2015-06-01 | Discharge: 2015-06-01 | Disposition: A | Discharge status: Home / Self Care | Payer: Medicaid Other | Source: Ambulatory Visit | Attending: Pulmonary Disease | Admitting: Pulmonary Disease

## 2015-06-01 ENCOUNTER — Ambulatory Visit (INDEPENDENT_AMBULATORY_CARE_PROVIDER_SITE_OTHER): Payer: Medicaid Other | Admitting: Cardiology

## 2015-06-01 ENCOUNTER — Encounter: Payer: Self-pay | Admitting: Cardiology

## 2015-06-01 VITALS — BP 132/78 | HR 86 | Ht 63.0 in | Wt 353.0 lb

## 2015-06-01 DIAGNOSIS — R0789 Other chest pain: Secondary | ICD-10-CM

## 2015-06-01 DIAGNOSIS — R7989 Other specified abnormal findings of blood chemistry: Secondary | ICD-10-CM | POA: Insufficient documentation

## 2015-06-01 DIAGNOSIS — R0600 Dyspnea, unspecified: Secondary | ICD-10-CM

## 2015-06-01 DIAGNOSIS — I4891 Unspecified atrial fibrillation: Secondary | ICD-10-CM

## 2015-06-01 DIAGNOSIS — I10 Essential (primary) hypertension: Secondary | ICD-10-CM

## 2015-06-01 DIAGNOSIS — R945 Abnormal results of liver function studies: Secondary | ICD-10-CM

## 2015-06-01 MED ORDER — METOPROLOL SUCCINATE ER 200 MG PO TB24: 200.0000 mg | ORAL_TABLET | Freq: Every day | ORAL | Status: DC

## 2015-06-01 NOTE — Patient Instructions (Signed)
Medication Instructions:  Increase toprol xl to 200 mg daily Start Zantac 150 mg two times daily   Labwork: I WILL REQUEST YOUR LAB WORK FROM YOUR PCP  Testing/Procedures: NONE  Follow-Up: Your physician recommends that you schedule a follow-up appointment in: 3 MONTHS    Any Other Special Instructions Will Be Listed Below (If Applicable).     If you need a refill on your cardiac medications before your next appointment, please call your pharmacy.

## 2015-06-01 NOTE — Progress Notes (Signed)
Patient ID: Jasmin Martin, female   DOB: 14-Dec-1955, 60 y.o.   MRN:DARNETTE LAMPRON     Clinical Summary Jasmin Martin is a 60 y.o.female seen today for follow up of the following medical problems.  1. Afib - reports fairly frequent palpitations since our last visit - denies any bleeding on elquiis  2. HTN - checks bp at home, typically 120s/70-80s - compliant with meds  3. Dyspnea - stable DOE - denies any recent LE edema. Taking torsemide on average every other day.   4. Nocturnal hypoxia - uses home O2 at night, reports negative sleep study for OSA  5. Chest pressure - started about 1 month ago, midchest. 5/10 in severity. Worst with laying down. No other associated symptoms. Not positional. Lasts for just a few seconds. Occurs 3-4 days a week. Reports increased belching/indigestion symptoms over the same time period.  - 06/2014 MPI negative.   Past Medical History  Diagnosis Date  . Persistent atrial fibrillation   . Obesity, morbid   . HTN (hypertension)   . DJD (degenerative joint disease)   . Arthritis   . Insomnia   . Depression   . Bilateral knee pain   . CHF (congestive heart failure)   . Renal insufficiency      No Known Allergies   Current Outpatient Prescriptions  Medication Sig Dispense Refill  . ALPRAZolam (XANAX) 1 MG tablet Take 1 mg by mouth as needed. Anxiety    . apixaban (ELIQUIS) 5 MG TABS tablet Take 5 mg by mouth 2 (two) times daily.    Marland Kitchen aspirin (ASPIRIN LOW DOSE) 81 MG EC tablet Take 81 mg by mouth daily.      Marland Kitchen diltiazem (CARDIZEM LA) 240 MG 24 hr tablet Take 240 mg by mouth daily.    Marland Kitchen lisinopril (PRINIVIL,ZESTRIL) 20 MG tablet TAKE ONE TABLET BY MOUTH ONCE DAILY. 30 tablet 1  . loperamide (IMODIUM) 2 MG capsule Take 2 mg by mouth 4 (four) times daily as needed. Diarrhea    . metoprolol succinate (TOPROL-XL) 100 MG 24 hr tablet TAKE (1) TABLET BY MOUTH EACH MORNING AND 1/2 TABLET IN THE EVENING. 45 tablet 1  . oxyCODONE-acetaminophen (PERCOCET)  10-325 MG per tablet Take 1 tablet by mouth every 6 (six) hours.    . potassium phosphate, monobasic, (K-PHOS ORIGINAL) 500 MG tablet Take 500 mg by mouth daily.    . pravastatin (PRAVACHOL) 40 MG tablet Take 40 mg by mouth daily.    Marland Kitchen torsemide (DEMADEX) 100 MG tablet Take only if you gain weight of 3 pounds or develop edema     No current facility-administered medications for this visit.     Past Surgical History  Procedure Laterality Date  . Bilateral knee surgery    . Vesicovaginal fistula closure w/ tah    . Tonsillectomy    . Right ankle surgery    . Cholecystectomy  2001  . Cesarean section    . Abdominal hysterectomy       No Known Allergies    Family History  Problem Relation Age of Onset  . Cancer Father     Lung      Social History Jasmin Martin reports that she has never smoked. Her smokeless tobacco use includes Chew. Jasmin Martin reports that she does not drink alcohol.   Review of Systems CONSTITUTIONAL: No weight loss, fever, chills, weakness or fatigue.  HEENT: Eyes: No visual loss, blurred vision, double vision or yellow sclerae.No hearing loss, sneezing, congestion, runny nose  or sore throat.  SKIN: No rash or itching.  CARDIOVASCULAR: per HPI RESPIRATORY: No shortness of breath, cough or sputum.  GASTROINTESTINAL: No anorexia, nausea, vomiting or diarrhea. No abdominal pain or blood.  GENITOURINARY: No burning on urination, no polyuria NEUROLOGICAL: No headache, dizziness, syncope, paralysis, ataxia, numbness or tingling in the extremities. No change in bowel or bladder control.  MUSCULOSKELETAL: No muscle, back pain, joint pain or stiffness.  LYMPHATICS: No enlarged nodes. No history of splenectomy.  PSYCHIATRIC: No history of depression or anxiety.  ENDOCRINOLOGIC: No reports of sweating, cold or heat intolerance. No polyuria or polydipsia.  Marland Kitchen   Physical Examination Filed Vitals:   06/01/15 1254  BP: 132/78  Pulse: 86   Filed Vitals:    06/01/15 1254  Height:  (1.6 m)  Weight: 353 lb (160.12 kg)    Gen: resting comfortably, no acute distress HEENT: no scleral icterus, pupils equal round and reactive, no palptable cervical adenopathy,  CV: RRR, no m/r/g, no jvd Resp: Clear to auscultation bilaterally GI: abdomen is soft, non-tender, non-distended, normal bowel sounds, no hepatosplenomegaly MSK: extremities are warm, no edema.  Skin: warm, no rash Neuro:  no focal deficits Psych: appropriate affect   Diagnostic Studies 07/14/11 Echo: LVEF 50-55%, mild LVH, mod LAE  06/2014 MPI IMPRESSION: 1. No reversible ischemia or infarction. Hypertensive response to stress.  2. Normal left ventricular wall motion.  3. Left ventricular ejection fraction 30%. However, this is likely due to gating abnormalities as echocardiogram on 06/17/14 reported normal LV systolic function, LVEF 55-60%. Function appears grossly normal on nuclear images.  4. Low-risk stress test findings*.    Assessment and Plan   1. Afib - will increase Toprol XL to  daily due to ongoing palpitations - continue eliquis for stroke prevention  2. HTN - at goal, we will continue current meds  3. Dyspnea - improved with more aggressive diuretics, continue torsemide  4. Chest pain - atypical, symptoms more suggestive of GI etiology. Negative Lexiscan 06/2014 - I have recommended a trial of OTC zantac.    F/u 3 months     Antoine Poche, M.D.

## 2015-06-08 ENCOUNTER — Encounter: Payer: Self-pay | Admitting: Internal Medicine

## 2015-06-17 ENCOUNTER — Other Ambulatory Visit: Payer: Self-pay | Admitting: Cardiology

## 2015-06-29 ENCOUNTER — Ambulatory Visit: Payer: Medicaid Other | Admitting: Gastroenterology

## 2015-07-22 ENCOUNTER — Ambulatory Visit (INDEPENDENT_AMBULATORY_CARE_PROVIDER_SITE_OTHER): Payer: Medicaid Other | Admitting: Gastroenterology

## 2015-07-22 ENCOUNTER — Encounter: Payer: Self-pay | Admitting: Gastroenterology

## 2015-07-22 VITALS — BP 139/86 | HR 103 | Temp 97.6°F | Ht 63.0 in | Wt 353.6 lb

## 2015-07-22 DIAGNOSIS — R7989 Other specified abnormal findings of blood chemistry: Secondary | ICD-10-CM | POA: Diagnosis not present

## 2015-07-22 DIAGNOSIS — R945 Abnormal results of liver function studies: Principal | ICD-10-CM

## 2015-07-22 LAB — IRON AND TIBC
%SAT: 32 % (ref 11–50)
Iron: 111 ug/dL (ref 45–160)
TIBC: 347 ug/dL (ref 250–450)
UIBC: 236 ug/dL (ref 125–400)

## 2015-07-22 LAB — FERRITIN: Ferritin: 185 ng/mL (ref 20–288)

## 2015-07-22 MED ORDER — PANTOPRAZOLE SODIUM 40 MG PO TBEC: 40.0000 mg | DELAYED_RELEASE_TABLET | Freq: Every day | ORAL | Status: DC

## 2015-07-22 NOTE — Patient Instructions (Addendum)
Please have blood work done today.   I have sent in a medication for reflux called Protonix. Take this first thing in the morning, 30 minutes before breakfast.   I will see you in 3 months!  Follow the low-fat diet attached.  Fat and Cholesterol Restricted Diet High levels of fat and cholesterol in your blood may lead to various health problems, such as diseases of the heart, blood vessels, gallbladder, liver, and pancreas. Fats are concentrated sources of energy that come in various forms. Certain types of fat, including saturated fat, may be harmful in excess. Cholesterol is a substance needed by your body in small amounts. Your body makes all the cholesterol it needs. Excess cholesterol comes from the food you eat. When you have high levels of cholesterol and saturated fat in your blood, health problems can develop because the excess fat and cholesterol will gather along the walls of your blood vessels, causing them to narrow. Choosing the right foods will help you control your intake of fat and cholesterol. This will help keep the levels of these substances in your blood within normal limits and reduce your risk of disease. WHAT IS MY PLAN? Your health care provider recommends that you:  Get no more than __________ % of the total calories in your daily diet from fat.  Limit your intake of saturated fat to less than ______% of your total calories each day.  Limit the amount of cholesterol in your diet to less than _________mg per day. WHAT TYPES OF FAT SHOULD I CHOOSE?  Choose healthy fats more often. Choose monounsaturated and polyunsaturated fats, such as olive and canola oil, flaxseeds, walnuts, almonds, and seeds.  Eat more omega-3 fats. Good choices include salmon, mackerel, sardines, tuna, flaxseed oil, and ground flaxseeds. Aim to eat fish at least two times a week.  Limit saturated fats. Saturated fats are primarily found in animal products, such as meats, butter, and cream. Plant  sources of saturated fats include palm oil, palm kernel oil, and coconut oil.  Avoid foods with partially hydrogenated oils in them. These contain trans fats. Examples of foods that contain trans fats are stick margarine, some tub margarines, cookies, crackers, and other baked goods. WHAT GENERAL GUIDELINES DO I NEED TO FOLLOW? These guidelines for healthy eating will help you control your intake of fat and cholesterol:  Check food labels carefully to identify foods with trans fats or high amounts of saturated fat.  Fill one half of your plate with vegetables and green salads.  Fill one fourth of your plate with whole grains. Look for the word "whole" as the first word in the ingredient list.  Fill one fourth of your plate with lean protein foods.  Limit fruit to two servings a day. Choose fruit instead of juice.  Eat more foods that contain soluble fiber. Examples of foods that contain this type of fiber are apples, broccoli, carrots, beans, peas, and barley. Aim to get 20-30 g of fiber per day.  Eat more home-cooked food and less restaurant, buffet, and fast food.  Limit or avoid alcohol.  Limit foods high in starch and sugar.  Limit fried foods.  Cook foods using methods other than frying. Baking, boiling, grilling, and broiling are all great options.  Lose weight if you are overweight. Losing just 5-10% of your initial body weight can help your overall health and prevent diseases such as diabetes and heart disease. WHAT FOODS CAN I EAT? Grains Whole grains, such as whole wheat or  whole grain breads, crackers, cereals, and pasta. Unsweetened oatmeal, bulgur, barley, quinoa, or brown rice. Corn or whole wheat flour tortillas. Vegetables Fresh or frozen vegetables (raw, steamed, roasted, or grilled). Green salads. Fruits All fresh, canned (in natural juice), or frozen fruits. Meat and Other Protein Products Ground beef (85% or leaner), grass-fed beef, or beef trimmed of fat.  Skinless chicken or [place]. Ground chicken or [place]. Pork trimmed of fat. All fish and seafood. Eggs. Dried beans, peas, or lentils. Unsalted nuts or seeds. Unsalted canned or dry beans. Dairy Low-fat dairy products, such as skim or 1% milk, 2% or reduced-fat cheeses, low-fat ricotta or cottage cheese, or plain low-fat yogurt. Fats and Oils Tub margarines without trans fats. Light or reduced-fat mayonnaise and salad dressings. Avocado. Olive, canola, sesame, or safflower oils. Natural peanut or almond butter (choose ones without added sugar and oil). The items listed above may not be a complete list of recommended foods or beverages. Contact your dietitian for more options. WHAT FOODS ARE NOT RECOMMENDED? Grains White bread. White pasta. White rice. Cornbread. Bagels, pastries, and croissants. Crackers that contain trans fat. Vegetables White potatoes. Corn. Creamed or fried vegetables. Vegetables in a cheese sauce. Fruits Dried fruits. Canned fruit in light or heavy syrup. Fruit juice. Meat and Other Protein Products Fatty cuts of meat. Ribs, chicken wings, bacon, sausage, bologna, salami, chitterlings, fatback, hot dogs, bratwurst, and packaged luncheon meats. Liver and organ meats. Dairy Whole or 2% milk, cream, half-and-half, and cream cheese. Whole milk cheeses. Whole-fat or sweetened yogurt. Full-fat cheeses. Nondairy creamers and whipped toppings. Processed cheese, cheese spreads, or cheese curds. Sweets and Desserts Corn syrup, sugars, honey, and molasses. Candy. Jam and jelly. Syrup. Sweetened cereals. Cookies, pies, cakes, donuts, muffins, and ice cream. Fats and Oils Butter, stick margarine, lard, shortening, ghee, or bacon fat. Coconut, palm kernel, or palm oils. Beverages Alcohol. Sweetened drinks (such as sodas, lemonade, and fruit drinks or punches). The items listed above may not be a complete list of foods and beverages to avoid. Contact your dietitian for more information.    This information is not intended to replace advice given to you by your health care provider. Make sure you discuss any questions you have with your health care provider.   Document Released: 03/19/2005 Document Revised: 04/09/2014 Document Reviewed: 06/17/2013 Elsevier Interactive Patient Education [employer]

## 2015-07-22 NOTE — Progress Notes (Signed)
Primary Care Physician:  Alonza Bogus, MD Primary Gastroenterologist:  Dr. Gala Romney   Chief Complaint  Patient presents with  . OTHER    fatty liver    HPI:   Jasmin Martin is a 60 y.o. female presenting today at the request of Dr. Luan Pulling secondary to elevated LFTs and fatty liver. [place] March 2017 noted probable fatty infiltration of liver. Feb 2017 LFTs with Alk Phos 134, AST 89, ALT 100, Platelets 248, Sodium 139, Hgb 13.7. I do not know a baseline LFTs, but LFTs are normal in 2013 in epic.   No abdominal pain. Stays nauseated but thinks it is related to pain medication. No vomiting. Has had worsening reflux for the past 3-4 weeks. Nocturnal reflux. Not on a PPI. No dysphagia. Has history of chronic diarrhea. Starts out as formed in the morning and turns watery. Takes Imodium on a regular basis. Doesn't take if she is at home, but she takes if she is going out. BMs on average  4-5 times a day. Starts in the early morning. Diarrhea present prior to cholecystectomy. States "since I can remember" I have had diarrhea. No rectal bleeding.   No confusion. Takes "fluid pills". Demadex prn. Has DDD, "bad knees and bad ankle" and on chronic pain medications. Uncle had liver problems but "he was a drinker". Patient denies chronic ETOH use or smoking.   Past Medical History  Diagnosis Date  . Persistent atrial fibrillation ([place])   . Obesity, morbid ([place])   . HTN (hypertension)   . DJD (degenerative joint disease)   . Arthritis   . Insomnia   . Depression   . Bilateral knee pain   . CHF (congestive heart failure) ([place])   . Renal insufficiency     Past Surgical History  Procedure Laterality Date  . Bilateral knee surgery    . Vesicovaginal fistula closure w/ tah    . Tonsillectomy    . Right ankle surgery    . Cholecystectomy  2001  . Cesarean section    . Abdominal hysterectomy    . Colonoscopy  2007    Dr. Gala Romney: friable anal canal, few scattered left-sided diverticula, benign  colonic biopsies     Current Outpatient Prescriptions  Medication Sig Dispense Refill  . ALPRAZolam (XANAX) 1 MG tablet Take 1 mg by mouth as needed. Anxiety    . apixaban (ELIQUIS) 5 MG TABS tablet Take 5 mg by mouth 2 (two) times daily.    Marland Kitchen aspirin (ASPIRIN LOW DOSE) 81 MG EC tablet Take 81 mg by mouth daily.      Marland Kitchen diltiazem (CARDIZEM LA) 240 MG 24 hr tablet Take 240 mg by mouth daily.    Marland Kitchen lisinopril (PRINIVIL,ZESTRIL) 20 MG tablet TAKE ONE TABLET BY MOUTH ONCE DAILY. 30 tablet 6  . loperamide (IMODIUM) 2 MG capsule Take 2 mg by mouth 4 (four) times daily as needed. Diarrhea    . metoprolol (TOPROL XL) 200 MG 24 hr tablet Take 1 tablet (200 mg total) by mouth daily. 90 tablet 3  . oxyCODONE-acetaminophen (PERCOCET) 10-325 MG per tablet Take 1 tablet by mouth every 6 (six) hours.    . potassium phosphate, monobasic, (K-PHOS ORIGINAL) 500 MG tablet Take 500 mg by mouth daily.    . pravastatin (PRAVACHOL) 40 MG tablet Take 40 mg by mouth daily.    Marland Kitchen torsemide (DEMADEX) 100 MG tablet Take only if you gain weight of 3 pounds or develop edema (Patient taking differently: 50 mg. Take  only if you gain weight of 3 pounds or develop edema)     No current facility-administered medications for this visit.    Allergies as of 07/22/2015  . (No Known Allergies)    Family History  Problem Relation Age of Onset  . Cancer Father     Lung   . Colon cancer Neg Hx     Social History   Social History  . Marital Status: Legally Separated    Spouse Name: N/A  . Number of Children: N/A  . Years of Education: N/A   Occupational History  . Not on file.   Social History Main Topics  . Smoking status: Never Smoker   . Smokeless tobacco: Current User    Types: Chew     Comment: Never smoked  . Alcohol Use: No  . Drug Use: No  . Sexual Activity: Yes    Birth Control/ Protection: Surgical   Other Topics Concern  . Not on file   Social History Narrative    Review of Systems: Gen:  Denies any fever, chills, fatigue, weight loss, lack of appetite.  CV: +palpitations Resp: +DOE GI: see HPI  GU : Denies urinary burning, urinary frequency, urinary hesitancy MS: +joint pain  Derm: Denies rash, itching, dry skin Psych: Denies depression, anxiety, memory loss, and confusion Heme: Denies bruising, bleeding, and enlarged lymph nodes.  Physical Exam: BP 139/86 mmHg  Pulse 103  Temp(Src) 97.6 F (36.4 C) (Oral)  Ht 5' 3"  (1.6 m)  Wt 353 lb 9.6 oz (160.392 kg)  BMI 62.65 kg/m2 General:   Alert and oriented. Pleasant and cooperative. Well-nourished and well-developed.  Head:  Normocephalic and atraumatic. Eyes:  Without icterus, sclera clear and conjunctiva pink.  Ears:  Normal auditory acuity. Nose:  No deformity, discharge,  or lesions. Mouth:  No deformity or lesions, oral mucosa pink.  Lungs:  Clear to auscultation bilaterally. No wheezes, rales, or rhonchi. No distress.  Heart:  S1, S2 present without murmurs appreciated.  Abdomen:  +BS, soft, obese, non-tender and non-distended. Difficult to appreciate HSM due to AP diameter. Rectal:  Deferred  Msk:  Symmetrical without gross deformities. Normal posture. Extremities:  Without  edema. Neurologic:  Alert and  oriented x4;  grossly normal neurologically. Psych:  Alert and cooperative. Normal mood and affect.  Outside labs Feb 2017 LFTs with Alk Phos 134, AST 89, ALT 100, Platelets 248, Sodium 139, Hgb 13.7

## 2015-07-23 LAB — HEPATITIS B SURFACE ANTIGEN: HEP B S AG: NEGATIVE

## 2015-07-23 LAB — HEPATITIS C ANTIBODY: HCV Ab: NEGATIVE

## 2015-07-25 LAB — ANA: Anti Nuclear Antibody(ANA): NEGATIVE

## 2015-07-25 LAB — CERULOPLASMIN: Ceruloplasmin: 24 mg/dL (ref 18–53)

## 2015-07-25 LAB — MITOCHONDRIAL ANTIBODIES

## 2015-07-25 NOTE — Assessment & Plan Note (Signed)
60 year old female with moderately elevated transaminases and fatty liver on ultrasound. Unknown baseline LFTs. In this scenario, query likely fatty liver as culprit; however, I have ordered extensive labs to rule out other etiologies (including viral markers, autoimmune/PBC, etc). Will hold off on elastography currently as this tends to "overcall", and she has recently had an ultrasound of her liver. We will have her follow a low fat diet, attempt weight loss, and recheck HFP in 3 months. At that time, could consider elastography. She is due for routine screening colonoscopy at some point this year. If needed, could pursue EGD at that time if elastography shows possible advanced fibrosis. Will start Protonix once daily due to intermittent reflux. 3 month return.

## 2015-07-26 LAB — ANTI-SMOOTH MUSCLE ANTIBODY, IGG: Smooth Muscle Ab: <20 U (ref <20)

## 2015-07-26 NOTE — Progress Notes (Signed)
cc'ed to pcp °

## 2015-08-02 NOTE — Progress Notes (Signed)
Quick Note:  All labs are normal. Likely dealing with fatty liver.  Return in 3 months. ______

## 2015-08-03 NOTE — Progress Notes (Signed)
PATIENT SCHEDULED FOR FU OV °

## 2015-08-08 ENCOUNTER — Encounter: Payer: Self-pay | Admitting: Cardiology

## 2015-09-13 ENCOUNTER — Ambulatory Visit: Payer: Medicaid Other | Admitting: Cardiology

## 2015-09-20 ENCOUNTER — Ambulatory Visit: Payer: Medicaid Other | Admitting: Cardiology

## 2015-10-14 ENCOUNTER — Ambulatory Visit (INDEPENDENT_AMBULATORY_CARE_PROVIDER_SITE_OTHER): Payer: Medicaid Other | Admitting: Cardiology

## 2015-10-14 ENCOUNTER — Encounter: Payer: Self-pay | Admitting: Cardiology

## 2015-10-14 VITALS — BP 128/70 | HR 74 | Ht 64.0 in | Wt 357.0 lb

## 2015-10-14 DIAGNOSIS — R0789 Other chest pain: Secondary | ICD-10-CM | POA: Diagnosis not present

## 2015-10-14 DIAGNOSIS — R0600 Dyspnea, unspecified: Secondary | ICD-10-CM

## 2015-10-14 DIAGNOSIS — I10 Essential (primary) hypertension: Secondary | ICD-10-CM

## 2015-10-14 DIAGNOSIS — I4891 Unspecified atrial fibrillation: Secondary | ICD-10-CM | POA: Diagnosis not present

## 2015-10-14 NOTE — Progress Notes (Addendum)
Clinical Summary [name] is a 60 y.o.female  Seen today for follow up of the following medical problem.s   1. Afib - palpitations improved since last visit after increasing Toprol - compliant with meds  2. HTN - checks bp at home, typically 120s/80s - compliant with meds  3. Dyspnea - stable DOE - denies any recent LE edema. Taking torsemide 50mg  as needed.   4. Nocturnal hypoxia - uses home O2 at night, reports negative sleep study for OSA  5. Chest pain - started about 1 month ago, midchest. 5/10 in severity. Worst with laying down. No other associated symptoms. Not positional. Lasts for just a few seconds. Occurs 3-4 days a week. Reports increased belching/indigestion symptoms over the same time period.  - 06/2014 MPI negative.  - no recurrent symptoms since last visit    Past Medical History  Diagnosis Date  . Persistent atrial fibrillation ([facility])   . Obesity, morbid ([facility])   . HTN (hypertension)   . DJD (degenerative joint disease)   . Arthritis   . Insomnia   . Depression   . Bilateral knee pain   . CHF (congestive heart failure) ([facility])   . Renal insufficiency      No Known Allergies   Current Outpatient Prescriptions  Medication Sig Dispense Refill  . ALPRAZolam (XANAX) 1 MG tablet Take 1 mg by mouth as needed. Anxiety    . apixaban (ELIQUIS) 5 MG TABS tablet Take 5 mg by mouth 2 (two) times daily.    Marland Kitchen. aspirin (ASPIRIN LOW DOSE) 81 MG EC tablet Take 81 mg by mouth daily.      Marland Kitchen. diltiazem (CARDIZEM LA) 240 MG 24 hr tablet Take 240 mg by mouth daily.    Marland Kitchen. lisinopril (PRINIVIL,ZESTRIL) 20 MG tablet TAKE ONE TABLET BY MOUTH ONCE DAILY. 30 tablet 6  . loperamide (IMODIUM) 2 MG capsule Take 2 mg by mouth 4 (four) times daily as needed. Diarrhea    . metoprolol (TOPROL XL) 200 MG 24 hr tablet Take 1 tablet (200 mg total) by mouth daily. 90 tablet 3  . oxyCODONE-acetaminophen (PERCOCET) 10-325 MG per tablet Take 1 tablet by mouth every 6 (six) hours.    .  pantoprazole (PROTONIX) 40 MG tablet Take 1 tablet (40 mg total) by mouth daily. 30 minutes before breakfast 90 tablet 3  . potassium phosphate, monobasic, (K-PHOS ORIGINAL) 500 MG tablet Take 500 mg by mouth daily.    . pravastatin (PRAVACHOL) 40 MG tablet Take 40 mg by mouth daily.    Marland Kitchen. torsemide (DEMADEX) 100 MG tablet Take only if you gain weight of 3 pounds or develop edema (Patient taking differently: 50 mg. Take only if you gain weight of 3 pounds or develop edema)     No current facility-administered medications for this visit.     Past Surgical History  Procedure Laterality Date  . Bilateral knee surgery    . Vesicovaginal fistula closure w/ tah    . Tonsillectomy    . Right ankle surgery    . Cholecystectomy  2001  . Cesarean section    . Abdominal hysterectomy    . Colonoscopy  2007    Dr. Jena Gaussourk: friable anal canal, few scattered left-sided diverticula, benign colonic biopsies      No Known Allergies    Family History  Problem Relation Age of Onset  . Cancer Father     Lung   . Colon cancer Neg Hx      Social  History Jasmin Martin reports that she has never smoked. Her smokeless tobacco use includes Chew. Jasmin Martin reports that she does not drink alcohol.   Review of Systems CONSTITUTIONAL: No weight loss, fever, chills, weakness or fatigue.  HEENT: Eyes: No visual loss, blurred vision, double vision or yellow sclerae.No hearing loss, sneezing, congestion, runny nose or sore throat.  SKIN: No rash or itching.  CARDIOVASCULAR: per HPI RESPIRATORY: No shortness of breath, cough or sputum.  GASTROINTESTINAL: No anorexia, nausea, vomiting or diarrhea. No abdominal pain or blood.  GENITOURINARY: No burning on urination, no polyuria NEUROLOGICAL: No headache, dizziness, syncope, paralysis, ataxia, numbness or tingling in the extremities. No change in bowel or bladder control.  MUSCULOSKELETAL: No muscle, back pain, joint pain or stiffness.  LYMPHATICS: No enlarged  nodes. No history of splenectomy.  PSYCHIATRIC: No history of depression or anxiety.  ENDOCRINOLOGIC: No reports of sweating, cold or heat intolerance. No polyuria or polydipsia.  Marland Kitchen   Physical Examination Filed Vitals:   10/14/15 1039  BP: 128/70  Pulse: 74   Filed Vitals:   10/14/15 1039  Height:  (1.626 m)  Weight: 357 lb (161.934 kg)    Gen: resting comfortably, no acute distress HEENT: no scleral icterus, pupils equal round and reactive, no palptable cervical adenopathy,  CV: RRR, no m/r/g, no jvd Resp: Clear to auscultation bilaterally GI: abdomen is soft, non-tender, non-distended, normal bowel sounds, no hepatosplenomegaly MSK: extremities are warm, no edema.  Skin: warm, no rash Neuro:  no focal deficits Psych: appropriate affect   Diagnostic Studies 07/14/11 Echo: LVEF 50-55%, mild LVH, mod LAE  06/2014 MPI IMPRESSION: 1. No reversible ischemia or infarction. Hypertensive response to stress.  2. Normal left ventricular wall motion.  3. Left ventricular ejection fraction 30%. However, this is likely due to gating abnormalities as echocardiogram on 06/17/14 reported normal LV systolic function, LVEF 55-60%. Function appears grossly normal on nuclear images.  4. Low-risk stress test findings*.  06/2014 echo Study Conclusions  - Left ventricle: Systolic function was normal. The estimated ejection fraction was in the range of 55% to 60%. - Aortic valve: Mildly calcified annulus. Trileaflet; mildly thickened leaflets. Valve area (VTI): 1.52 cm^2. - Mitral valve: Mildly calcified annulus. - Left atrium: The atrium was severely dilated. - Right ventricle: The cavity size was mildly dilated. - Right atrium: The atrium was severely dilated. - Atrial septum: There was a patent foramen ovale. - Technically adequate study.   Assessment and Plan   1. Afib -no recen tsymptoms, continue current meds. EKG in clnic shows rate controlled afib -  CHADS2Vasc score is 2, continue eliquis. Stop ASA  2. HTN - at goal, we will continue current meds  3. Dyspnea - improved with more aggressive diuretics, we will continue torsemide  4. Chest pain - atypical, symptoms more suggestive of GI etiology. Negative Lexiscan 06/2014 - no recurrence, continue to monitor   F/u 6 months     Antoine Poche, M.D.

## 2015-10-14 NOTE — Patient Instructions (Signed)
Your physician wants you to follow-up in: 6 months You will receive a reminder letter in the mail two months in advance. If you don't receive a letter, please call our office to schedule the follow-up appointment.    STOP ASPIRIN    Continue to take Torsemide at 50 mg daily only as needed      Thank you for choosing [facility] !

## 2015-10-26 ENCOUNTER — Ambulatory Visit: Payer: Medicaid Other | Admitting: Gastroenterology

## 2015-11-09 ENCOUNTER — Encounter: Payer: Self-pay | Admitting: Internal Medicine

## 2015-11-24 ENCOUNTER — Ambulatory Visit: Payer: Medicaid Other | Admitting: Gastroenterology

## 2015-12-07 ENCOUNTER — Encounter: Payer: Self-pay | Admitting: Internal Medicine

## 2015-12-07 ENCOUNTER — Ambulatory Visit (INDEPENDENT_AMBULATORY_CARE_PROVIDER_SITE_OTHER): Payer: Medicaid Other | Admitting: Gastroenterology

## 2015-12-07 ENCOUNTER — Encounter: Payer: Self-pay | Admitting: Gastroenterology

## 2015-12-07 VITALS — BP 172/82 | HR 73 | Temp 97.9°F | Ht 64.0 in | Wt 358.2 lb

## 2015-12-07 DIAGNOSIS — R7989 Other specified abnormal findings of blood chemistry: Secondary | ICD-10-CM

## 2015-12-07 DIAGNOSIS — R945 Abnormal results of liver function studies: Principal | ICD-10-CM

## 2015-12-07 LAB — HEPATIC FUNCTION PANEL
ALBUMIN: 3.9 g/dL (ref 3.6–5.1)
ALK PHOS: 83 U/L (ref 33–130)
ALT: 21 U/L (ref 6–29)
AST: 23 U/L (ref 10–35)
BILIRUBIN TOTAL: 0.6 mg/dL (ref 0.2–1.2)
Bilirubin, Direct: 0.1 mg/dL (ref <=0.2)
Indirect Bilirubin: 0.5 mg/dL (ref 0.2–1.2)
TOTAL PROTEIN: 6.8 g/dL (ref 6.1–8.1)

## 2015-12-07 NOTE — Patient Instructions (Signed)
Please have labs done today.  I have ordered a special ultrasound of your liver.   You will need a colonoscopy and possibly and upper endoscopy in the near future.   We will see you in January 2018 to arrange this.

## 2015-12-07 NOTE — Progress Notes (Signed)
Referring Provider: [name], MD Primary Care Physician:  Alonza Bogus, MD  Primary GI: Dr. Gala Romney   Chief Complaint  Patient presents with  . Follow-up    doing ok    HPI:   Jasmin Martin is a 60 y.o. female presenting today with a history of elevated LFTs, seen in April 2017 with labs from Feb 2017 noting Alk Phos 134, AST 89, ALT 100, Platelets 248, Sodium 139, Hgb 13.7. History of reflux, started on Protonix at last visit. Extensive serologies ordered and normal, likely dealing with fatty liver. Hep C, Hep B negative. Due for repeat LFTs now. Due for routine 10 year screening colonoscopy. If elevated LFTs persist, will pursue elastography with possible EGD if evidence of advanced fibrosis.   No rectal bleeding. No constipation. Has had diarrhea since she was in her 58s and takes Imodium AD. Has gotten better lately. No abdominal pain. Has indigestion occasionally, taking Protonix each morning. Banana sets off reflux. No dysphagia. Eating habits are the same, states she is a stress eater. Has had significant traumatic issues as a child and adult but has excellent support team both with friends and at church.   Past Medical History:  Diagnosis Date  . Arthritis   . Bilateral knee pain   . CHF (congestive heart failure) ([place])   . Depression   . DJD (degenerative joint disease)   . HTN (hypertension)   . Insomnia   . Obesity, morbid ([place])   . Persistent atrial fibrillation ([place])   . Renal insufficiency     Past Surgical History:  Procedure Laterality Date  . ABDOMINAL HYSTERECTOMY    . bilateral knee surgery    . CESAREAN SECTION    . CHOLECYSTECTOMY  2001  . COLONOSCOPY  2007   Dr. Gala Romney: friable anal canal, few scattered left-sided diverticula, benign colonic biopsies   . right ankle surgery    . TONSILLECTOMY    . VESICOVAGINAL FISTULA CLOSURE W/ TAH      Current Outpatient Prescriptions  Medication Sig Dispense Refill  . ALPRAZolam (XANAX) 1 MG tablet  Take 1 mg by mouth as needed. Anxiety    . apixaban (ELIQUIS) 5 MG TABS tablet Take 5 mg by mouth 2 (two) times daily.    Marland Kitchen diltiazem (CARDIZEM LA) 240 MG 24 hr tablet Take 240 mg by mouth daily.    Marland Kitchen lisinopril (PRINIVIL,ZESTRIL) 20 MG tablet TAKE ONE TABLET BY MOUTH ONCE DAILY. 30 tablet 6  . loperamide (IMODIUM) 2 MG capsule Take 2 mg by mouth 4 (four) times daily as needed. Diarrhea    . metoprolol (TOPROL XL) 200 MG 24 hr tablet Take 1 tablet (200 mg total) by mouth daily. 90 tablet 3  . oxyCODONE-acetaminophen (PERCOCET) 10-325 MG per tablet Take 1 tablet by mouth every 6 (six) hours.    . pantoprazole (PROTONIX) 40 MG tablet Take 1 tablet (40 mg total) by mouth daily. 30 minutes before breakfast 90 tablet 3  . potassium phosphate, monobasic, (K-PHOS ORIGINAL) 500 MG tablet Take 500 mg by mouth daily.    . pravastatin (PRAVACHOL) 40 MG tablet Take 40 mg by mouth daily.    Marland Kitchen torsemide (DEMADEX) 100 MG tablet Take only if you gain weight of 3 pounds or develop edema (Patient taking differently: 50 mg. Take only if you gain weight of 3 pounds or develop edema)     No current facility-administered medications for this visit.     Allergies as of 12/07/2015  . (  No Known Allergies)    Family History  Problem Relation Age of Onset  . Cancer Father     Lung   . Colon cancer Neg Hx     Social History   Social History  . Marital status: Divorced    Spouse name: N/A  . Number of children: N/A  . Years of education: N/A   Social History Main Topics  . Smoking status: Never Smoker  . Smokeless tobacco: Current User    Types: Chew     Comment: Never smoked  . Alcohol use No  . Drug use: No  . Sexual activity: Yes    Birth control/ protection: Surgical   Other Topics Concern  . None   Social History Narrative  . None    Review of Systems: As mentioned in HPI   Physical Exam: BP (!) 172/82   Pulse 73   Temp 97.9 F (36.6 C) (Oral)   Ht 5' 4"  (1.626 m)   Wt (!) 358 lb  3.2 oz (162.5 kg)   BMI 61.48 kg/m  General:   Alert and oriented. No distress noted. Pleasant and cooperative.  Head:  Normocephalic and atraumatic. Eyes:  Conjuctiva clear without scleral icterus. Abdomen:  +BS, soft, non-tender and non-distended. Obese, difficult to appreciate HSM. Query umbilical hernia.  Msk:  Symmetrical without gross deformities. Normal posture. Extremities:  Pedal edema, 1+ lower extremity edema  Neurologic:  Alert and  oriented x4;  grossly normal neurologically. Psych:  Alert and cooperative. Normal mood and affect.

## 2015-12-07 NOTE — Progress Notes (Signed)
cc'ed to pcp °

## 2015-12-07 NOTE — Assessment & Plan Note (Signed)
Likely secondary to fatty liver. Extensive serologies on file. US with fatty liver. Continues to gain weight. Will recheck HFP now, proceed with elastography. May ultimately need EGD if advanced fibrosis/cirrhosis. As of note, needs routine colonoscopy for screening purposes. Patient desires to wait until after Christmas. Return in Jan 2018 to arrange colonoscopy +/- EGD. Discussed weight loss, dietary modification. Patient states understanding.

## 2015-12-13 ENCOUNTER — Ambulatory Visit (HOSPITAL_COMMUNITY)
Admission: RE | Admit: 2015-12-13 | Discharge: 2015-12-13 | Disposition: A | Discharge status: Home / Self Care | Payer: Medicaid Other | Source: Ambulatory Visit | Attending: Gastroenterology | Admitting: Gastroenterology

## 2015-12-13 DIAGNOSIS — K760 Fatty (change of) liver, not elsewhere classified: Secondary | ICD-10-CM | POA: Insufficient documentation

## 2015-12-13 DIAGNOSIS — R945 Abnormal results of liver function studies: Secondary | ICD-10-CM

## 2015-12-13 DIAGNOSIS — Z9049 Acquired absence of other specified parts of digestive tract: Secondary | ICD-10-CM | POA: Insufficient documentation

## 2015-12-13 DIAGNOSIS — R7989 Other specified abnormal findings of blood chemistry: Secondary | ICD-10-CM | POA: Diagnosis not present

## 2015-12-14 NOTE — Progress Notes (Signed)
Your liver numbers are completely normal!!! Your elastography shows a score of 3 and 4, which means some stiffening of the liver and possibly early scarring. I recommend considering an upper endoscopy at time of colonoscopy. We will talk about this at your next appointment and follow your liver closely with ultrasounds. For now, continue low fat diet, exercise, and weight loss. I will see you at your next appointment!

## 2016-01-26 ENCOUNTER — Other Ambulatory Visit: Payer: Self-pay | Admitting: Cardiology

## 2016-04-10 ENCOUNTER — Ambulatory Visit: Payer: Medicaid Other | Admitting: Gastroenterology

## 2016-05-04 ENCOUNTER — Ambulatory Visit: Payer: Medicaid Other | Admitting: Gastroenterology

## 2016-05-11 ENCOUNTER — Other Ambulatory Visit (HOSPITAL_COMMUNITY): Payer: Self-pay | Admitting: Pulmonary Disease

## 2016-05-11 ENCOUNTER — Ambulatory Visit (HOSPITAL_COMMUNITY)
Admission: RE | Admit: 2016-05-11 | Discharge: 2016-05-11 | Disposition: A | Discharge status: Home / Self Care | Payer: Medicaid Other | Source: Ambulatory Visit | Attending: Pulmonary Disease | Admitting: Pulmonary Disease

## 2016-05-11 DIAGNOSIS — M25551 Pain in right hip: Secondary | ICD-10-CM

## 2016-06-04 ENCOUNTER — Other Ambulatory Visit: Payer: Self-pay | Admitting: Cardiology

## 2016-06-12 ENCOUNTER — Encounter: Payer: Self-pay | Admitting: Orthopedic Surgery

## 2016-06-12 ENCOUNTER — Ambulatory Visit (INDEPENDENT_AMBULATORY_CARE_PROVIDER_SITE_OTHER): Payer: Medicaid Other | Admitting: Orthopedic Surgery

## 2016-06-12 VITALS — BP 135/81 | HR 115 | Ht 63.0 in | Wt 370.0 lb

## 2016-06-12 DIAGNOSIS — M5136 Other intervertebral disc degeneration, lumbar region: Secondary | ICD-10-CM

## 2016-06-12 DIAGNOSIS — M160 Bilateral primary osteoarthritis of hip: Secondary | ICD-10-CM | POA: Diagnosis not present

## 2016-06-12 NOTE — Progress Notes (Signed)
Patient ID: Jasmin Martin, female   DOB: 02/15/1956, 61 y.o.   MRN: [id number]  Chief Complaint  Patient presents with  . Hip Pain    right hip pain    HPI Jasmin Martin is a 61 y.o. female. Presents for evaluation of right hip pain  She has been "down" for several weeks now with pain in her right groin in her right lower back and right lateral hip  She has a history of degenerative disc disease. She's recently had atrial fibrillation congestive heart failure  She denies numbness but does report weakness in her right leg Review of Systems Review of Systems  Constitutional: Negative for fever and unexpected weight change.  Gastrointestinal: Negative.   Genitourinary: Negative.   Neurological: Negative for numbness.   (2 MINIMUM)  Past Medical History:  Diagnosis Date  . Arthritis   . Bilateral knee pain   . CHF (congestive heart failure) (HCC)   . Depression   . DJD (degenerative joint disease)   . HTN (hypertension)   . Insomnia   . Obesity, morbid (HCC)   . Persistent atrial fibrillation (HCC)   . Renal insufficiency     Past Surgical History:  Procedure Laterality Date  . ABDOMINAL HYSTERECTOMY    . bilateral knee surgery    . CESAREAN SECTION    . CHOLECYSTECTOMY  2001  . COLONOSCOPY  2007   Dr. Jena Gaussourk: friable anal canal, few scattered left-sided diverticula, benign colonic biopsies   . right ankle surgery    . TONSILLECTOMY    . VESICOVAGINAL FISTULA CLOSURE W/ TAH      Social History Social History  Substance Use Topics  . Smoking status: Never Smoker  . Smokeless tobacco: Current User    Types: Chew     Comment: Never smoked  . Alcohol use No    No Known Allergies  Current Meds  Medication Sig  . ALPRAZolam (XANAX) 1 MG tablet Take 1 mg by mouth as needed. Anxiety  . apixaban (ELIQUIS) 5 MG TABS tablet Take 5 mg by mouth 2 (two) times daily.  Marland Kitchen. diltiazem (CARDIZEM LA) 240 MG 24 hr tablet Take 240 mg by mouth daily.  Marland Kitchen. lisinopril  (PRINIVIL,ZESTRIL) 20 MG tablet TAKE ONE TABLET BY MOUTH ONCE DAILY.  . metoprolol (TOPROL-XL) 200 MG 24 hr tablet TAKE ONE TABLET BY MOUTH ONCE DAILY.  Marland Kitchen. oxyCODONE-acetaminophen (PERCOCET) 10-325 MG per tablet Take 1 tablet by mouth every 6 (six) hours.  . pantoprazole (PROTONIX) 40 MG tablet Take 1 tablet (40 mg total) by mouth daily. 30 minutes before breakfast  . potassium gluconate 595 (99 K) MG TABS tablet Take 595 mg by mouth.  . pravastatin (PRAVACHOL) 40 MG tablet Take 40 mg by mouth daily.  Marland Kitchen. torsemide (DEMADEX) 100 MG tablet Take only if you gain weight of 3 pounds or develop edema (Patient taking differently: 50 mg. Take only if you gain weight of 3 pounds or develop edema)      Physical Exam Physical Exam 1.BP 135/81   Pulse (!) 115   Ht 5\' 3"  (1.6 m)   Wt (!) 370 lb (167.8 kg)   BMI 65.54 kg/m   2. Gen. appearance. The patient is well-developed and well-nourished, grooming and hygiene are normal. There are no gross congenital abnormalities, Severe obesity  3. The patient is alert and oriented to person place and time  4. Mood and affect are normal  5. Ambulation ambulatory with a cane  Examination reveals the following: 6.  On inspection we find mild groin pain with hip flexion but primarily tenderness in her lower back L4-5 L5-S1 right buttock right lateral hip right iliac crest 7. With the range of motion of  normal flexion in both hips with mild internal rotation pain bilaterally right greater than left   8. Stability tests were normal  both hips  9. Strength tests revealed grade 5 motor strength in both legs  10. Skin we find no rash ulceration or erythema normal back in both legs  11. Sensation remains intact both lower extremities  12 Vascular system shows mild peripheral edema in each ankle    MEDICAL DECISION MAKING:    Data Reviewed Plain films of the pelvis and right hip show minimal degenerative changes in both hips mild joint space narrowing,  lower segment of lumbosacral spine show severe degenerative arthritis and perhaps a spondylolysis  Assessment Encounter Diagnoses  Name Primary?  . DDD (degenerative disc disease), lumbar Yes  . Bilateral hip joint arthritis      Plan Recommend physical therapy for 6 weeks  Pain is primarily related to lumbar spine  Fuller Canada 06/12/2016, 11:29 AM

## 2016-06-20 ENCOUNTER — Ambulatory Visit (HOSPITAL_COMMUNITY): Payer: Medicaid Other | Admitting: Physical Therapy

## 2016-07-01 DIAGNOSIS — J449 Chronic obstructive pulmonary disease, unspecified: Secondary | ICD-10-CM | POA: Diagnosis not present

## 2016-07-09 ENCOUNTER — Other Ambulatory Visit: Payer: Self-pay | Admitting: Cardiology

## 2016-07-31 DIAGNOSIS — J449 Chronic obstructive pulmonary disease, unspecified: Secondary | ICD-10-CM | POA: Diagnosis not present

## 2016-08-07 DIAGNOSIS — I110 Hypertensive heart disease with heart failure: Secondary | ICD-10-CM | POA: Diagnosis not present

## 2016-08-07 DIAGNOSIS — J449 Chronic obstructive pulmonary disease, unspecified: Secondary | ICD-10-CM | POA: Diagnosis not present

## 2016-08-07 DIAGNOSIS — M545 Low back pain: Secondary | ICD-10-CM | POA: Diagnosis not present

## 2016-08-07 DIAGNOSIS — I482 Chronic atrial fibrillation: Secondary | ICD-10-CM | POA: Diagnosis not present

## 2016-08-08 ENCOUNTER — Other Ambulatory Visit: Payer: Self-pay | Admitting: Gastroenterology

## 2016-08-08 ENCOUNTER — Other Ambulatory Visit: Payer: Self-pay | Admitting: Cardiology

## 2016-08-31 DIAGNOSIS — J449 Chronic obstructive pulmonary disease, unspecified: Secondary | ICD-10-CM | POA: Diagnosis not present

## 2016-09-05 ENCOUNTER — Other Ambulatory Visit (HOSPITAL_COMMUNITY): Payer: Self-pay | Admitting: Pulmonary Disease

## 2016-09-05 ENCOUNTER — Ambulatory Visit (HOSPITAL_COMMUNITY)
Admission: RE | Admit: 2016-09-05 | Discharge: 2016-09-05 | Disposition: A | Discharge status: Home / Self Care | Payer: BLUE CROSS/BLUE SHIELD | Source: Ambulatory Visit | Attending: Pulmonary Disease | Admitting: Pulmonary Disease

## 2016-09-05 DIAGNOSIS — J441 Chronic obstructive pulmonary disease with (acute) exacerbation: Secondary | ICD-10-CM

## 2016-09-05 DIAGNOSIS — I517 Cardiomegaly: Secondary | ICD-10-CM | POA: Diagnosis not present

## 2016-09-05 DIAGNOSIS — R0602 Shortness of breath: Secondary | ICD-10-CM | POA: Diagnosis not present

## 2016-09-05 DIAGNOSIS — R05 Cough: Secondary | ICD-10-CM | POA: Diagnosis not present

## 2016-09-05 DIAGNOSIS — I110 Hypertensive heart disease with heart failure: Secondary | ICD-10-CM | POA: Diagnosis not present

## 2016-09-05 DIAGNOSIS — I482 Chronic atrial fibrillation: Secondary | ICD-10-CM | POA: Diagnosis not present

## 2016-09-05 DIAGNOSIS — J9611 Chronic respiratory failure with hypoxia: Secondary | ICD-10-CM | POA: Diagnosis not present

## 2016-09-05 DIAGNOSIS — R079 Chest pain, unspecified: Secondary | ICD-10-CM | POA: Diagnosis not present

## 2016-09-10 ENCOUNTER — Other Ambulatory Visit: Payer: Self-pay | Admitting: Cardiology

## 2016-09-20 DIAGNOSIS — X32XXXA Exposure to sunlight, initial encounter: Secondary | ICD-10-CM | POA: Diagnosis not present

## 2016-09-20 DIAGNOSIS — L708 Other acne: Secondary | ICD-10-CM | POA: Diagnosis not present

## 2016-09-20 DIAGNOSIS — D485 Neoplasm of uncertain behavior of skin: Secondary | ICD-10-CM | POA: Diagnosis not present

## 2016-09-20 DIAGNOSIS — L570 Actinic keratosis: Secondary | ICD-10-CM | POA: Diagnosis not present

## 2016-09-20 DIAGNOSIS — D225 Melanocytic nevi of trunk: Secondary | ICD-10-CM | POA: Diagnosis not present

## 2016-09-30 DIAGNOSIS — J449 Chronic obstructive pulmonary disease, unspecified: Secondary | ICD-10-CM | POA: Diagnosis not present

## 2016-10-13 ENCOUNTER — Other Ambulatory Visit: Payer: Self-pay | Admitting: Cardiology

## 2016-10-31 DIAGNOSIS — J449 Chronic obstructive pulmonary disease, unspecified: Secondary | ICD-10-CM | POA: Diagnosis not present

## 2016-11-01 ENCOUNTER — Other Ambulatory Visit: Payer: Self-pay | Admitting: Cardiology

## 2016-11-07 DIAGNOSIS — J9611 Chronic respiratory failure with hypoxia: Secondary | ICD-10-CM | POA: Diagnosis not present

## 2016-11-07 DIAGNOSIS — M545 Low back pain: Secondary | ICD-10-CM | POA: Diagnosis not present

## 2016-11-07 DIAGNOSIS — J449 Chronic obstructive pulmonary disease, unspecified: Secondary | ICD-10-CM | POA: Diagnosis not present

## 2016-11-07 DIAGNOSIS — I482 Chronic atrial fibrillation: Secondary | ICD-10-CM | POA: Diagnosis not present

## 2016-11-15 ENCOUNTER — Other Ambulatory Visit: Payer: Self-pay | Admitting: Cardiology

## 2016-11-29 ENCOUNTER — Other Ambulatory Visit: Payer: Self-pay | Admitting: Cardiology

## 2016-12-01 DIAGNOSIS — J449 Chronic obstructive pulmonary disease, unspecified: Secondary | ICD-10-CM | POA: Diagnosis not present

## 2016-12-10 ENCOUNTER — Ambulatory Visit (INDEPENDENT_AMBULATORY_CARE_PROVIDER_SITE_OTHER): Payer: BLUE CROSS/BLUE SHIELD | Admitting: Cardiology

## 2016-12-10 ENCOUNTER — Encounter: Payer: Self-pay | Admitting: Cardiology

## 2016-12-10 VITALS — BP 118/80 | HR 90 | Ht 64.0 in | Wt 360.0 lb

## 2016-12-10 DIAGNOSIS — I4891 Unspecified atrial fibrillation: Secondary | ICD-10-CM | POA: Diagnosis not present

## 2016-12-10 DIAGNOSIS — R0789 Other chest pain: Secondary | ICD-10-CM

## 2016-12-10 DIAGNOSIS — R600 Localized edema: Secondary | ICD-10-CM

## 2016-12-10 DIAGNOSIS — I10 Essential (primary) hypertension: Secondary | ICD-10-CM | POA: Diagnosis not present

## 2016-12-10 NOTE — Patient Instructions (Signed)
Medication Instructions:  Your physician recommends that you continue on your current medications as directed. Please refer to the Current Medication list given to you today.   Labwork: NONE  Testing/Procedures: NONE  Follow-Up: Your physician wants you to follow-up in: 6 MONTHS.  You will receive a reminder letter in the mail two months in advance. If you don't receive a letter, please call our office to schedule the follow-up appointment.   Any Other Special Instructions Will Be Listed Below: You have been given a prescription for compression stockings.    If you need a refill on your cardiac medications before your next appointment, please call your pharmacy.

## 2016-12-10 NOTE — Progress Notes (Signed)
Clinical Summary Jasmin Martin is a 61 y.o.female Seen today for follow up of the following medical problem.s   1. Afib - occasional palpitations, fairly rare and mild. No bleeding on eliquis  2. HTN - typically around 120s/80s at home.  - compliant with meds  3. Nocturnal hypoxia - uses home O2 at night, reports negative sleep study for OSA  4. Chest pain - started about 1 month ago, midchest. 5/10 in severity. Worst with laying down. No other associated symptoms. Not positional. Lasts for just a few seconds. Occurs 3-4 days a week. Reports increased belching/indigestion symptoms over the same time period.  - 06/2014 MPI negative.   - denies any recent symptoms  5. LE edema - controlled with torasemide - has labs coming up with Dr Jasmin Martin next month  Past Medical History:  Diagnosis Date  . Arthritis   . Bilateral knee pain   . CHF (congestive heart failure) (HCC)   . Depression   . DJD (degenerative joint disease)   . HTN (hypertension)   . Insomnia   . Obesity, morbid (HCC)   . Persistent atrial fibrillation (HCC)   . Renal insufficiency      No Known Allergies   Current Outpatient Prescriptions  Medication Sig Dispense Refill  . ALPRAZolam (XANAX) 1 MG tablet Take 1 mg by mouth as needed. Anxiety    . apixaban (ELIQUIS) 5 MG TABS tablet Take 5 mg by mouth 2 (two) times daily.    Marland Kitchen diltiazem (CARDIZEM LA) 240 MG 24 hr tablet Take 240 mg by mouth daily.    Marland Kitchen lisinopril (PRINIVIL,ZESTRIL) 20 MG tablet TAKE ONE TABLET BY MOUTH ONCE DAILY. 30 tablet 0  . metoprolol (TOPROL-XL) 200 MG 24 hr tablet TAKE (1) TABLET BY MOUTH ONCE DAILY. 15 tablet 0  . oxyCODONE-acetaminophen (PERCOCET) 10-325 MG per tablet Take 1 tablet by mouth every 6 (six) hours.    . pantoprazole (PROTONIX) 40 MG tablet TAKE 1 TABLET BY MOUTH 30 MINUTES BEFORE BREAKFAST. 30 tablet 11  . potassium gluconate 595 (99 K) MG TABS tablet Take 595 mg by mouth.    . pravastatin (PRAVACHOL) 40 MG  tablet Take 40 mg by mouth daily.    Marland Kitchen torsemide (DEMADEX) 100 MG tablet Take only if you gain weight of 3 pounds or develop edema (Patient taking differently: 50 mg. Take only if you gain weight of 3 pounds or develop edema)     No current facility-administered medications for this visit.      Past Surgical History:  Procedure Laterality Date  . ABDOMINAL HYSTERECTOMY    . bilateral knee surgery    . CESAREAN SECTION    . CHOLECYSTECTOMY  2001  . COLONOSCOPY  2007   Dr. Jena Gauss: friable anal canal, few scattered left-sided diverticula, benign colonic biopsies   . right ankle surgery    . TONSILLECTOMY    . VESICOVAGINAL FISTULA CLOSURE W/ TAH       No Known Allergies    Family History  Problem Relation Age of Onset  . Cancer Father        Lung   . Colon cancer Neg Hx      Social History Jasmin Martin reports that she has never smoked. Her smokeless tobacco use includes Chew. Jasmin Martin reports that she does not drink alcohol.   Review of Systems CONSTITUTIONAL: No weight loss, fever, chills, weakness or fatigue.  HEENT: Eyes: No visual loss, blurred vision, double vision or yellow sclerae.No hearing  loss, sneezing, congestion, runny nose or sore throat.  SKIN: No rash or itching.  CARDIOVASCULAR: per hpi RESPIRATORY: No shortness of breath, cough or sputum.  GASTROINTESTINAL: No anorexia, nausea, vomiting or diarrhea. No abdominal pain or blood.  GENITOURINARY: No burning on urination, no polyuria NEUROLOGICAL: No headache, dizziness, syncope, paralysis, ataxia, numbness or tingling in the extremities. No change in bowel or bladder control.  MUSCULOSKELETAL: No muscle, back pain, joint pain or stiffness.  LYMPHATICS: No enlarged nodes. No history of splenectomy.  PSYCHIATRIC: No history of depression or anxiety.  ENDOCRINOLOGIC: No reports of sweating, cold or heat intolerance. No polyuria or polydipsia.  Marland Kitchen.   Physical Examination Vitals:   12/10/16 1038  BP:  118/80  Pulse: 90  SpO2: 95%   Vitals:   12/10/16 1038  Weight: (!) 360 lb (163.3 kg)  Height: 5\' 4"  (1.626 m)    Gen: resting comfortably, no acute distress HEENT: no scleral icterus, pupils equal round and reactive, no palptable cervical adenopathy,  CV: RRR, no m/r/g, no jvd Resp: Clear to auscultation bilaterally GI: abdomen is soft, non-tender, non-distended, normal bowel sounds, no hepatosplenomegaly MSK: extremities are warm, trace bilateral LE edema  Skin: warm, no rash Neuro:  no focal deficits Psych: appropriate affect   Diagnostic Studies 07/14/11 Echo: LVEF 50-55%, mild LVH, mod LAE  06/2014 MPI IMPRESSION: 1. No reversible ischemia or infarction. Hypertensive response to stress.  2. Normal left ventricular wall motion.  3. Left ventricular ejection fraction 30%. However, this is likely due to gating abnormalities as echocardiogram on 06/17/14 reported normal LV systolic function, LVEF 55-60%. Function appears grossly normal on nuclear images.  4. Low-risk stress test findings*.  06/2014 echo Study Conclusions  - Left ventricle: Systolic function was normal. The estimated ejection fraction was in the range of 55% to 60%. - Aortic valve: Mildly calcified annulus. Trileaflet; mildly thickened leaflets. Valve area (VTI): 1.52 cm^2. - Mitral valve: Mildly calcified annulus. - Left atrium: The atrium was severely dilated. - Right ventricle: The cavity size was mildly dilated. - Right atrium: The atrium was severely dilated. - Atrial septum: There was a patent foramen ovale. - Technically adequate study.    Assessment and Plan  1. Afib -overall controlled, continue current meds - CHADS2Vasc score is 2, continue eliquis.  2. HTN - her bp is at goal, shee will continue current meds  3. Chest pain - atypical, symptoms more suggestive of GI etiology. Negative Lexiscan 06/2014 - we will continue to monitor at this time  4. LE edema - will give  Rx for compression stockings, continue diuretic.    F/u 6 months      Jasmin Martin, M.D.

## 2016-12-14 ENCOUNTER — Other Ambulatory Visit: Payer: Self-pay | Admitting: Cardiology

## 2016-12-31 DIAGNOSIS — J449 Chronic obstructive pulmonary disease, unspecified: Secondary | ICD-10-CM | POA: Diagnosis not present

## 2017-01-22 DIAGNOSIS — H04203 Unspecified epiphora, bilateral lacrimal glands: Secondary | ICD-10-CM | POA: Diagnosis not present

## 2017-01-31 DIAGNOSIS — Z79891 Long term (current) use of opiate analgesic: Secondary | ICD-10-CM | POA: Diagnosis not present

## 2017-01-31 DIAGNOSIS — J449 Chronic obstructive pulmonary disease, unspecified: Secondary | ICD-10-CM | POA: Diagnosis not present

## 2017-02-11 DIAGNOSIS — J9611 Chronic respiratory failure with hypoxia: Secondary | ICD-10-CM | POA: Diagnosis not present

## 2017-02-11 DIAGNOSIS — Z23 Encounter for immunization: Secondary | ICD-10-CM | POA: Diagnosis not present

## 2017-02-11 DIAGNOSIS — J449 Chronic obstructive pulmonary disease, unspecified: Secondary | ICD-10-CM | POA: Diagnosis not present

## 2017-02-11 DIAGNOSIS — I110 Hypertensive heart disease with heart failure: Secondary | ICD-10-CM | POA: Diagnosis not present

## 2017-02-11 DIAGNOSIS — M545 Low back pain: Secondary | ICD-10-CM | POA: Diagnosis not present

## 2017-03-02 DIAGNOSIS — J449 Chronic obstructive pulmonary disease, unspecified: Secondary | ICD-10-CM | POA: Diagnosis not present

## 2017-04-02 DIAGNOSIS — J449 Chronic obstructive pulmonary disease, unspecified: Secondary | ICD-10-CM | POA: Diagnosis not present

## 2017-04-15 DIAGNOSIS — Z634 Disappearance and death of family member: Secondary | ICD-10-CM | POA: Diagnosis not present

## 2017-04-15 DIAGNOSIS — I482 Chronic atrial fibrillation: Secondary | ICD-10-CM | POA: Diagnosis not present

## 2017-04-15 DIAGNOSIS — J449 Chronic obstructive pulmonary disease, unspecified: Secondary | ICD-10-CM | POA: Diagnosis not present

## 2017-04-15 DIAGNOSIS — M545 Low back pain: Secondary | ICD-10-CM | POA: Diagnosis not present

## 2017-04-24 NOTE — Progress Notes (Signed)
Cardiology Office Note    Date:  04/25/2017   ID:  Jasmin, Martin Jan 20, 1956, MRN [id number]  PCP:  [name], MD  Cardiologist: Dr. Wyline Mood   Chief Complaint  Patient presents with  . Follow-up    Worsening Palpitations    History of Present Illness:    Jasmin Martin is a 62 y.o. female with past medical history of persistent atrial fibrillation (on Eliquis), chronic diastolic CHF, HTN, and noctural hypoxia (uses O2 at night) who presents to the office today for evaluation of palpitations.  She was last examined by Dr. Wyline Mood in 12/2016 and reported only mild palpitations at that time. Was having occasional chest discomfort which would occur with lying down and lasting for a few seconds at a time. She reported more belching, therefore her symptoms were thought to be atypical for a cardiac etiology and she was continued on her current medication regimen.   In talking with the patient today, she reports her son passed away unexpectedly from a stroke in 01/2017 and since then she has been very anxious and upset, noticing her atrial fibrillation is "acting up". She notes the palpitations occur intermittently throughout the day and can last for 20-40 minutes at a time. Does experience associated dyspnea and dizziness. No chest pain or dyspnea on exertion.   She has been taking Torsemide more regularly, up to 4 times per week due to edema. She denies any orthopnea. Sleeps with 2L [place] at night.   Reports good compliance with her medication regimen, including Eliquis. Denies any evidence of active bleeding.   Past Medical History:  Diagnosis Date  . Arthritis   . Atrial fibrillation (HCC)    a. on Eliquis  . Bilateral knee pain   . CHF (congestive heart failure) (HCC)   . Depression   . DJD (degenerative joint disease)   . HTN (hypertension)   . Insomnia   . Obesity, morbid (HCC)   . Persistent atrial fibrillation (HCC)   . Renal insufficiency     Past Surgical History:    Procedure Laterality Date  . ABDOMINAL HYSTERECTOMY    . bilateral knee surgery    . CESAREAN SECTION    . CHOLECYSTECTOMY  2001  . COLONOSCOPY  2007   Dr. Jena Gauss: friable anal canal, few scattered left-sided diverticula, benign colonic biopsies   . right ankle surgery    . TONSILLECTOMY    . VESICOVAGINAL FISTULA CLOSURE W/ TAH      Current Medications: Outpatient Medications Prior to Visit  Medication Sig Dispense Refill  . ALPRAZolam (XANAX) 1 MG tablet Take 1 mg by mouth as needed. Anxiety    . apixaban (ELIQUIS) 5 MG TABS tablet Take 5 mg by mouth 2 (two) times daily.    . metoprolol (TOPROL-XL) 200 MG 24 hr tablet TAKE (1) TABLET BY MOUTH ONCE DAILY. 90 tablet 3  . oxyCODONE-acetaminophen (PERCOCET) 10-325 MG per tablet Take 1 tablet by mouth every 6 (six) hours.    . pantoprazole (PROTONIX) 40 MG tablet TAKE 1 TABLET BY MOUTH 30 MINUTES BEFORE BREAKFAST. 30 tablet 11  . potassium gluconate 595 (99 K) MG TABS tablet Take 595 mg by mouth.    . pravastatin (PRAVACHOL) 40 MG tablet Take 40 mg by mouth daily.    Marland Kitchen torsemide (DEMADEX) 100 MG tablet Take only if you gain weight of 3 pounds or develop edema (Patient taking differently: 50 mg. Take only if you gain weight of 3 pounds or develop edema)    .  diltiazem (CARDIZEM LA) 240 MG 24 hr tablet Take 240 mg by mouth daily.    Marland Kitchen. lisinopril (PRINIVIL,ZESTRIL) 20 MG tablet TAKE ONE TABLET BY MOUTH ONCE DAILY. 90 tablet 3   No facility-administered medications prior to visit.      Allergies:   Patient has no known allergies.   Social History   Socioeconomic History  . Marital status: Divorced    Spouse name: None  . Number of children: None  . Years of education: None  . Highest education level: None  Social Needs  . Financial resource strain: None  . Food insecurity - worry: None  . Food insecurity - inability: None  . Transportation needs - medical: None  . Transportation needs - non-medical: None  Occupational History   . None  Tobacco Use  . Smoking status: Never Smoker  . Smokeless tobacco: Current User    Types: Chew  . Tobacco comment: Never smoked  Substance and Sexual Activity  . Alcohol use: No    Alcohol/week: 0.0 oz  . Drug use: No  . Sexual activity: Yes    Birth control/protection: Surgical  Other Topics Concern  . None  Social History Narrative  . None     Family History:  The patient's family history includes CVA in her son; Cancer in her father.   Review of Systems:   Please see the history of present illness.     General:  No chills, fever, night sweats or weight changes.  Cardiovascular:  No chest pain, dyspnea on exertion, orthopnea, paroxysmal nocturnal dyspnea. Positive for palpitations and lower extremity edema.  Dermatological: No rash, lesions/masses Respiratory: No cough, dyspnea Urologic: No hematuria, dysuria Abdominal:   No nausea, vomiting, diarrhea, bright red blood per rectum, melena, or hematemesis Neurologic:  No visual changes, wkns, changes in mental status. All other systems reviewed and are otherwise negative except as noted above.   Physical Exam:    VS:  BP 110/78   Pulse 63   Ht 5\' 3"  (1.6 m)   Wt (!) 365 lb (165.6 kg)   SpO2 94%   BMI 64.66 kg/m    General: Well developed, morbidly obese Caucasian female appearing in no acute distress. Tearful during the encounter when talking about her son.  Head: Normocephalic, atraumatic, sclera non-icteric, no xanthomas, nares are without discharge.  Neck: No carotid bruits. JVD not elevated.  Lungs: Respirations regular and unlabored, without wheezes or rales.  Heart: Irregularly irregular. No S3 or S4.  No murmur, no rubs, or gallops appreciated. Abdomen: Soft, non-tender, non-distended with normoactive bowel sounds. No hepatomegaly. No rebound/guarding. No obvious abdominal masses. Msk:  Strength and tone appear normal for age. No joint deformities or effusions. Extremities: No clubbing or cyanosis.  Trace lower extremity edema.  Distal pedal pulses are 2+ bilaterally. Neuro: Alert and oriented X 3. Moves all extremities spontaneously. No focal deficits noted. Psych:  Responds to questions appropriately with a normal affect. Skin: No rashes or lesions noted  Wt Readings from Last 3 Encounters:  04/25/17 (!) 365 lb (165.6 kg)  12/10/16 (!) 360 lb (163.3 kg)  06/12/16 (!) 370 lb (167.8 kg)     Studies/Labs Reviewed:   EKG:  EKG is ordered today.  The ekg ordered today demonstrates atrial fibrillation,  HR 106, with occasional PVC's.   Recent Labs: 04/25/2017: BUN 14; Creatinine, Ser 0.83; Magnesium 1.9; Potassium 4.0; Sodium 141; TSH 3.446   Lipid Panel    Component Value Date/Time   CHOL 169 11/03/2014  1610   TRIG 193 (H) 11/03/2014 0712   HDL 40 (L) 11/03/2014 0712   CHOLHDL 4.2 11/03/2014 0712   VLDL 39 (H) 11/03/2014 0712   LDLCALC 90 11/03/2014 0712    Additional studies/ records that were reviewed today include:   Echocardiogram: 06/2014 Study Conclusions  - Left ventricle: Systolic function was normal. The estimated ejection fraction was in the range of 55% to 60%. - Aortic valve: Mildly calcified annulus. Trileaflet; mildly thickened leaflets. Valve area (VTI): 1.52 cm^2. - Mitral valve: Mildly calcified annulus. - Left atrium: The atrium was severely dilated. - Right ventricle: The cavity size was mildly dilated. - Right atrium: The atrium was severely dilated. - Atrial septum: There was a patent foramen ovale. - Technically adequate study.  Assessment:    1. Chronic atrial fibrillation (HCC)   2. Palpitation   3. Long term current use of anticoagulant   4. Diastolic CHF, chronic (HCC)   5. Essential hypertension      Plan:   In order of problems listed above:  1. Chronic Atrial Fibrillation/ Palpitations - the patient has been experiencing worsening palpitations since the unexpected passing of her son in 01/2017. Her palpitations are more  noticeable when she is upset but also occurring at other times as well and lasting for 20-40 minute intervals. HR is elevated in the 90's to low-100's when checked today.  - she is currently on Cardizem CD 240mg  daily and Toprol-XL 200mg  daily. Will further titrate Cardizem CD to 300mg  daily and continue Toprol-XL at current dosing. Will check BMET, TSH, and Mg due to her increased use of Torsemide, although I suspect her worsening symptoms are related to the stress of her recent loss.  2. Use of Long-Term Anticoagulation - she denies any evidence of active bleeding. Continue on Eliquis for anticoagulation.  3. Chronic Diastolic CHF - she has chronic lower extremity edema but denies any associated orthopnea, PND, or dyspnea on exertion. Weight has been stable on her home scales and she does not appear volume overloaded by physical examination.  - continue Torsemide PRN. Will check BMET to assess kidney function and K+ levels as she has been taking this 3-4 times per week.   4. HTN - BP is well-controlled at 110/78 during today's visit.  - will increase Cardizem to 300mg  daily to assist with tachycardia. Will reduce Lisinopril to 10mg  daily to allow for the titration of her AV nodal blocking agents as BP has been soft by her report.    Medication Adjustments/Labs and Tests Ordered: Current medicines are reviewed at length with the patient today.  Concerns regarding medicines are outlined above.  Medication changes, Labs and Tests ordered today are listed in the Patient Instructions below. Patient Instructions  Your physician recommends that you schedule a follow-up appointment in: 2 months with Dr Wyline Mood  DECREASE Lisinopril to 10 mg daily  INCREASE Cardizem to 300 mg daily  Get lab work today.   Thank you for choosing [facility] !        Signed, Ellsworth Lennox, PA-C  04/25/2017 7:59 PM    [facility] [address]  [place], [place] [address] Phone: [phone]

## 2017-04-25 ENCOUNTER — Ambulatory Visit (INDEPENDENT_AMBULATORY_CARE_PROVIDER_SITE_OTHER): Payer: BLUE CROSS/BLUE SHIELD | Admitting: Student

## 2017-04-25 ENCOUNTER — Encounter: Payer: Self-pay | Admitting: Student

## 2017-04-25 ENCOUNTER — Other Ambulatory Visit (HOSPITAL_COMMUNITY)
Admission: RE | Admit: 2017-04-25 | Discharge: 2017-04-25 | Disposition: A | Discharge status: Home / Self Care | Payer: BLUE CROSS/BLUE SHIELD | Source: Ambulatory Visit | Attending: Student | Admitting: Student

## 2017-04-25 VITALS — BP 110/78 | HR 63 | Ht 63.0 in | Wt 365.0 lb

## 2017-04-25 DIAGNOSIS — R002 Palpitations: Secondary | ICD-10-CM

## 2017-04-25 DIAGNOSIS — I10 Essential (primary) hypertension: Secondary | ICD-10-CM

## 2017-04-25 DIAGNOSIS — I5032 Chronic diastolic (congestive) heart failure: Secondary | ICD-10-CM

## 2017-04-25 DIAGNOSIS — Z7901 Long term (current) use of anticoagulants: Secondary | ICD-10-CM | POA: Diagnosis not present

## 2017-04-25 DIAGNOSIS — I482 Chronic atrial fibrillation, unspecified: Secondary | ICD-10-CM

## 2017-04-25 LAB — MAGNESIUM: MAGNESIUM: 1.9 mg/dL (ref 1.7–2.4)

## 2017-04-25 LAB — BASIC METABOLIC PANEL
Anion gap: 11 (ref 5–15)
BUN: 14 mg/dL (ref 6–20)
CALCIUM: 9 mg/dL (ref 8.9–10.3)
CO2: 28 mmol/L (ref 22–32)
Chloride: 102 mmol/L (ref 101–111)
Creatinine, Ser: 0.83 mg/dL (ref 0.44–1.00)
Glucose, Bld: 123 mg/dL — ABNORMAL HIGH (ref 65–99)
Potassium: 4 mmol/L (ref 3.5–5.1)
Sodium: 141 mmol/L (ref 135–145)

## 2017-04-25 LAB — TSH: TSH: 3.446 u[IU]/mL (ref 0.350–4.500)

## 2017-04-25 MED ORDER — DILTIAZEM HCL ER COATED BEADS 300 MG PO CP24: 300.0000 mg | ORAL_CAPSULE | Freq: Every day | ORAL | 3 refills | Status: DC

## 2017-04-25 MED ORDER — LISINOPRIL 10 MG PO TABS: 10.0000 mg | ORAL_TABLET | Freq: Every day | ORAL | 3 refills | Status: DC

## 2017-04-25 NOTE — Patient Instructions (Signed)
Your physician recommends that you schedule a follow-up appointment in: 2 months with Dr Wyline MoodBranch    DECREASE lisinopril to 10 mg daily    INCREASE Cardizem to 300 mg daily    Get lab work, bmet,tsh,      Thank you for choosing [facility] !

## 2017-05-03 DIAGNOSIS — J449 Chronic obstructive pulmonary disease, unspecified: Secondary | ICD-10-CM | POA: Diagnosis not present

## 2017-05-16 DIAGNOSIS — M545 Low back pain: Secondary | ICD-10-CM | POA: Diagnosis not present

## 2017-05-16 DIAGNOSIS — I482 Chronic atrial fibrillation: Secondary | ICD-10-CM | POA: Diagnosis not present

## 2017-05-16 DIAGNOSIS — J449 Chronic obstructive pulmonary disease, unspecified: Secondary | ICD-10-CM | POA: Diagnosis not present

## 2017-05-16 DIAGNOSIS — J9611 Chronic respiratory failure with hypoxia: Secondary | ICD-10-CM | POA: Diagnosis not present

## 2017-05-31 DIAGNOSIS — J449 Chronic obstructive pulmonary disease, unspecified: Secondary | ICD-10-CM | POA: Diagnosis not present

## 2017-07-01 ENCOUNTER — Ambulatory Visit: Payer: BLUE CROSS/BLUE SHIELD | Admitting: Cardiology

## 2017-07-01 DIAGNOSIS — J449 Chronic obstructive pulmonary disease, unspecified: Secondary | ICD-10-CM | POA: Diagnosis not present

## 2017-07-19 ENCOUNTER — Encounter: Payer: Self-pay | Admitting: Cardiology

## 2017-07-19 ENCOUNTER — Ambulatory Visit: Payer: BLUE CROSS/BLUE SHIELD | Admitting: Cardiology

## 2017-07-19 VITALS — BP 110/80 | HR 79 | Ht 62.0 in | Wt 369.0 lb

## 2017-07-19 DIAGNOSIS — I4891 Unspecified atrial fibrillation: Secondary | ICD-10-CM | POA: Diagnosis not present

## 2017-07-19 DIAGNOSIS — R600 Localized edema: Secondary | ICD-10-CM

## 2017-07-19 DIAGNOSIS — I10 Essential (primary) hypertension: Secondary | ICD-10-CM | POA: Diagnosis not present

## 2017-07-19 NOTE — Patient Instructions (Signed)

## 2017-07-19 NOTE — Progress Notes (Signed)
Clinical Summary Jasmin Martin is a 62 y.o.female Seen today for follow up of the following medical problem.s   1. Afib - occasional palpitations, fairly rare and mild. No bleeding on eliquis  - last visit with PA Strader, ongonig palpitations and dilt was increased to 300mg  daily. A lot of the symptoms were related with ongoing stress from the recent death of her son  -doing well since dilt increase.  - no bleeding on eliquis.   2. HTN - she is compliant with meds  3. Nocturnal hypoxia - uses home O2 at night, reports negative sleep study for OSA  4. Chest pain - 06/2014 MPI negative.  - denies any recent symptoms  5. LE edema - doing well on toresmide.  - has labs coming up with Dr Juanetta GoslingHawkins next month   SH: son passed away 01/2017 Past Medical History:  Diagnosis Date  . Arthritis   . Atrial fibrillation (HCC)    a. on Eliquis  . Bilateral knee pain   . CHF (congestive heart failure) (HCC)   . Depression   . DJD (degenerative joint disease)   . HTN (hypertension)   . Insomnia   . Obesity, morbid (HCC)   . Persistent atrial fibrillation (HCC)   . Renal insufficiency      No Known Allergies   Current Outpatient Medications  Medication Sig Dispense Refill  . ALPRAZolam (XANAX) 1 MG tablet Take 1 mg by mouth as needed. Anxiety    . apixaban (ELIQUIS) 5 MG TABS tablet Take 5 mg by mouth 2 (two) times daily.    Marland Kitchen. diltiazem (CARDIZEM CD) 300 MG 24 hr capsule Take 1 capsule (300 mg total) by mouth daily. 90 capsule 3  . lisinopril (PRINIVIL,ZESTRIL) 10 MG tablet Take 1 tablet (10 mg total) by mouth daily. 90 tablet 3  . metoprolol (TOPROL-XL) 200 MG 24 hr tablet TAKE (1) TABLET BY MOUTH ONCE DAILY. 90 tablet 3  . oxyCODONE-acetaminophen (PERCOCET) 10-325 MG per tablet Take 1 tablet by mouth every 6 (six) hours.    . pantoprazole (PROTONIX) 40 MG tablet TAKE 1 TABLET BY MOUTH 30 MINUTES BEFORE BREAKFAST. 30 tablet 11  . potassium gluconate 595 (99 K) MG TABS  tablet Take 595 mg by mouth.    . pravastatin (PRAVACHOL) 40 MG tablet Take 40 mg by mouth daily.    Marland Kitchen. torsemide (DEMADEX) 100 MG tablet Take only if you gain weight of 3 pounds or develop edema (Patient taking differently: 50 mg. Take only if you gain weight of 3 pounds or develop edema)     No current facility-administered medications for this visit.      Past Surgical History:  Procedure Laterality Date  . ABDOMINAL HYSTERECTOMY    . bilateral knee surgery    . CESAREAN SECTION    . CHOLECYSTECTOMY  2001  . COLONOSCOPY  2007   Dr. Jena Gaussourk: friable anal canal, few scattered left-sided diverticula, benign colonic biopsies   . right ankle surgery    . TONSILLECTOMY    . VESICOVAGINAL FISTULA CLOSURE W/ TAH       No Known Allergies    Family History  Problem Relation Age of Onset  . Cancer Father        Lung   . CVA Son   . Colon cancer Neg Hx      Social History Jasmin Martin reports that she has never smoked. Her smokeless tobacco use includes chew. Jasmin Martin reports that she does not drink  alcohol.   Review of Systems CONSTITUTIONAL: No weight loss, fever, chills, weakness or fatigue.  HEENT: Eyes: No visual loss, blurred vision, double vision or yellow sclerae.No hearing loss, sneezing, congestion, runny nose or sore throat.  SKIN: No rash or itching.  CARDIOVASCULAR: RRR, no m/r/g, no jvd RESPIRATORY: No shortness of breath, cough or sputum.  GASTROINTESTINAL: No anorexia, nausea, vomiting or diarrhea. No abdominal pain or blood.  GENITOURINARY: No burning on urination, no polyuria NEUROLOGICAL: No headache, dizziness, syncope, paralysis, ataxia, numbness or tingling in the extremities. No change in bowel or bladder control.  MUSCULOSKELETAL: No muscle, back pain, joint pain or stiffness.  LYMPHATICS: No enlarged nodes. No history of splenectomy.  PSYCHIATRIC: No history of depression or anxiety.  ENDOCRINOLOGIC: No reports of sweating, cold or heat intolerance. No  polyuria or polydipsia.  Marland Kitchen   Physical Examination Vitals:   07/19/17 1341  BP: 110/80  Pulse: 79  SpO2: 97%   Vitals:   07/19/17 1341  Weight: (!) 369 lb (167.4 kg)  Height: 5\' 2"  (1.575 m)    Gen: resting comfortably, no acute distress HEENT: no scleral icterus, pupils equal round and reactive, no palptable cervical adenopathy,  CV: RRR, no m/r/g, no jvd Resp: Clear to auscultation bilaterally GI: abdomen is soft, non-tender, non-distended, normal bowel sounds, no hepatosplenomegaly MSK: extremities are warm, no edema.  Skin: warm, no rash Neuro:  no focal deficits Psych: appropriate affect   Diagnostic Studies  07/14/11 Echo: LVEF 50-55%, mild LVH, mod LAE  06/2014 MPI IMPRESSION: 1. No reversible ischemia or infarction. Hypertensive response to stress.  2. Normal left ventricular wall motion.  3. Left ventricular ejection fraction 30%. However, this is likely due to gating abnormalities as echocardiogram on 06/17/14 reported normal LV systolic function, LVEF 55-60%. Function appears grossly normal on nuclear images.  4. Low-risk stress test findings*.  06/2014 echo Study Conclusions  - Left ventricle: Systolic function was normal. The estimated ejection fraction was in the range of 55% to 60%. - Aortic valve: Mildly calcified annulus. Trileaflet; mildly thickened leaflets. Valve area (VTI): 1.52 cm^2. - Mitral valve: Mildly calcified annulus. - Left atrium: The atrium was severely dilated. - Right ventricle: The cavity size was mildly dilated. - Right atrium: The atrium was severely dilated. - Atrial septum: There was a patent foramen ovale. - Technically adequate study.     Assessment and Plan  1. Afib - CHADS2Vasc score is 2, continue eliquis. - no symptmos, continue current meds  2. HTN - at goal, continue current meds   3. LE edema - controlled, continue current diuretics.           Antoine Poche, M.D.

## 2017-07-24 ENCOUNTER — Encounter: Payer: Self-pay | Admitting: Cardiology

## 2017-07-29 ENCOUNTER — Other Ambulatory Visit: Payer: Self-pay | Admitting: Nurse Practitioner

## 2017-07-31 DIAGNOSIS — J449 Chronic obstructive pulmonary disease, unspecified: Secondary | ICD-10-CM | POA: Diagnosis not present

## 2017-08-31 DIAGNOSIS — J449 Chronic obstructive pulmonary disease, unspecified: Secondary | ICD-10-CM | POA: Diagnosis not present

## 2017-09-16 DIAGNOSIS — Z79891 Long term (current) use of opiate analgesic: Secondary | ICD-10-CM | POA: Diagnosis not present

## 2017-09-16 DIAGNOSIS — J441 Chronic obstructive pulmonary disease with (acute) exacerbation: Secondary | ICD-10-CM | POA: Diagnosis not present

## 2017-09-16 DIAGNOSIS — I5032 Chronic diastolic (congestive) heart failure: Secondary | ICD-10-CM | POA: Diagnosis not present

## 2017-09-16 DIAGNOSIS — I482 Chronic atrial fibrillation: Secondary | ICD-10-CM | POA: Diagnosis not present

## 2017-09-16 DIAGNOSIS — M545 Low back pain: Secondary | ICD-10-CM | POA: Diagnosis not present

## 2017-09-28 ENCOUNTER — Other Ambulatory Visit: Payer: Self-pay | Admitting: Cardiology

## 2017-09-30 DIAGNOSIS — J449 Chronic obstructive pulmonary disease, unspecified: Secondary | ICD-10-CM | POA: Diagnosis not present

## 2017-10-31 DIAGNOSIS — J449 Chronic obstructive pulmonary disease, unspecified: Secondary | ICD-10-CM | POA: Diagnosis not present

## 2017-11-29 ENCOUNTER — Other Ambulatory Visit: Payer: Self-pay | Admitting: Nurse Practitioner

## 2017-11-29 NOTE — Telephone Encounter (Signed)
Patient needs OV, last seen 12/2015. Limited refills provided.

## 2017-12-01 DIAGNOSIS — J449 Chronic obstructive pulmonary disease, unspecified: Secondary | ICD-10-CM | POA: Diagnosis not present

## 2017-12-03 ENCOUNTER — Encounter: Payer: Self-pay | Admitting: Internal Medicine

## 2017-12-03 NOTE — Telephone Encounter (Signed)
PATIENT SCHEDULED AND LETTER SENT  °

## 2017-12-16 DIAGNOSIS — I482 Chronic atrial fibrillation: Secondary | ICD-10-CM | POA: Diagnosis not present

## 2017-12-16 DIAGNOSIS — Z23 Encounter for immunization: Secondary | ICD-10-CM | POA: Diagnosis not present

## 2017-12-16 DIAGNOSIS — J9611 Chronic respiratory failure with hypoxia: Secondary | ICD-10-CM | POA: Diagnosis not present

## 2017-12-16 DIAGNOSIS — I5032 Chronic diastolic (congestive) heart failure: Secondary | ICD-10-CM | POA: Diagnosis not present

## 2017-12-16 DIAGNOSIS — J449 Chronic obstructive pulmonary disease, unspecified: Secondary | ICD-10-CM | POA: Diagnosis not present

## 2017-12-31 DIAGNOSIS — J449 Chronic obstructive pulmonary disease, unspecified: Secondary | ICD-10-CM | POA: Diagnosis not present

## 2018-01-29 ENCOUNTER — Other Ambulatory Visit: Payer: Self-pay | Admitting: Gastroenterology

## 2018-01-29 ENCOUNTER — Other Ambulatory Visit: Payer: Self-pay | Admitting: Cardiology

## 2018-01-31 DIAGNOSIS — J449 Chronic obstructive pulmonary disease, unspecified: Secondary | ICD-10-CM | POA: Diagnosis not present

## 2018-03-01 ENCOUNTER — Other Ambulatory Visit: Payer: Self-pay | Admitting: Gastroenterology

## 2018-03-02 DIAGNOSIS — J449 Chronic obstructive pulmonary disease, unspecified: Secondary | ICD-10-CM | POA: Diagnosis not present

## 2018-03-04 ENCOUNTER — Encounter: Payer: Self-pay | Admitting: Internal Medicine

## 2018-03-04 ENCOUNTER — Telehealth: Payer: Self-pay | Admitting: Gastroenterology

## 2018-03-04 ENCOUNTER — Ambulatory Visit: Payer: BLUE CROSS/BLUE SHIELD | Admitting: Gastroenterology

## 2018-03-04 NOTE — Telephone Encounter (Signed)
PATIENT WAS A NO SHOW AND LETTER SENT  °

## 2018-03-17 DIAGNOSIS — J449 Chronic obstructive pulmonary disease, unspecified: Secondary | ICD-10-CM | POA: Diagnosis not present

## 2018-03-17 DIAGNOSIS — J9611 Chronic respiratory failure with hypoxia: Secondary | ICD-10-CM | POA: Diagnosis not present

## 2018-03-17 DIAGNOSIS — I110 Hypertensive heart disease with heart failure: Secondary | ICD-10-CM | POA: Diagnosis not present

## 2018-03-17 DIAGNOSIS — R739 Hyperglycemia, unspecified: Secondary | ICD-10-CM | POA: Diagnosis not present

## 2018-03-17 DIAGNOSIS — I482 Chronic atrial fibrillation, unspecified: Secondary | ICD-10-CM | POA: Diagnosis not present

## 2018-03-17 DIAGNOSIS — M545 Low back pain: Secondary | ICD-10-CM | POA: Diagnosis not present

## 2018-03-17 DIAGNOSIS — Z0000 Encounter for general adult medical examination without abnormal findings: Secondary | ICD-10-CM | POA: Diagnosis not present

## 2018-03-17 DIAGNOSIS — E039 Hypothyroidism, unspecified: Secondary | ICD-10-CM | POA: Diagnosis not present

## 2018-03-17 DIAGNOSIS — I5032 Chronic diastolic (congestive) heart failure: Secondary | ICD-10-CM | POA: Diagnosis not present

## 2018-04-02 DIAGNOSIS — J449 Chronic obstructive pulmonary disease, unspecified: Secondary | ICD-10-CM | POA: Diagnosis not present

## 2018-04-11 ENCOUNTER — Ambulatory Visit (HOSPITAL_COMMUNITY)
Admission: RE | Admit: 2018-04-11 | Discharge: 2018-04-11 | Disposition: A | Discharge status: Home / Self Care | Payer: BLUE CROSS/BLUE SHIELD | Source: Ambulatory Visit | Attending: Pulmonary Disease | Admitting: Pulmonary Disease

## 2018-04-11 ENCOUNTER — Other Ambulatory Visit (HOSPITAL_COMMUNITY): Payer: Self-pay | Admitting: Pulmonary Disease

## 2018-04-11 DIAGNOSIS — W19XXXD Unspecified fall, subsequent encounter: Secondary | ICD-10-CM

## 2018-04-11 DIAGNOSIS — M25511 Pain in right shoulder: Secondary | ICD-10-CM

## 2018-04-11 DIAGNOSIS — S4991XA Unspecified injury of right shoulder and upper arm, initial encounter: Secondary | ICD-10-CM | POA: Diagnosis not present

## 2018-04-11 DIAGNOSIS — M19011 Primary osteoarthritis, right shoulder: Secondary | ICD-10-CM | POA: Diagnosis not present

## 2018-04-30 ENCOUNTER — Other Ambulatory Visit: Payer: Self-pay | Admitting: Student

## 2018-04-30 NOTE — Telephone Encounter (Signed)
This is a Justice pt.  °

## 2018-05-03 DIAGNOSIS — J449 Chronic obstructive pulmonary disease, unspecified: Secondary | ICD-10-CM | POA: Diagnosis not present

## 2018-05-05 ENCOUNTER — Encounter: Payer: Self-pay | Admitting: Cardiology

## 2018-05-05 ENCOUNTER — Ambulatory Visit: Payer: BLUE CROSS/BLUE SHIELD | Admitting: Cardiology

## 2018-05-05 VITALS — BP 121/71 | HR 71 | Ht 64.0 in | Wt 378.6 lb

## 2018-05-05 DIAGNOSIS — I4891 Unspecified atrial fibrillation: Secondary | ICD-10-CM

## 2018-05-05 DIAGNOSIS — R600 Localized edema: Secondary | ICD-10-CM

## 2018-05-05 DIAGNOSIS — I10 Essential (primary) hypertension: Secondary | ICD-10-CM

## 2018-05-05 NOTE — Patient Instructions (Signed)

## 2018-05-05 NOTE — Progress Notes (Signed)
Clinical Summary Jasmin Martin is a 63 y.o.female seen today for follow up of the following medical problem.s   1. Afib - occasional palpitations, mild  - compliant with meds. Denies any bleeding on eliquis.   2. HTN -compliant with meds  3. Nocturnal hypoxia - uses home O2 at night, reports negative sleep study for OSA   4. LE edema - well controlled. Takes torsemide 100mg  prn, about 3 times a week   SH: son passed away 02-Mar-2017   Past Medical History:  Diagnosis Date  . Arthritis   . Atrial fibrillation (HCC)    a. on Eliquis  . Bilateral knee pain   . CHF (congestive heart failure) (HCC)   . Depression   . DJD (degenerative joint disease)   . HTN (hypertension)   . Insomnia   . Obesity, morbid (HCC)   . Persistent atrial fibrillation (HCC)   . Renal insufficiency      No Known Allergies   Current Outpatient Medications  Medication Sig Dispense Refill  . ALPRAZolam (XANAX) 1 MG tablet Take 1 mg by mouth as needed. Anxiety    . apixaban (ELIQUIS) 5 MG TABS tablet Take 5 mg by mouth 2 (two) times daily.    Marland Kitchen diltiazem (CARDIZEM CD) 300 MG 24 hr capsule TAKE ONE CAPSULE BY MOUTH ONCE DAILY. 30 capsule 0  . lisinopril (PRINIVIL,ZESTRIL) 10 MG tablet Take 1 tablet (10 mg total) by mouth daily. 90 tablet 3  . lisinopril (PRINIVIL,ZESTRIL) 20 MG tablet TAKE ONE TABLET BY MOUTH ONCE DAILY. 90 tablet 0  . lisinopril (PRINIVIL,ZESTRIL) 20 MG tablet TAKE ONE TABLET BY MOUTH ONCE DAILY. 90 tablet 0  . metoprolol (TOPROL-XL) 200 MG 24 hr tablet TAKE (1) TABLET BY MOUTH ONCE DAILY. 90 tablet 0  . oxyCODONE-acetaminophen (PERCOCET) 10-325 MG per tablet Take 1 tablet by mouth every 6 (six) hours.    . pantoprazole (PROTONIX) 40 MG tablet TAKE 1 TABLET BY MOUTH 30 MINUTES BEFORE BREAKFAST. 90 tablet 3  . potassium gluconate 595 (99 K) MG TABS tablet Take 595 mg by mouth.    . pravastatin (PRAVACHOL) 40 MG tablet Take 40 mg by mouth daily.    Marland Kitchen torsemide (DEMADEX)  100 MG tablet Take only if you gain weight of 3 pounds or develop edema (Patient taking differently: 50 mg. Take only if you gain weight of 3 pounds or develop edema)     No current facility-administered medications for this visit.      Past Surgical History:  Procedure Laterality Date  . ABDOMINAL HYSTERECTOMY    . bilateral knee surgery    . CESAREAN SECTION    . CHOLECYSTECTOMY  2001  . COLONOSCOPY  2007   Dr. Jena Gauss: friable anal canal, few scattered left-sided diverticula, benign colonic biopsies   . right ankle surgery    . TONSILLECTOMY    . VESICOVAGINAL FISTULA CLOSURE W/ TAH       No Known Allergies    Family History  Problem Relation Age of Onset  . Cancer Father        Lung   . CVA Son   . Colon cancer Neg Hx      Social History Ms. Gravely reports that she has never smoked. She has never used smokeless tobacco. Ms. Sontag reports no history of alcohol use.   Review of Systems CONSTITUTIONAL: No weight loss, fever, chills, weakness or fatigue.  HEENT: Eyes: No visual loss, blurred vision, double vision or yellow sclerae.No hearing  loss, sneezing, congestion, runny nose or sore throat.  SKIN: No rash or itching.  CARDIOVASCULAR: per hpi RESPIRATORY: No shortness of breath, cough or sputum.  GASTROINTESTINAL: No anorexia, nausea, vomiting or diarrhea. No abdominal pain or blood.  GENITOURINARY: No burning on urination, no polyuria NEUROLOGICAL: No headache, dizziness, syncope, paralysis, ataxia, numbness or tingling in the extremities. No change in bowel or bladder control.  MUSCULOSKELETAL: No muscle, back pain, joint pain or stiffness.  LYMPHATICS: No enlarged nodes. No history of splenectomy.  PSYCHIATRIC: No history of depression or anxiety.  ENDOCRINOLOGIC: No reports of sweating, cold or heat intolerance. No polyuria or polydipsia.  Marland Kitchen   Physical Examination Vitals:   05/05/18 1533  BP: 121/71  Pulse: 71  SpO2: 93%   Vitals:   05/05/18 1533    Weight: (!) 378 lb 9.6 oz (171.7 kg)  Height: 5\' 4"  (1.626 m)    Gen: resting comfortably, no acute distress HEENT: no scleral icterus, pupils equal round and reactive, no palptable cervical adenopathy,  CV: RRR, no m/r/g. No jvd Resp: Clear to auscultation bilaterally GI: abdomen is soft, non-tender, non-distended, normal bowel sounds, no hepatosplenomegaly MSK: extremities are warm, no edema.  Skin: warm, no rash Neuro:  no focal deficits Psych: appropriate affect   Diagnostic Studies 07/14/11 Echo: LVEF 50-55%, mild LVH, mod LAE  06/2014 MPI IMPRESSION: 1. No reversible ischemia or infarction. Hypertensive response to stress.  2. Normal left ventricular wall motion.  3. Left ventricular ejection fraction 30%. However, this is likely due to gating abnormalities as echocardiogram on 06/17/14 reported normal LV systolic function, LVEF 55-60%. Function appears grossly normal on nuclear images.  4. Low-risk stress test findings*.  06/2014 echo Study Conclusions  - Left ventricle: Systolic function was normal. The estimated ejection fraction was in the range of 55% to 60%. - Aortic valve: Mildly calcified annulus. Trileaflet; mildly thickened leaflets. Valve area (VTI): 1.52 cm^2. - Mitral valve: Mildly calcified annulus. - Left atrium: The atrium was severely dilated. - Right ventricle: The cavity size was mildly dilated. - Right atrium: The atrium was severely dilated. - Atrial septum: There was a patent foramen ovale. - Technically adequate study.     Assessment and Plan  1. Afib - overall controlled, continue current meds including eliquis for stroke prevention - EKG shows rate controlled afib  2. HTN -at goal, she will continue current meds   3. LE edema - overall controlled, continue her prn torsemide - request labs from pcp   F?u 6 months      Antoine Poche, M.D.

## 2018-05-06 ENCOUNTER — Encounter: Payer: Self-pay | Admitting: *Deleted

## 2018-05-31 ENCOUNTER — Other Ambulatory Visit: Payer: Self-pay | Admitting: Student

## 2018-06-01 DIAGNOSIS — I509 Heart failure, unspecified: Secondary | ICD-10-CM | POA: Diagnosis not present

## 2018-06-01 DIAGNOSIS — J449 Chronic obstructive pulmonary disease, unspecified: Secondary | ICD-10-CM | POA: Diagnosis not present

## 2018-06-16 DIAGNOSIS — I482 Chronic atrial fibrillation, unspecified: Secondary | ICD-10-CM | POA: Diagnosis not present

## 2018-06-16 DIAGNOSIS — J9611 Chronic respiratory failure with hypoxia: Secondary | ICD-10-CM | POA: Diagnosis not present

## 2018-06-16 DIAGNOSIS — J449 Chronic obstructive pulmonary disease, unspecified: Secondary | ICD-10-CM | POA: Diagnosis not present

## 2018-06-26 ENCOUNTER — Other Ambulatory Visit: Payer: Self-pay | Admitting: Cardiology

## 2018-07-02 DIAGNOSIS — J449 Chronic obstructive pulmonary disease, unspecified: Secondary | ICD-10-CM | POA: Diagnosis not present

## 2018-07-02 DIAGNOSIS — I509 Heart failure, unspecified: Secondary | ICD-10-CM | POA: Diagnosis not present

## 2018-08-01 DIAGNOSIS — I509 Heart failure, unspecified: Secondary | ICD-10-CM | POA: Diagnosis not present

## 2018-08-01 DIAGNOSIS — J449 Chronic obstructive pulmonary disease, unspecified: Secondary | ICD-10-CM | POA: Diagnosis not present

## 2018-09-01 DIAGNOSIS — I509 Heart failure, unspecified: Secondary | ICD-10-CM | POA: Diagnosis not present

## 2018-09-01 DIAGNOSIS — J449 Chronic obstructive pulmonary disease, unspecified: Secondary | ICD-10-CM | POA: Diagnosis not present

## 2018-09-17 DIAGNOSIS — J9611 Chronic respiratory failure with hypoxia: Secondary | ICD-10-CM | POA: Diagnosis not present

## 2018-09-17 DIAGNOSIS — J449 Chronic obstructive pulmonary disease, unspecified: Secondary | ICD-10-CM | POA: Diagnosis not present

## 2018-09-17 DIAGNOSIS — M545 Low back pain: Secondary | ICD-10-CM | POA: Diagnosis not present

## 2018-09-17 DIAGNOSIS — I5032 Chronic diastolic (congestive) heart failure: Secondary | ICD-10-CM | POA: Diagnosis not present

## 2018-09-25 ENCOUNTER — Other Ambulatory Visit: Payer: Self-pay | Admitting: Cardiology

## 2018-10-01 DIAGNOSIS — J449 Chronic obstructive pulmonary disease, unspecified: Secondary | ICD-10-CM | POA: Diagnosis not present

## 2018-10-01 DIAGNOSIS — I509 Heart failure, unspecified: Secondary | ICD-10-CM | POA: Diagnosis not present

## 2018-11-01 DIAGNOSIS — J449 Chronic obstructive pulmonary disease, unspecified: Secondary | ICD-10-CM | POA: Diagnosis not present

## 2018-11-01 DIAGNOSIS — I509 Heart failure, unspecified: Secondary | ICD-10-CM | POA: Diagnosis not present

## 2018-11-24 ENCOUNTER — Other Ambulatory Visit: Payer: Self-pay | Admitting: Gastroenterology

## 2018-12-02 DIAGNOSIS — J449 Chronic obstructive pulmonary disease, unspecified: Secondary | ICD-10-CM | POA: Diagnosis not present

## 2018-12-02 DIAGNOSIS — I509 Heart failure, unspecified: Secondary | ICD-10-CM | POA: Diagnosis not present

## 2018-12-21 ENCOUNTER — Encounter (HOSPITAL_COMMUNITY): Payer: Self-pay | Admitting: Emergency Medicine

## 2018-12-21 ENCOUNTER — Emergency Department (HOSPITAL_COMMUNITY)
Admission: EM | Admit: 2018-12-21 | Discharge: 2018-12-21 | Disposition: A | Discharge status: Home / Self Care | Payer: Medicare Other | Attending: Emergency Medicine | Admitting: Emergency Medicine

## 2018-12-21 ENCOUNTER — Other Ambulatory Visit: Payer: Self-pay

## 2018-12-21 ENCOUNTER — Emergency Department (HOSPITAL_COMMUNITY): Payer: Medicare Other

## 2018-12-21 DIAGNOSIS — M7989 Other specified soft tissue disorders: Secondary | ICD-10-CM | POA: Diagnosis not present

## 2018-12-21 DIAGNOSIS — Z7901 Long term (current) use of anticoagulants: Secondary | ICD-10-CM | POA: Diagnosis not present

## 2018-12-21 DIAGNOSIS — Z79899 Other long term (current) drug therapy: Secondary | ICD-10-CM | POA: Diagnosis not present

## 2018-12-21 DIAGNOSIS — R202 Paresthesia of skin: Secondary | ICD-10-CM | POA: Insufficient documentation

## 2018-12-21 DIAGNOSIS — M79672 Pain in left foot: Secondary | ICD-10-CM | POA: Diagnosis present

## 2018-12-21 DIAGNOSIS — R52 Pain, unspecified: Secondary | ICD-10-CM | POA: Diagnosis not present

## 2018-12-21 DIAGNOSIS — L03116 Cellulitis of left lower limb: Secondary | ICD-10-CM | POA: Insufficient documentation

## 2018-12-21 DIAGNOSIS — R0902 Hypoxemia: Secondary | ICD-10-CM | POA: Diagnosis not present

## 2018-12-21 DIAGNOSIS — R5381 Other malaise: Secondary | ICD-10-CM | POA: Diagnosis not present

## 2018-12-21 LAB — BASIC METABOLIC PANEL
Anion gap: 10 (ref 5–15)
BUN: 15 mg/dL (ref 8–23)
CO2: 25 mmol/L (ref 22–32)
Calcium: 8 mg/dL — ABNORMAL LOW (ref 8.9–10.3)
Chloride: 103 mmol/L (ref 98–111)
Creatinine, Ser: 0.95 mg/dL (ref 0.44–1.00)
GFR calc Af Amer: >60 mL/min (ref >60)
GFR calc non Af Amer: >60 mL/min (ref >60)
Glucose, Bld: 123 mg/dL — ABNORMAL HIGH (ref 70–99)
Potassium: 3.9 mmol/L (ref 3.5–5.1)
Sodium: 138 mmol/L (ref 135–145)

## 2018-12-21 LAB — CBC WITH DIFFERENTIAL/PLATELET
Abs Immature Granulocytes: 0.03 10*3/uL (ref 0.00–0.07)
Basophils Absolute: 0 10*3/uL (ref 0.0–0.1)
Basophils Relative: 1 %
Eosinophils Absolute: 0.2 10*3/uL (ref 0.0–0.5)
Eosinophils Relative: 2 %
HCT: 46.8 % — ABNORMAL HIGH (ref 36.0–46.0)
Hemoglobin: 14.5 g/dL (ref 12.0–15.0)
Immature Granulocytes: 0 %
Lymphocytes Relative: 27 %
Lymphs Abs: 2 10*3/uL (ref 0.7–4.0)
MCH: 32.1 pg (ref 26.0–34.0)
MCHC: 31 g/dL (ref 30.0–36.0)
MCV: 103.5 fL — ABNORMAL HIGH (ref 80.0–100.0)
Monocytes Absolute: 1.1 10*3/uL — ABNORMAL HIGH (ref 0.1–1.0)
Monocytes Relative: 14 %
Neutro Abs: 4.2 10*3/uL (ref 1.7–7.7)
Neutrophils Relative %: 56 %
Platelets: 233 10*3/uL (ref 150–400)
RBC: 4.52 MIL/uL (ref 3.87–5.11)
RDW: 12.9 % (ref 11.5–15.5)
WBC: 7.5 10*3/uL (ref 4.0–10.5)
nRBC: 0 % (ref 0.0–0.2)

## 2018-12-21 MED ORDER — CEPHALEXIN 500 MG PO CAPS
500.0000 mg | ORAL_CAPSULE | Freq: Once | ORAL | Status: AC
  Administered 2018-12-21: 500 mg via ORAL
  Filled 2018-12-21: qty 1

## 2018-12-21 MED ORDER — FENTANYL CITRATE (PF) 100 MCG/2ML IJ SOLN
50.0000 ug | Freq: Once | INTRAMUSCULAR | Status: AC
  Administered 2018-12-21: 50 ug via INTRAVENOUS
  Filled 2018-12-21: qty 2

## 2018-12-21 MED ORDER — CEPHALEXIN 250 MG PO CAPS: 250.0000 mg | ORAL_CAPSULE | Freq: Four times a day (QID) | ORAL | 0 refills | Status: AC

## 2018-12-21 NOTE — ED Triage Notes (Signed)
Pt here for increasing left foot pain with no injury. Swelling and redness noted with good pedal pulse.

## 2018-12-21 NOTE — ED Provider Notes (Signed)
[facility]ERGENCY DEPARTMENT Provider Note   CSN: [id number] Arrival date & time: 12/21/18  1535     History   Chief Complaint Chief Complaint  Patient presents with  . Foot Pain    HPI Jasmin SlateRuth J Slatter is a 63 y.o. female presenting for evaluation of foot pain.  Patient states for the past 2 days, she has been having persistently worsening left foot pain.  Pain is mostly dorsal.  Patient states before this, she was having intermittent pins-and-needles-like feeling of both feet.  Patient's pain is gotten to the point where is making it difficult for her to walk.  She has taken her home Percocet without significant improvement of her symptoms.  She has fevers, chills, nausea, vomiting, dental pain.  She denies cuts or injury.  She denies fall, trauma, or injury. She denies h/o similar. H/o afib on anticoagulation. H/o chf. Pt denies h/o DM     HPI  Past Medical History:  Diagnosis Date  . Arthritis   . Atrial fibrillation (HCC)    a. on Eliquis  . Bilateral knee pain   . CHF (congestive heart failure) (HCC)   . Depression   . DJD (degenerative joint disease)   . HTN (hypertension)   . Insomnia   . Obesity, morbid (HCC)   . Persistent atrial fibrillation   . Renal insufficiency     Patient Active Problem List   Diagnosis Date Noted  . Elevated LFTs 07/22/2015  . Chest pain 06/16/2014  . Hypotension 06/16/2014  . Acute kidney injury (HCC) 06/16/2014  . Encounter for therapeutic drug monitoring 04/27/2013  . OA (osteoarthritis) of knee 05/22/2012  . Neuropathy 05/22/2012  . Diastolic CHF, chronic (HCC) 07/31/2011  . Weakness of both legs 05/22/2011  . Long term current use of anticoagulant 06/30/2010  . TENOSYNOVITIS OF FOOT AND ANKLE 03/01/2010  . STRESS FRACTURE, FOOT 03/01/2010  . MUSCLE CRAMPS 10/18/2009  . Morbid obesity (HCC) 08/12/2009  . Anxiety state 07/18/2009  . DYSPNEA 07/18/2009  . PALPITATIONS 06/15/2009  . Hypertension 06/02/2009  . HYPOKALEMIA  05/31/2009  . FIBRILLATION, ATRIAL 05/31/2009  . PNEUMONIA 05/31/2009  . INFLUENZA 05/31/2009    Past Surgical History:  Procedure Laterality Date  . ABDOMINAL HYSTERECTOMY    . bilateral knee surgery    . CESAREAN SECTION    . CHOLECYSTECTOMY  2001  . COLONOSCOPY  2007   Dr. Jena Gaussourk: friable anal canal, few scattered left-sided diverticula, benign colonic biopsies   . right ankle surgery    . TONSILLECTOMY    . VESICOVAGINAL FISTULA CLOSURE W/ TAH       OB History   No obstetric history on file.      Home Medications    Prior to Admission medications   Medication Sig Start Date End Date Taking? Authorizing Provider  apixaban (ELIQUIS) 5 MG TABS tablet Take 5 mg by mouth 2 (two) times daily.    [provider]  cephALEXin (KEFLEX) 250 MG capsule Take 1 capsule (250 mg total) by mouth 4 (four) times daily for 7 days. 12/22/18 12/29/18  Huston Stonehocker, PA-C  diltiazem (CARDIZEM CD) 300 MG 24 hr capsule TAKE ONE CAPSULE BY MOUTH ONCE DAILY. 06/02/18   Strader, Lennart PallBrittany M, PA-C  lisinopril (ZESTRIL) 20 MG tablet TAKE ONE TABLET BY MOUTH ONCE DAILY. 09/25/18   Antoine PocheBranch, Jonathan F, MD  metoprolol (TOPROL-XL) 200 MG 24 hr tablet TAKE (1) TABLET BY MOUTH ONCE DAILY. 09/30/17   Antoine PocheBranch, Jonathan F, MD  oxyCODONE-acetaminophen (PERCOCET) [phone]  MG per tablet Take 1 tablet by mouth every 6 (six) hours.    [provider]  OXYGEN Inhale into the lungs. Wears at night only.    [provider]  pantoprazole (PROTONIX) 40 MG tablet TAKE 1 TABLET BY MOUTH 30 MINUTES BEFORE BREAKFAST. 11/27/18   Anice Paganini, NP  potassium gluconate 595 (99 K) MG TABS tablet Take 595 mg by mouth.    [provider]  pravastatin (PRAVACHOL) 40 MG tablet Take 40 mg by mouth daily.    [provider]  torsemide (DEMADEX) 100 MG tablet Take only if you gain weight of 3 pounds or develop edema Patient taking differently: 50 mg. Take only if you gain weight of 3 pounds or develop  edema 06/18/14   [name], MD    Family History Family History  Problem Relation Age of Onset  . Cancer Father        Lung   . CVA Son   . Colon cancer Neg Hx     Social History Social History   Tobacco Use  . Smoking status: Never Smoker  . Smokeless tobacco: Never Used  . Tobacco comment: Never smoked  Substance Use Topics  . Alcohol use: No    Alcohol/week: 0.0 standard drinks  . Drug use: No     Allergies   Patient has no known allergies.   Review of Systems Review of Systems  Musculoskeletal:       Foot pain  Skin: Positive for color change.  All other systems reviewed and are negative.    Physical Exam Updated Vital Signs BP 116/78   Pulse 85   Temp 98 F (36.7 C) (Oral)   Resp 18   Ht 5' (1.524 m)   Wt (!) 167.4 kg   SpO2 91%   BMI 72.07 kg/m   Physical Exam Vitals signs and nursing note reviewed.  Constitutional:      General: She is not in acute distress.    Appearance: She is well-developed.     Comments: Obese female resting comfortably in the bed in no acute distress  HENT:     Head: Normocephalic and atraumatic.  Eyes:     Conjunctiva/sclera: Conjunctivae normal.     Pupils: Pupils are equal, round, and reactive to light.  Neck:     Musculoskeletal: Normal range of motion and neck supple.  Cardiovascular:     Rate and Rhythm: Normal rate and regular rhythm.  Pulmonary:     Effort: Pulmonary effort is normal. No respiratory distress.     Breath sounds: Normal breath sounds. No wheezing.  Abdominal:     General: There is no distension.     Palpations: Abdomen is soft.     Tenderness: There is no abdominal tenderness. There is no guarding or rebound.  Musculoskeletal: Normal range of motion.       Feet:  Feet:     Comments: Erythema and swelling of the lateral left foot.  Tenderness palpation of the entire left foot.  Good pedal pulses.  Good distal cap refill.  Full active range of motion of the foot without difficulty.  Skin:    General: Skin is warm and dry.     Capillary Refill: Capillary refill takes less than 2 seconds.  Neurological:     Mental Status: She is alert and oriented to person, place, and time.      ED Treatments / Results  Labs (all labs ordered are listed, but only abnormal results  are displayed) Labs Reviewed  CBC WITH DIFFERENTIAL/PLATELET - Abnormal; Notable for the following components:      Result Value   HCT 46.8 (*)    MCV 103.5 (*)    Monocytes Absolute 1.1 (*)    All other components within normal limits  BASIC METABOLIC PANEL - Abnormal; Notable for the following components:   Glucose, Bld 123 (*)    Calcium 8.0 (*)    All other components within normal limits    EKG None  Radiology Dg Foot Complete Left  Result Date: 12/21/2018 CLINICAL DATA:  Swelling and redness EXAM: LEFT FOOT - COMPLETE 3+ VIEW COMPARISON:  None. FINDINGS: No fracture or dislocation. There is diffuse osteopenia. No area of cortical destruction or periosteal reaction. Dorsal soft tissue swelling is seen. Calcaneal enthesophyte. IMPRESSION: No acute osseous abnormality. Electronically Signed   By: Prudencio Pair M.D.   On: 12/21/2018 16:50    Procedures Procedures (including critical care time)  Medications Ordered in ED Medications  fentaNYL (SUBLIMAZE) injection 50 mcg (50 mcg Intravenous Given 12/21/18 1657)  cephALEXin (KEFLEX) capsule 500 mg (500 mg Oral Given 12/21/18 1730)     Initial Impression / Assessment and Plan / ED Course  I have reviewed the triage vital signs and the nursing notes.  Pertinent labs & imaging results that were available during my care of the patient were reviewed by me and considered in my medical decision making (see chart for details).        Patient presenting for evaluation of left foot pain.  Physical exam shows patient appears nontoxic.  She is neurovascularly intact.  Patient with erythema, warmth, and swelling of the left foot.  No significant  calf pain, patient is on blood thinner.  Low suspicion for DVT.  Likely cellulitis.  However considering patient's intermittent pins-and-needles sensation, will obtain basic lab work to ensure no leukocytosis or abnormalities electrolytes.  X-ray ordered to rule out osteo vs stress fx vs nondiabetic Charcot's foot.  X-ray viewed interpreted by me, no fracture dislocation.  Labs reassuring, no leukocytosis.  Patient's pain improved with medication, heart rate improved.  As such, likely cellulitis.  Discussed treatment with antibiotics and pain control.  Encourage follow-up with primary care, patient states she has an appointment tomorrow.  Area or erythema was marked.  Discussed return precautions including signs of systemic infection.  At this time, patient appears safe for discharge.  Return precautions given.  Patient states she understands and agrees to plan.  Final Clinical Impressions(s) / ED Diagnoses   Final diagnoses:  Cellulitis of left lower extremity  Paresthesia    ED Discharge Orders         Ordered    cephALEXin (KEFLEX) 250 MG capsule  4 times daily     12/21/18 [place], Nitisha Civello, PA-C 12/21/18 [place], Logan, DO 12/25/18 1605

## 2018-12-21 NOTE — Discharge Instructions (Addendum)
Take antibiotics as prescribed.  Take entire course, even if your symptoms improve. Continue taking home medications as needed for pain.  You may also use Tylenol for further pain control. Use ice to help with pain and swelling. Follow-up with your primary care doctor tomorrow to schedule appointment.  At that time, they should recheck your foot.  You should also discussed the pins and needle feeling you are having in both feet. Return to emergency room if you develop high fevers, severe worsening pain, red streaking coming up your leg, persistent vomiting, any new, worsening, concerning symptoms.

## 2018-12-22 DIAGNOSIS — L03116 Cellulitis of left lower limb: Secondary | ICD-10-CM | POA: Diagnosis not present

## 2018-12-22 DIAGNOSIS — J449 Chronic obstructive pulmonary disease, unspecified: Secondary | ICD-10-CM | POA: Diagnosis not present

## 2018-12-22 DIAGNOSIS — I482 Chronic atrial fibrillation, unspecified: Secondary | ICD-10-CM | POA: Diagnosis not present

## 2018-12-29 ENCOUNTER — Other Ambulatory Visit: Payer: Self-pay

## 2018-12-29 NOTE — Patient Outreach (Signed)
[place]) Care Management  12/29/2018  Jasmin Martin 06-18-1955 [id number]   Medication Adherence call to Jasmin Martin [place] Identifiers Verify spoke with patient she is past due on Lisinopril 20 mg she explain she takes 1 tablet daily and has filled her pill box for the week and has a prescription at the pharmacy and will pick up in a couple of days. Jasmin Martin is showing past due under [place].   [place] Management Direct Dial [phone]  Fax [phone] [email or web address]

## 2019-01-01 DIAGNOSIS — J449 Chronic obstructive pulmonary disease, unspecified: Secondary | ICD-10-CM | POA: Diagnosis not present

## 2019-01-01 DIAGNOSIS — I509 Heart failure, unspecified: Secondary | ICD-10-CM | POA: Diagnosis not present

## 2019-01-15 DIAGNOSIS — J449 Chronic obstructive pulmonary disease, unspecified: Secondary | ICD-10-CM | POA: Diagnosis not present

## 2019-01-15 DIAGNOSIS — I5032 Chronic diastolic (congestive) heart failure: Secondary | ICD-10-CM | POA: Diagnosis not present

## 2019-01-15 DIAGNOSIS — J9611 Chronic respiratory failure with hypoxia: Secondary | ICD-10-CM | POA: Diagnosis not present

## 2019-01-15 DIAGNOSIS — I482 Chronic atrial fibrillation, unspecified: Secondary | ICD-10-CM | POA: Diagnosis not present

## 2019-01-16 ENCOUNTER — Other Ambulatory Visit: Payer: Self-pay | Admitting: Pulmonary Disease

## 2019-01-16 ENCOUNTER — Other Ambulatory Visit (HOSPITAL_COMMUNITY): Payer: Self-pay | Admitting: Pulmonary Disease

## 2019-01-16 DIAGNOSIS — M7989 Other specified soft tissue disorders: Secondary | ICD-10-CM

## 2019-01-16 DIAGNOSIS — M79675 Pain in left toe(s): Secondary | ICD-10-CM

## 2019-01-21 ENCOUNTER — Ambulatory Visit (HOSPITAL_COMMUNITY)
Admission: RE | Admit: 2019-01-21 | Discharge: 2019-01-21 | Disposition: A | Discharge status: Home / Self Care | Payer: Medicare Other | Source: Ambulatory Visit | Attending: Pulmonary Disease | Admitting: Pulmonary Disease

## 2019-01-21 ENCOUNTER — Other Ambulatory Visit: Payer: Self-pay

## 2019-01-21 DIAGNOSIS — M7989 Other specified soft tissue disorders: Secondary | ICD-10-CM | POA: Diagnosis not present

## 2019-01-21 DIAGNOSIS — M79675 Pain in left toe(s): Secondary | ICD-10-CM | POA: Diagnosis not present

## 2019-01-21 DIAGNOSIS — R600 Localized edema: Secondary | ICD-10-CM | POA: Diagnosis not present

## 2019-01-27 ENCOUNTER — Other Ambulatory Visit: Payer: Self-pay

## 2019-01-27 ENCOUNTER — Ambulatory Visit (INDEPENDENT_AMBULATORY_CARE_PROVIDER_SITE_OTHER): Payer: Medicare Other | Admitting: Orthopaedic Surgery

## 2019-01-27 ENCOUNTER — Encounter: Payer: Self-pay | Admitting: Orthopaedic Surgery

## 2019-01-27 VITALS — BP 134/74 | HR 61 | Ht 63.0 in | Wt 340.0 lb

## 2019-01-27 DIAGNOSIS — Z7901 Long term (current) use of anticoagulants: Secondary | ICD-10-CM

## 2019-01-27 DIAGNOSIS — G8929 Other chronic pain: Secondary | ICD-10-CM

## 2019-01-27 DIAGNOSIS — Z6841 Body Mass Index (BMI) 40.0 and over, adult: Secondary | ICD-10-CM

## 2019-01-27 DIAGNOSIS — M79672 Pain in left foot: Secondary | ICD-10-CM | POA: Diagnosis not present

## 2019-01-27 NOTE — Progress Notes (Signed)
Subjective:    Patient ID: Jasmin SlateRuth J Negrette, female    DOB: Jan 11, 1956, 63 y.o.   MRN: [id number]  HPI She has pain in the left dorsal foot for many months. She has seen Dr. Juanetta GoslingHawkins and has had x-rays and MRI.  I have reviewed his notes and the x-ray and MRIs and notes.    MRI showed: IMPRESSION: 1. Marrow edema at the base and proximal shaft of the second, third and fifth metatarsals and to a lesser extent fourth metatarsal concerning for stress reaction. No evidence of a fracture. 2. Nonspecific soft tissue edema along the dorsal aspect of the foot most severe along the dorsal lateral aspect.  I have reviewed the findings with her. She has pain when standing. She denies trauma, denies redness or numbness.  It just hurts and swells.  She is on a blood thinner and cannot take any NSAIDs.  Review of Systems  Constitutional: Positive for activity change.  Cardiovascular: Positive for chest pain and palpitations.  Musculoskeletal: Positive for arthralgias, gait problem and joint swelling.  All other systems reviewed and are negative.  For Review of Systems, all other systems reviewed and are negative.  The following is a summary of the past history medically, past history surgically, known current medicines, social history and family history.  This information is gathered electronically by the computer from prior information and documentation.  I review this each visit and have found including this information at this point in the chart is beneficial and informative.   Past Medical History:  Diagnosis Date  . Arthritis   . Atrial fibrillation (HCC)    a. on Eliquis  . Bilateral knee pain   . CHF (congestive heart failure) (HCC)   . Depression   . DJD (degenerative joint disease)   . HTN (hypertension)   . Insomnia   . Obesity, morbid (HCC)   . Persistent atrial fibrillation (HCC)   . Renal insufficiency     Past Surgical History:  Procedure Laterality Date  . ABDOMINAL  HYSTERECTOMY    . bilateral knee surgery    . CESAREAN SECTION    . CHOLECYSTECTOMY  2001  . COLONOSCOPY  2007   Dr. Jena Gaussourk: friable anal canal, few scattered left-sided diverticula, benign colonic biopsies   . right ankle surgery    . TONSILLECTOMY    . VESICOVAGINAL FISTULA CLOSURE W/ TAH      Current Outpatient Medications on File Prior to Visit  Medication Sig Dispense Refill  . apixaban (ELIQUIS) 5 MG TABS tablet Take 5 mg by mouth 2 (two) times daily.    Marland Kitchen. diltiazem (CARDIZEM CD) 300 MG 24 hr capsule TAKE ONE CAPSULE BY MOUTH ONCE DAILY. 90 capsule 3  . lisinopril (ZESTRIL) 20 MG tablet TAKE ONE TABLET BY MOUTH ONCE DAILY. 90 tablet 2  . metoprolol (TOPROL-XL) 200 MG 24 hr tablet TAKE (1) TABLET BY MOUTH ONCE DAILY. 90 tablet 0  . oxyCODONE-acetaminophen (PERCOCET) 10-325 MG per tablet Take 1 tablet by mouth every 6 (six) hours.    . OXYGEN Inhale into the lungs. Wears at night only.    . pantoprazole (PROTONIX) 40 MG tablet TAKE 1 TABLET BY MOUTH 30 MINUTES BEFORE BREAKFAST. 90 tablet 1  . potassium gluconate 595 (99 K) MG TABS tablet Take 595 mg by mouth.    . pravastatin (PRAVACHOL) 40 MG tablet Take 40 mg by mouth daily.    Marland Kitchen. torsemide (DEMADEX) 100 MG tablet Take only if you gain weight of 3  pounds or develop edema (Patient taking differently: 50 mg. Take only if you gain weight of 3 pounds or develop edema)     No current facility-administered medications on file prior to visit.     Social History   Socioeconomic History  . Marital status: Divorced    Spouse name: Not on file  . Number of children: Not on file  . Years of education: Not on file  . Highest education level: Not on file  Occupational History  . Not on file  Social Needs  . Financial resource strain: Not on file  . Food insecurity    Worry: Not on file    Inability: Not on file  . Transportation needs    Medical: Not on file    Non-medical: Not on file  Tobacco Use  . Smoking status: Never Smoker   . Smokeless tobacco: Never Used  . Tobacco comment: Never smoked  Substance and Sexual Activity  . Alcohol use: No    Alcohol/week: 0.0 standard drinks  . Drug use: No  . Sexual activity: Yes    Birth control/protection: Surgical  Lifestyle  . Physical activity    Days per week: Not on file    Minutes per session: Not on file  . Stress: Not on file  Relationships  . Social Musician on phone: Not on file    Gets together: Not on file    Attends religious service: Not on file    Active member of club or organization: Not on file    Attends meetings of clubs or organizations: Not on file    Relationship status: Not on file  . Intimate partner violence    Fear of current or ex partner: Not on file    Emotionally abused: Not on file    Physically abused: Not on file    Forced sexual activity: Not on file  Other Topics Concern  . Not on file  Social History Narrative  . Not on file    Family History  Problem Relation Age of Onset  . Cancer Father        Lung   . CVA Son   . Colon cancer Neg Hx     BP 134/74   Pulse 61   Ht 5\' 3"  (1.6 m)   Wt (!) 340 lb (154.2 kg)   BMI 60.23 kg/m   Body mass index is 60.23 kg/m.     Objective:   Physical Exam Vitals signs reviewed.  Constitutional:      Appearance: She is well-developed.  HENT:     Head: Normocephalic and atraumatic.  Eyes:     Conjunctiva/sclera: Conjunctivae normal.     Pupils: Pupils are equal, round, and reactive to light.  Neck:     Musculoskeletal: Normal range of motion and neck supple.  Cardiovascular:     Rate and Rhythm: Normal rate and regular rhythm.  Pulmonary:     Effort: Pulmonary effort is normal.  Abdominal:     Palpations: Abdomen is soft.  Musculoskeletal:     Left foot: Tenderness and swelling present.       Feet:  Skin:    General: Skin is warm and dry.  Neurological:     Mental Status: She is alert and oriented to person, place, and time.     Cranial Nerves: No  cranial nerve deficit.     Motor: No abnormal muscle tone.     Coordination: Coordination normal.  Deep Tendon Reflexes: Reflexes are normal and symmetric. Reflexes normal.  Psychiatric:        Behavior: Behavior normal.        Thought Content: Thought content normal.        Judgment: Judgment normal.           Assessment & Plan:   Encounter Diagnoses  Name Primary?  . Chronic foot pain, left Yes  . Body mass index 60.0-69.9, adult ([place])   . Morbid obesity ([place])   . Chronic anticoagulation    I will give CAM walker and fitted it on her.  I have told her to do contrast baths, instruction sheet given.  I have told her to use Aspercreme, BioFreeze or Voltaren Gel.  Return in three weeks.  Call if any problem.  Precautions discussed.   Electronically Signed Sanjuana Kava, MD 10/27/202011:16 AM

## 2019-02-01 DIAGNOSIS — I509 Heart failure, unspecified: Secondary | ICD-10-CM | POA: Diagnosis not present

## 2019-02-01 DIAGNOSIS — J449 Chronic obstructive pulmonary disease, unspecified: Secondary | ICD-10-CM | POA: Diagnosis not present

## 2019-02-06 ENCOUNTER — Other Ambulatory Visit: Payer: Self-pay

## 2019-02-06 NOTE — Patient Outreach (Signed)
[facility]) Care Management  02/06/2019  Jasmin Martin 1955/07/14 [id number]   Medication Adherence call to Jasmin Martin [facility] Identifiers Verify spoke with patient she is past due on Pravastatin 40 mg,patient explain she takes 1 tablet daily and has medication at this time,patient explain sometimes she receives samples from her doctors office reason why she has medication. Jasmin Martin is showing past due under [place].    [facility] Management Direct Dial [phone]  Fax [phone] [email or web address]

## 2019-02-17 ENCOUNTER — Other Ambulatory Visit: Payer: Self-pay

## 2019-02-17 ENCOUNTER — Ambulatory Visit: Payer: Medicare Other | Admitting: Orthopaedic Surgery

## 2019-02-17 ENCOUNTER — Encounter: Payer: Self-pay | Admitting: Orthopaedic Surgery

## 2019-02-17 VITALS — BP 136/68 | HR 69 | Temp 97.5°F | Ht 63.0 in | Wt 343.0 lb

## 2019-02-17 DIAGNOSIS — G8929 Other chronic pain: Secondary | ICD-10-CM | POA: Diagnosis not present

## 2019-02-17 DIAGNOSIS — Z6841 Body Mass Index (BMI) 40.0 and over, adult: Secondary | ICD-10-CM

## 2019-02-17 DIAGNOSIS — J449 Chronic obstructive pulmonary disease, unspecified: Secondary | ICD-10-CM | POA: Diagnosis not present

## 2019-02-17 DIAGNOSIS — I482 Chronic atrial fibrillation, unspecified: Secondary | ICD-10-CM | POA: Diagnosis not present

## 2019-02-17 DIAGNOSIS — M79672 Pain in left foot: Secondary | ICD-10-CM | POA: Diagnosis not present

## 2019-02-17 DIAGNOSIS — Z23 Encounter for immunization: Secondary | ICD-10-CM | POA: Diagnosis not present

## 2019-02-17 DIAGNOSIS — M545 Low back pain: Secondary | ICD-10-CM | POA: Diagnosis not present

## 2019-02-17 DIAGNOSIS — I5032 Chronic diastolic (congestive) heart failure: Secondary | ICD-10-CM | POA: Diagnosis not present

## 2019-02-17 NOTE — Progress Notes (Signed)
Patient VO:HYWV Jasmin Martin, female DOB:01/27/1956, 63 y.o. PXT:[id number]  Chief Complaint  Patient presents with  . Foot Pain    HPI  Jasmin Martin is a 63 y.o. female who has chronic pain in the left foot mid foot. She had MRI which showed some early stress reaction of the middle metatarsals.  Most of her pain is late in the day and at night.  She has swelling then. She has no redness, no new trauma.  Overall she is a little better.  She uses a walker.   Body mass index is 60.76 kg/m.  The patient meets the AMA guidelines for Morbid (severe) obesity with a BMI > 40.0 and I have recommended weight loss.   ROS  Review of Systems  Constitutional: Positive for activity change.  Cardiovascular: Positive for chest pain and palpitations.  Musculoskeletal: Positive for arthralgias, gait problem and joint swelling.  All other systems reviewed and are negative.   All other systems reviewed and are negative.  The following is a summary of the past history medically, past history surgically, known current medicines, social history and family history.  This information is gathered electronically by the computer from prior information and documentation.  I review this each visit and have found including this information at this point in the chart is beneficial and informative.    Past Medical History:  Diagnosis Date  . Arthritis   . Atrial fibrillation ([place])    a. on Eliquis  . Bilateral knee pain   . CHF (congestive heart failure) ([place])   . Depression   . DJD (degenerative joint disease)   . HTN (hypertension)   . Insomnia   . Obesity, morbid ([place])   . Persistent atrial fibrillation ([place])   . Renal insufficiency     Past Surgical History:  Procedure Laterality Date  . ABDOMINAL HYSTERECTOMY    . bilateral knee surgery    . CESAREAN SECTION    . CHOLECYSTECTOMY  2001  . COLONOSCOPY  2007   Dr. Gala Romney: friable anal canal, few scattered left-sided diverticula, benign colonic  biopsies   . right ankle surgery    . TONSILLECTOMY    . VESICOVAGINAL FISTULA CLOSURE W/ TAH      Family History  Problem Relation Age of Onset  . Cancer Father        Lung   . CVA Son   . Colon cancer Neg Hx     Social History Social History   Tobacco Use  . Smoking status: Never Smoker  . Smokeless tobacco: Never Used  . Tobacco comment: Never smoked  Substance Use Topics  . Alcohol use: No    Alcohol/week: 0.0 standard drinks  . Drug use: No    No Known Allergies  Current Outpatient Medications  Medication Sig Dispense Refill  . apixaban (ELIQUIS) 5 MG TABS tablet Take 5 mg by mouth 2 (two) times daily.    Marland Kitchen diltiazem (CARDIZEM CD) 300 MG 24 hr capsule TAKE ONE CAPSULE BY MOUTH ONCE DAILY. 90 capsule 3  . lisinopril (ZESTRIL) 20 MG tablet TAKE ONE TABLET BY MOUTH ONCE DAILY. 90 tablet 2  . metoprolol (TOPROL-XL) 200 MG 24 hr tablet TAKE (1) TABLET BY MOUTH ONCE DAILY. 90 tablet 0  . oxyCODONE-acetaminophen (PERCOCET) 10-325 MG per tablet Take 1 tablet by mouth every 6 (six) hours.    . OXYGEN Inhale into the lungs. Wears at night only.    . pantoprazole (PROTONIX) 40 MG tablet TAKE 1 TABLET BY  MOUTH 30 MINUTES BEFORE BREAKFAST. 90 tablet 1  . potassium gluconate 595 (99 K) MG TABS tablet Take 595 mg by mouth.    . pravastatin (PRAVACHOL) 40 MG tablet Take 40 mg by mouth daily.    Marland Kitchen torsemide (DEMADEX) 100 MG tablet Take only if you gain weight of 3 pounds or develop edema (Patient taking differently: 50 mg. Take only if you gain weight of 3 pounds or develop edema)     No current facility-administered medications for this visit.      Physical Exam  Blood pressure 136/68, pulse 69, temperature (!) 97.5 F (36.4 C), height 5\' 3"  (1.6 m), weight (!) 343 lb (155.6 kg).  Constitutional: overall normal hygiene, normal nutrition, well developed, normal grooming, normal body habitus. Assistive device:walker  Musculoskeletal: gait and station Limp left, muscle tone  and strength are normal, no tremors or atrophy is present.  .  Neurological: coordination overall normal.  Deep tendon reflex/nerve stretch intact.  Sensation normal.  Cranial nerves II-XII intact.   Skin:   Normal overall no scars, lesions, ulcers or rashes. No psoriasis.  Psychiatric: Alert and oriented x 3.  Recent memory intact, remote memory unclear.  Normal mood and affect. Well groomed.  Good eye contact.  Cardiovascular: overall no swelling, no varicosities, no edema bilaterally, normal temperatures of the legs and arms, no clubbing, cyanosis and good capillary refill.  Lymphatic: palpation is normal.  Left dorsum of the foot is tender over the second, third and fourth metatarsal head area.  She has no swelling.  NV intact.  Limp left.  All other systems reviewed and are negative   The patient has been educated about the nature of the problem(s) and counseled on treatment options.  The patient appeared to understand what I have discussed and is in agreement with it.  Encounter Diagnoses  Name Primary?  . Chronic foot pain, left Yes  . Body mass index 60.0-69.9, adult (HCC)   . Morbid obesity (HCC)     PLAN Call if any problems.  Precautions discussed.  Continue current medications.   Return to clinic 2 months   Electronically Signed 10-07-1997, MD 11/17/20209:05 AM

## 2019-02-21 ENCOUNTER — Other Ambulatory Visit: Payer: Self-pay | Admitting: Cardiology

## 2019-03-03 DIAGNOSIS — J449 Chronic obstructive pulmonary disease, unspecified: Secondary | ICD-10-CM | POA: Diagnosis not present

## 2019-03-03 DIAGNOSIS — I509 Heart failure, unspecified: Secondary | ICD-10-CM | POA: Diagnosis not present

## 2019-04-03 DIAGNOSIS — I509 Heart failure, unspecified: Secondary | ICD-10-CM | POA: Diagnosis not present

## 2019-04-03 DIAGNOSIS — J449 Chronic obstructive pulmonary disease, unspecified: Secondary | ICD-10-CM | POA: Diagnosis not present

## 2019-04-11 ENCOUNTER — Other Ambulatory Visit: Payer: Self-pay | Admitting: Cardiology

## 2019-04-16 ENCOUNTER — Ambulatory Visit: Payer: Medicare Other | Admitting: Orthopaedic Surgery

## 2019-04-16 DIAGNOSIS — Z79899 Other long term (current) drug therapy: Secondary | ICD-10-CM | POA: Diagnosis not present

## 2019-04-16 DIAGNOSIS — J449 Chronic obstructive pulmonary disease, unspecified: Secondary | ICD-10-CM | POA: Diagnosis not present

## 2019-04-16 DIAGNOSIS — I10 Essential (primary) hypertension: Secondary | ICD-10-CM | POA: Diagnosis not present

## 2019-04-16 DIAGNOSIS — I5032 Chronic diastolic (congestive) heart failure: Secondary | ICD-10-CM | POA: Diagnosis not present

## 2019-04-16 DIAGNOSIS — I482 Chronic atrial fibrillation, unspecified: Secondary | ICD-10-CM | POA: Diagnosis not present

## 2019-04-28 DIAGNOSIS — G4700 Insomnia, unspecified: Secondary | ICD-10-CM | POA: Diagnosis not present

## 2019-04-28 DIAGNOSIS — M545 Low back pain: Secondary | ICD-10-CM | POA: Diagnosis not present

## 2019-04-28 DIAGNOSIS — M25561 Pain in right knee: Secondary | ICD-10-CM | POA: Diagnosis not present

## 2019-05-04 DIAGNOSIS — J449 Chronic obstructive pulmonary disease, unspecified: Secondary | ICD-10-CM | POA: Diagnosis not present

## 2019-05-04 DIAGNOSIS — I509 Heart failure, unspecified: Secondary | ICD-10-CM | POA: Diagnosis not present

## 2019-05-12 DIAGNOSIS — E785 Hyperlipidemia, unspecified: Secondary | ICD-10-CM | POA: Diagnosis not present

## 2019-05-12 DIAGNOSIS — E039 Hypothyroidism, unspecified: Secondary | ICD-10-CM | POA: Diagnosis not present

## 2019-05-12 DIAGNOSIS — I10 Essential (primary) hypertension: Secondary | ICD-10-CM | POA: Diagnosis not present

## 2019-05-12 DIAGNOSIS — Z79899 Other long term (current) drug therapy: Secondary | ICD-10-CM | POA: Diagnosis not present

## 2019-05-19 DIAGNOSIS — Z0001 Encounter for general adult medical examination with abnormal findings: Secondary | ICD-10-CM | POA: Diagnosis not present

## 2019-05-19 DIAGNOSIS — M545 Low back pain: Secondary | ICD-10-CM | POA: Diagnosis not present

## 2019-05-19 DIAGNOSIS — I10 Essential (primary) hypertension: Secondary | ICD-10-CM | POA: Diagnosis not present

## 2019-05-19 DIAGNOSIS — E119 Type 2 diabetes mellitus without complications: Secondary | ICD-10-CM | POA: Diagnosis not present

## 2019-05-26 ENCOUNTER — Other Ambulatory Visit: Payer: Self-pay | Admitting: Student

## 2019-05-26 ENCOUNTER — Other Ambulatory Visit: Payer: Self-pay | Admitting: Nurse Practitioner

## 2019-05-27 NOTE — Telephone Encounter (Signed)
Please tell the patient we haven't seen her in over 3 years. She'll need a follow-up OV for further refills.

## 2019-05-27 NOTE — Telephone Encounter (Signed)
Please schedule OV for further refills.

## 2019-05-28 NOTE — Telephone Encounter (Signed)
Pt aware of OV on Tuesday

## 2019-06-01 DIAGNOSIS — I509 Heart failure, unspecified: Secondary | ICD-10-CM | POA: Diagnosis not present

## 2019-06-01 DIAGNOSIS — J449 Chronic obstructive pulmonary disease, unspecified: Secondary | ICD-10-CM | POA: Diagnosis not present

## 2019-06-02 ENCOUNTER — Ambulatory Visit: Payer: Medicare Other | Admitting: Gastroenterology

## 2019-06-03 ENCOUNTER — Telehealth: Payer: Self-pay | Admitting: Cardiology

## 2019-06-03 NOTE — Telephone Encounter (Signed)
Virtual Visit Pre-Appointment Phone Call  "(Name), I am calling you today to discuss your upcoming appointment. We are currently trying to limit exposure to the virus that causes COVID-19 by seeing patients at home rather than in the office."  1. "What is the BEST phone number to call the day of the visit?"[phone] 2.   3. "Do you have or have access to (through a family member/friend) a smartphone with video capability that we can use for your visit?" a. If yes - list this number in appt notes as "cell" (if different from BEST phone #) and list the appointment type as a VIDEO visit in appointment notes b. If no - list the appointment type as a PHONE visit in appointment notes  4. Confirm consent - "In the setting of the current Covid19 crisis, you are scheduled for a (phone or video) visit with your provider on (date) at (time).  Just as we do with many in-office visits, in order for you to participate in this visit, we must obtain consent.  If you'd like, I can send this to your mychart (if signed up) or email for you to review.  Otherwise, I can obtain your verbal consent now.  All virtual visits are billed to your insurance company just like a normal visit would be.  By agreeing to a virtual visit, we'd like you to understand that the technology does not allow for your provider to perform an examination, and thus may limit your provider's ability to fully assess your condition. If your provider identifies any concerns that need to be evaluated in person, we will make arrangements to do so.  Finally, though the technology is pretty good, we cannot assure that it will always work on either your or our end, and in the setting of a video visit, we may have to convert it to a phone-only visit.  In either situation, we cannot ensure that we have a secure connection.  Are you willing to proceed?" STAFF: Did the patient verbally acknowledge consent to telehealth visit? Document YES/NO here:  YES  5. Advise patient to be prepared - "Two hours prior to your appointment, go ahead and check your blood pressure, pulse, oxygen saturation, and your weight (if you have the equipment to check those) and write them all down. When your visit starts, your provider will ask you for this information. If you have an Apple Watch or Kardia device, please plan to have heart rate information ready on the day of your appointment. Please have a pen and paper handy nearby the day of the visit as well."  6. Give patient instructions for MyChart download to smartphone OR [email or web address] as below if video visit (depending on what platform provider is using)  7. Inform patient they will receive a phone call 15 minutes prior to their appointment time (may be from unknown caller ID) so they should be prepared to answer    TELEPHONE CALL NOTE  Jasmin Martin has been deemed a candidate for a follow-up tele-health visit to limit community exposure during the Covid-19 pandemic. I spoke with the patient via phone to ensure availability of phone/video source, confirm preferred email & phone number, and discuss instructions and expectations.  I reminded Jasmin Martin to be prepared with any vital sign and/or heart rhythm information that could potentially be obtained via home monitoring, at the time of her visit. I reminded Jasmin Martin to expect a phone call prior to her visit.  Jasmin Martin  Jasmin Martin 06/03/2019 4:08 PM   INSTRUCTIONS FOR DOWNLOADING THE MYCHART APP TO SMARTPHONE  - The patient must first make sure to have activated MyChart and know their login information - If Apple, go to [employer] and type in MyChart in the search bar and download the app. If Android, ask patient to go to [employer] and type in [place] in the search bar and download the app. The app is free but as with any other app downloads, their phone may require them to verify saved payment information or Apple/Android password.  - The  patient will need to then log into the app with their MyChart username and password, and select [facility] as their healthcare provider to link the account. When it is time for your visit, go to the MyChart app, find appointments, and click Begin Video Visit. Be sure to Select Allow for your device to access the Microphone and Camera for your visit. You will then be connected, and your provider will be with you shortly.  **If they have any issues connecting, or need assistance please contact MyChart service desk [phone])**  **If using a computer, in order to ensure the best quality for their visit they will need to use either of the following Internet Browsers: [employer], or Google Chrome**  IF USING DOXIMITY or DOXY.ME - The patient will receive a link just prior to their visit by text.     FULL LENGTH CONSENT FOR TELE-HEALTH VISIT   I hereby voluntarily request, consent and authorize [facility] and its employed or contracted physicians, physician assistants, nurse practitioners or other licensed health care professionals (the Practitioner), to provide me with telemedicine health care services (the "Services") as deemed necessary by the treating Practitioner. I acknowledge and consent to receive the Services by the Practitioner via telemedicine. I understand that the telemedicine visit will involve communicating with the Practitioner through live audiovisual communication technology and the disclosure of certain medical information by electronic transmission. I acknowledge that I have been given the opportunity to request an in-person assessment or other available alternative prior to the telemedicine visit and am voluntarily participating in the telemedicine visit.  I understand that I have the right to withhold or withdraw my consent to the use of telemedicine in the course of my care at any time, without affecting my right to future care or treatment, and that the  Practitioner or I may terminate the telemedicine visit at any time. I understand that I have the right to inspect all information obtained and/or recorded in the course of the telemedicine visit and may receive copies of available information for a reasonable fee.  I understand that some of the potential risks of receiving the Services via telemedicine include:  Marland Kitchen Delay or interruption in medical evaluation due to technological equipment failure or disruption; . Information transmitted may not be sufficient (e.g. poor resolution of images) to allow for appropriate medical decision making by the Practitioner; and/or  . In rare instances, security protocols could fail, causing a breach of personal health information.  Furthermore, I acknowledge that it is my responsibility to provide information about my medical history, conditions and care that is complete and accurate to the best of my ability. I acknowledge that Practitioner's advice, recommendations, and/or decision may be based on factors not within their control, such as incomplete or inaccurate data provided by me or distortions of diagnostic images or specimens that may result from electronic transmissions. I understand that the practice  of medicine is not an Chief Strategy Officer and that Practitioner makes no warranties or guarantees regarding treatment outcomes. I acknowledge that I will receive a copy of this consent concurrently upon execution via email to the email address I last provided but may also request a printed copy by calling the office of [facility].    I understand that my insurance will be billed for this visit.   I have read or had this consent read to me. . I understand the contents of this consent, which adequately explains the benefits and risks of the Services being provided via telemedicine.  . I have been provided ample opportunity to ask questions regarding this consent and the Services and have had my questions answered to my  satisfaction. . I give my informed consent for the services to be provided through the use of telemedicine in my medical care  By participating in this telemedicine visit I agree to the above.

## 2019-06-04 ENCOUNTER — Ambulatory Visit: Payer: Medicare Other | Attending: Internal Medicine

## 2019-06-04 DIAGNOSIS — Z23 Encounter for immunization: Secondary | ICD-10-CM | POA: Insufficient documentation

## 2019-06-04 NOTE — Progress Notes (Signed)
   Covid-19 Vaccination Clinic  Name:  BRITNE BORELLI    MRN: [id number] DOB: [birth year]  06/04/2019  Ms. Blasco was observed post Covid-19 immunization for 15 minutes without incident. She was provided with Vaccine Information Sheet and instruction to access the V-Safe system.   Ms. Akers was instructed to call 911 with any severe reactions post vaccine: Marland Kitchen Difficulty breathing  . Swelling of face and throat  . A fast heartbeat  . A bad rash all over body  . Dizziness and weakness   Immunizations Administered    Name Date Dose VIS Date Route   Moderna COVID-19 Vaccine 06/04/2019 10:16 AM 0.5 mL 03/03/2019 Intramuscular   Manufacturer: Moderna   Lot: [id number]   NDC: 50569-794-80

## 2019-06-15 ENCOUNTER — Encounter: Payer: Self-pay | Admitting: Internal Medicine

## 2019-06-15 ENCOUNTER — Ambulatory Visit: Payer: Medicare Other | Admitting: Gastroenterology

## 2019-06-15 ENCOUNTER — Telehealth: Payer: Self-pay | Admitting: Internal Medicine

## 2019-06-15 NOTE — Telephone Encounter (Signed)
PATIENT WAS A NO SHOW AND LETTER SENT  °

## 2019-06-16 DIAGNOSIS — M25562 Pain in left knee: Secondary | ICD-10-CM | POA: Diagnosis not present

## 2019-06-16 DIAGNOSIS — M25561 Pain in right knee: Secondary | ICD-10-CM | POA: Diagnosis not present

## 2019-06-16 DIAGNOSIS — E119 Type 2 diabetes mellitus without complications: Secondary | ICD-10-CM | POA: Diagnosis not present

## 2019-06-16 DIAGNOSIS — Z79891 Long term (current) use of opiate analgesic: Secondary | ICD-10-CM | POA: Diagnosis not present

## 2019-06-16 DIAGNOSIS — E785 Hyperlipidemia, unspecified: Secondary | ICD-10-CM | POA: Diagnosis not present

## 2019-06-16 DIAGNOSIS — I10 Essential (primary) hypertension: Secondary | ICD-10-CM | POA: Diagnosis not present

## 2019-06-24 ENCOUNTER — Other Ambulatory Visit: Payer: Self-pay

## 2019-06-24 ENCOUNTER — Telehealth (INDEPENDENT_AMBULATORY_CARE_PROVIDER_SITE_OTHER): Payer: Medicare Other | Admitting: Cardiology

## 2019-06-24 ENCOUNTER — Encounter: Payer: Self-pay | Admitting: Cardiology

## 2019-06-24 VITALS — BP 125/79 | Ht 64.0 in | Wt 330.0 lb

## 2019-06-24 DIAGNOSIS — I4891 Unspecified atrial fibrillation: Secondary | ICD-10-CM

## 2019-06-24 DIAGNOSIS — R600 Localized edema: Secondary | ICD-10-CM

## 2019-06-24 DIAGNOSIS — I10 Essential (primary) hypertension: Secondary | ICD-10-CM

## 2019-06-24 NOTE — Progress Notes (Signed)
Virtual Visit via Telephone Note   This visit type was conducted due to national recommendations for restrictions regarding the COVID-19 Pandemic (e.g. social distancing) in an effort to limit this patient's exposure and mitigate transmission in our community.  Due to her co-morbid illnesses, this patient is at least at moderate risk for complications without adequate follow up.  This format is felt to be most appropriate for this patient at this time.  The patient did not have access to video technology/had technical difficulties with video requiring transitioning to audio format only (telephone).  All issues noted in this document were discussed and addressed.  No physical exam could be performed with this format.  Please refer to the patient's chart for her  consent to telehealth for [facility].   The patient was identified using 2 identifiers.  Date:  06/24/2019   ID:  Jasmin, Martin Jan 20, 1956, MRN [id number]  Patient Location: Home Provider Location: Office  PCP:  [name], MD  Cardiologist:  [name], MD  Electrophysiologist:  None   Evaluation Performed:  Follow-Up Visit  Chief Complaint:  Follow up visit  History of Present Illness:    Jasmin Martin is a 64 y.o. female seen today for follow up of the following medical problem.s   1. Afib  - no recent palpitations.  - compliant with meds. No bleeding on eliquis.   2. HTN - she is compliant with meds  3. Nocturnal hypoxia - uses home O2 at night, reports negative sleep study for OSA   4. LE edema - takes torsemide every 2-3 days.  - swelling is controlled  SH: son passed away 03/02/17 Had first vaccine [facility] de[facility].     The patient does not have symptoms concerning for COVID-19 infection (fever, chills, cough, or new shortness of breath).    Past Medical History:  Diagnosis Date  . Arthritis   . Atrial fibrillation ([place])    a. on Eliquis  . Bilateral knee pain   .  CHF (congestive heart failure) ([place])   . Depression   . DJD (degenerative joint disease)   . HTN (hypertension)   . Insomnia   . Obesity, morbid ([place])   . Persistent atrial fibrillation ([place])   . Renal insufficiency    Past Surgical History:  Procedure Laterality Date  . ABDOMINAL HYSTERECTOMY    . bilateral knee surgery    . CESAREAN SECTION    . CHOLECYSTECTOMY  2001  . COLONOSCOPY  2007   Dr. Gala Romney: friable anal canal, few scattered left-sided diverticula, benign colonic biopsies   . right ankle surgery    . TONSILLECTOMY    . VESICOVAGINAL FISTULA CLOSURE W/ TAH       Current Meds  Medication Sig  . apixaban (ELIQUIS) 5 MG TABS tablet Take 5 mg by mouth 2 (two) times daily.  Marland Kitchen diltiazem (CARDIZEM CD) 300 MG 24 hr capsule TAKE ONE CAPSULE BY MOUTH ONCE DAILY.  Marland Kitchen lisinopril (ZESTRIL) 20 MG tablet TAKE ONE TABLET BY MOUTH ONCE DAILY.  . metoprolol (TOPROL-XL) 200 MG 24 hr tablet TAKE (1) TABLET BY MOUTH ONCE DAILY.  Marland Kitchen oxyCODONE-acetaminophen (PERCOCET) 10-325 MG per tablet Take 1 tablet by mouth every 6 (six) hours.  . OXYGEN Inhale into the lungs. Wears at night only.  . pantoprazole (PROTONIX) 40 MG tablet TAKE 1 TABLET BY MOUTH 30 MINUTES BEFORE BREAKFAST.  Marland Kitchen potassium gluconate 595 (99 K) MG TABS tablet Take 595 mg by mouth.  . pravastatin (  PRAVACHOL) 40 MG tablet Take 40 mg by mouth daily.  Marland Kitchen torsemide (DEMADEX) 100 MG tablet Take only if you gain weight of 3 pounds or develop edema (Patient taking differently: 50 mg. Take only if you gain weight of 3 pounds or develop edema)     Allergies:   Patient has no known allergies.   Social History   Tobacco Use  . Smoking status: Never Smoker  . Smokeless tobacco: Never Used  . Tobacco comment: Never smoked  Substance Use Topics  . Alcohol use: No    Alcohol/week: 0.0 standard drinks  . Drug use: No     Family Hx: The patient's family history includes CVA in her son; Cancer in her father. There is no history of Colon  cancer.  ROS:   Please see the history of present illness.     All other systems reviewed and are negative.   Prior CV studies:   The following studies were reviewed today:  07/14/11 Echo: LVEF 50-55%, mild LVH, mod LAE  06/2014 MPI IMPRESSION: 1. No reversible ischemia or infarction. Hypertensive response to stress.  2. Normal left ventricular wall motion.  3. Left ventricular ejection fraction 30%. However, this is likely due to gating abnormalities as echocardiogram on 06/17/14 reported normal LV systolic function, LVEF 55-60%. Function appears grossly normal on nuclear images.  4. Low-risk stress test findings*.  06/2014 echo Study Conclusions  - Left ventricle: Systolic function was normal. The estimated ejection fraction was in the range of 55% to 60%. - Aortic valve: Mildly calcified annulus. Trileaflet; mildly thickened leaflets. Valve area (VTI): 1.52 cm^2. - Mitral valve: Mildly calcified annulus. - Left atrium: The atrium was severely dilated. - Right ventricle: The cavity size was mildly dilated. - Right atrium: The atrium was severely dilated. - Atrial septum: There was a patent foramen ovale. - Technically adequate study.   Labs/Other Tests and Data Reviewed:    EKG:  No ECG reviewed.  Recent Labs: 12/21/2018: BUN 15; Creatinine, Ser 0.95; Hemoglobin 14.5; Platelets 233; Potassium 3.9; Sodium 138   Recent Lipid Panel Lab Results  Component Value Date/Time   CHOL 169 11/03/2014 07:12 AM   TRIG 193 (H) 11/03/2014 07:12 AM   HDL 40 (L) 11/03/2014 07:12 AM   CHOLHDL 4.2 11/03/2014 07:12 AM   LDLCALC 90 11/03/2014 07:12 AM    Wt Readings from Last 3 Encounters:  06/24/19 (!) 330 lb (149.7 kg)  02/17/19 (!) 343 lb (155.6 kg)  01/27/19 (!) 340 lb (154.2 kg)     Objective:    Vital Signs:  BP 125/79   Ht 5\' 4"  (1.626 m)   Wt (!) 330 lb (149.7 kg)   BMI 56.64 kg/m    Normal affect. Normal speech pattern and tone. Comfortable, no  apparent distress. No audible signs of SOB or wheezing.   ASSESSMENT & PLAN:   1. Afib - no symptoms, continue current meds including anticoag  2. HTN -bp at goal,continue current meds   3. LE edema - doing well with prn torsemide, continue current regimen  Request labs from [facility]  F/u 6 monts in clinic   COVID-19 Education: The signs and symptoms of COVID-19 were discussed with the patient and how to seek care for testing (follow up with PCP or arrange E-visit).  The importance of social distancing was discussed today.  Time:   Today, I have spent 23 minutes with the patient with telehealth technology discussing the above problems.     Medication Adjustments/Labs  and Tests Ordered: Current medicines are reviewed at length with the patient today.  Concerns regarding medicines are outlined above.   Tests Ordered: No orders of the defined types were placed in this encounter.   Medication Changes: No orders of the defined types were placed in this encounter.   Follow Up:  In Person in 6 month(s)  Signed, Dina Rich, MD  06/24/2019 1:44 PM    [facility]

## 2019-06-24 NOTE — Patient Instructions (Signed)

## 2019-07-02 DIAGNOSIS — Z79891 Long term (current) use of opiate analgesic: Secondary | ICD-10-CM | POA: Diagnosis not present

## 2019-07-02 DIAGNOSIS — J449 Chronic obstructive pulmonary disease, unspecified: Secondary | ICD-10-CM | POA: Diagnosis not present

## 2019-07-02 DIAGNOSIS — I509 Heart failure, unspecified: Secondary | ICD-10-CM | POA: Diagnosis not present

## 2019-07-07 ENCOUNTER — Ambulatory Visit: Payer: Medicare Other | Attending: Internal Medicine

## 2019-07-07 DIAGNOSIS — Z23 Encounter for immunization: Secondary | ICD-10-CM

## 2019-07-07 NOTE — Progress Notes (Signed)
   Covid-19 Vaccination Clinic  Name:  Jasmin Martin    MRN: [id number] DOB: 1956/03/28  07/07/2019  Jasmin Martin was observed post Covid-19 immunization for 15 minutes without incident. She was provided with Vaccine Information Sheet and instruction to access the V-Safe system.   Jasmin Martin was instructed to call 911 with any severe reactions post vaccine: Marland Kitchen Difficulty breathing  . Swelling of face and throat  . A fast heartbeat  . A bad rash all over body  . Dizziness and weakness   Immunizations Administered    Name Date Dose VIS Date Route   Moderna COVID-19 Vaccine 07/07/2019  8:48 AM 0.5 mL 03/03/2019 Intramuscular   Manufacturer: Moderna   Lot: [id number]   NDC: 62263-335-45

## 2019-07-10 ENCOUNTER — Other Ambulatory Visit: Payer: Self-pay | Admitting: Student

## 2019-07-10 NOTE — Telephone Encounter (Signed)
Can someone please see if this pt's medication was sent to [facility]?  If it's called in in the next little bit it can be delivered today

## 2019-07-16 DIAGNOSIS — M25562 Pain in left knee: Secondary | ICD-10-CM | POA: Diagnosis not present

## 2019-07-16 DIAGNOSIS — I10 Essential (primary) hypertension: Secondary | ICD-10-CM | POA: Diagnosis not present

## 2019-07-16 DIAGNOSIS — M25561 Pain in right knee: Secondary | ICD-10-CM | POA: Diagnosis not present

## 2019-07-16 DIAGNOSIS — M545 Low back pain: Secondary | ICD-10-CM | POA: Diagnosis not present

## 2019-07-16 DIAGNOSIS — E119 Type 2 diabetes mellitus without complications: Secondary | ICD-10-CM | POA: Diagnosis not present

## 2019-08-01 DIAGNOSIS — I509 Heart failure, unspecified: Secondary | ICD-10-CM | POA: Diagnosis not present

## 2019-08-01 DIAGNOSIS — J449 Chronic obstructive pulmonary disease, unspecified: Secondary | ICD-10-CM | POA: Diagnosis not present

## 2019-08-02 DIAGNOSIS — Z79891 Long term (current) use of opiate analgesic: Secondary | ICD-10-CM | POA: Diagnosis not present

## 2019-08-10 DIAGNOSIS — E785 Hyperlipidemia, unspecified: Secondary | ICD-10-CM | POA: Diagnosis not present

## 2019-08-10 DIAGNOSIS — M25561 Pain in right knee: Secondary | ICD-10-CM | POA: Diagnosis not present

## 2019-08-10 DIAGNOSIS — E119 Type 2 diabetes mellitus without complications: Secondary | ICD-10-CM | POA: Diagnosis not present

## 2019-08-10 DIAGNOSIS — I10 Essential (primary) hypertension: Secondary | ICD-10-CM | POA: Diagnosis not present

## 2019-08-11 ENCOUNTER — Other Ambulatory Visit: Payer: Self-pay | Admitting: Cardiology

## 2019-08-13 DIAGNOSIS — M5441 Lumbago with sciatica, right side: Secondary | ICD-10-CM | POA: Diagnosis not present

## 2019-08-13 DIAGNOSIS — M5442 Lumbago with sciatica, left side: Secondary | ICD-10-CM | POA: Diagnosis not present

## 2019-08-13 DIAGNOSIS — I482 Chronic atrial fibrillation, unspecified: Secondary | ICD-10-CM | POA: Diagnosis not present

## 2019-08-13 DIAGNOSIS — I10 Essential (primary) hypertension: Secondary | ICD-10-CM | POA: Diagnosis not present

## 2019-08-13 DIAGNOSIS — I5032 Chronic diastolic (congestive) heart failure: Secondary | ICD-10-CM | POA: Diagnosis not present

## 2019-08-28 ENCOUNTER — Other Ambulatory Visit: Payer: Self-pay

## 2019-08-28 MED ORDER — METOPROLOL SUCCINATE ER 200 MG PO TB24: ORAL_TABLET | ORAL | 3 refills | Status: DC

## 2019-08-28 NOTE — Telephone Encounter (Signed)
Refilled metoprolol 

## 2019-08-31 ENCOUNTER — Other Ambulatory Visit: Payer: Self-pay | Admitting: Nurse Practitioner

## 2019-09-01 DIAGNOSIS — M545 Low back pain: Secondary | ICD-10-CM | POA: Diagnosis not present

## 2019-09-01 DIAGNOSIS — J449 Chronic obstructive pulmonary disease, unspecified: Secondary | ICD-10-CM | POA: Diagnosis not present

## 2019-09-01 DIAGNOSIS — I509 Heart failure, unspecified: Secondary | ICD-10-CM | POA: Diagnosis not present

## 2019-09-02 NOTE — Telephone Encounter (Signed)
Patient hasn't been seen since 2017. She will need OV for further refills.

## 2019-09-03 NOTE — Telephone Encounter (Signed)
Pt did not want to make ov.  Said that she would get her PCP to address RX.

## 2019-09-03 NOTE — Telephone Encounter (Signed)
Noted  

## 2019-09-16 DIAGNOSIS — M5442 Lumbago with sciatica, left side: Secondary | ICD-10-CM | POA: Diagnosis not present

## 2019-09-16 DIAGNOSIS — E119 Type 2 diabetes mellitus without complications: Secondary | ICD-10-CM | POA: Diagnosis not present

## 2019-09-16 DIAGNOSIS — M5441 Lumbago with sciatica, right side: Secondary | ICD-10-CM | POA: Diagnosis not present

## 2019-09-16 DIAGNOSIS — I482 Chronic atrial fibrillation, unspecified: Secondary | ICD-10-CM | POA: Diagnosis not present

## 2019-09-16 DIAGNOSIS — I10 Essential (primary) hypertension: Secondary | ICD-10-CM | POA: Diagnosis not present

## 2019-09-29 DIAGNOSIS — M25561 Pain in right knee: Secondary | ICD-10-CM | POA: Diagnosis not present

## 2019-10-01 DIAGNOSIS — I509 Heart failure, unspecified: Secondary | ICD-10-CM | POA: Diagnosis not present

## 2019-10-01 DIAGNOSIS — J449 Chronic obstructive pulmonary disease, unspecified: Secondary | ICD-10-CM | POA: Diagnosis not present

## 2019-10-15 DIAGNOSIS — M545 Low back pain: Secondary | ICD-10-CM | POA: Diagnosis not present

## 2019-10-15 DIAGNOSIS — M5441 Lumbago with sciatica, right side: Secondary | ICD-10-CM | POA: Diagnosis not present

## 2019-10-15 DIAGNOSIS — M5442 Lumbago with sciatica, left side: Secondary | ICD-10-CM | POA: Diagnosis not present

## 2019-10-15 DIAGNOSIS — L299 Pruritus, unspecified: Secondary | ICD-10-CM | POA: Diagnosis not present

## 2019-10-26 ENCOUNTER — Telehealth: Payer: Self-pay | Admitting: Cardiology

## 2019-10-26 NOTE — Telephone Encounter (Signed)
Pt asking if eliquis samples are available   Please call [phone]   Thanks renee

## 2019-11-16 DIAGNOSIS — E785 Hyperlipidemia, unspecified: Secondary | ICD-10-CM | POA: Diagnosis not present

## 2019-11-16 DIAGNOSIS — J9611 Chronic respiratory failure with hypoxia: Secondary | ICD-10-CM | POA: Diagnosis not present

## 2019-11-16 DIAGNOSIS — Z1159 Encounter for screening for other viral diseases: Secondary | ICD-10-CM | POA: Diagnosis not present

## 2019-11-16 DIAGNOSIS — I10 Essential (primary) hypertension: Secondary | ICD-10-CM | POA: Diagnosis not present

## 2019-11-16 DIAGNOSIS — E119 Type 2 diabetes mellitus without complications: Secondary | ICD-10-CM | POA: Diagnosis not present

## 2019-11-23 DIAGNOSIS — Z9981 Dependence on supplemental oxygen: Secondary | ICD-10-CM | POA: Diagnosis not present

## 2019-11-23 DIAGNOSIS — Z79899 Other long term (current) drug therapy: Secondary | ICD-10-CM | POA: Diagnosis not present

## 2019-11-23 DIAGNOSIS — J9611 Chronic respiratory failure with hypoxia: Secondary | ICD-10-CM | POA: Diagnosis not present

## 2019-12-08 DIAGNOSIS — E119 Type 2 diabetes mellitus without complications: Secondary | ICD-10-CM | POA: Diagnosis not present

## 2019-12-08 DIAGNOSIS — E785 Hyperlipidemia, unspecified: Secondary | ICD-10-CM | POA: Diagnosis not present

## 2019-12-08 DIAGNOSIS — Z79891 Long term (current) use of opiate analgesic: Secondary | ICD-10-CM | POA: Diagnosis not present

## 2019-12-08 DIAGNOSIS — I10 Essential (primary) hypertension: Secondary | ICD-10-CM | POA: Diagnosis not present

## 2019-12-16 DIAGNOSIS — E785 Hyperlipidemia, unspecified: Secondary | ICD-10-CM | POA: Diagnosis not present

## 2019-12-16 DIAGNOSIS — Z7984 Long term (current) use of oral hypoglycemic drugs: Secondary | ICD-10-CM | POA: Diagnosis not present

## 2019-12-16 DIAGNOSIS — I10 Essential (primary) hypertension: Secondary | ICD-10-CM | POA: Diagnosis not present

## 2019-12-16 DIAGNOSIS — I482 Chronic atrial fibrillation, unspecified: Secondary | ICD-10-CM | POA: Diagnosis not present

## 2019-12-16 DIAGNOSIS — E119 Type 2 diabetes mellitus without complications: Secondary | ICD-10-CM | POA: Diagnosis not present

## 2020-01-12 DIAGNOSIS — Z79891 Long term (current) use of opiate analgesic: Secondary | ICD-10-CM | POA: Diagnosis not present

## 2020-01-12 DIAGNOSIS — M5441 Lumbago with sciatica, right side: Secondary | ICD-10-CM | POA: Diagnosis not present

## 2020-01-12 DIAGNOSIS — M5442 Lumbago with sciatica, left side: Secondary | ICD-10-CM | POA: Diagnosis not present

## 2020-01-29 ENCOUNTER — Other Ambulatory Visit: Payer: Self-pay | Admitting: Cardiology

## 2020-02-09 DIAGNOSIS — M5441 Lumbago with sciatica, right side: Secondary | ICD-10-CM | POA: Diagnosis not present

## 2020-02-09 DIAGNOSIS — R0981 Nasal congestion: Secondary | ICD-10-CM | POA: Diagnosis not present

## 2020-02-09 DIAGNOSIS — M545 Low back pain, unspecified: Secondary | ICD-10-CM | POA: Diagnosis not present

## 2020-02-09 DIAGNOSIS — M5442 Lumbago with sciatica, left side: Secondary | ICD-10-CM | POA: Diagnosis not present

## 2020-02-09 DIAGNOSIS — R051 Acute cough: Secondary | ICD-10-CM | POA: Diagnosis not present

## 2020-03-01 DIAGNOSIS — Z79891 Long term (current) use of opiate analgesic: Secondary | ICD-10-CM | POA: Diagnosis not present

## 2020-03-15 DIAGNOSIS — I10 Essential (primary) hypertension: Secondary | ICD-10-CM | POA: Diagnosis not present

## 2020-03-15 DIAGNOSIS — G894 Chronic pain syndrome: Secondary | ICD-10-CM | POA: Diagnosis not present

## 2020-03-15 DIAGNOSIS — E785 Hyperlipidemia, unspecified: Secondary | ICD-10-CM | POA: Diagnosis not present

## 2020-03-15 DIAGNOSIS — J9611 Chronic respiratory failure with hypoxia: Secondary | ICD-10-CM | POA: Diagnosis not present

## 2020-03-15 DIAGNOSIS — E119 Type 2 diabetes mellitus without complications: Secondary | ICD-10-CM | POA: Diagnosis not present

## 2020-03-29 DIAGNOSIS — Z79891 Long term (current) use of opiate analgesic: Secondary | ICD-10-CM | POA: Diagnosis not present

## 2020-04-26 ENCOUNTER — Other Ambulatory Visit: Payer: Self-pay | Admitting: Cardiology

## 2020-05-05 ENCOUNTER — Encounter: Payer: Self-pay | Admitting: *Deleted

## 2020-05-05 ENCOUNTER — Ambulatory Visit: Payer: Medicare Other | Admitting: Cardiology

## 2020-05-05 ENCOUNTER — Encounter: Payer: Self-pay | Admitting: Cardiology

## 2020-05-05 VITALS — BP 116/80 | HR 76 | Ht 64.0 in | Wt 372.2 lb

## 2020-05-05 DIAGNOSIS — I4891 Unspecified atrial fibrillation: Secondary | ICD-10-CM | POA: Diagnosis not present

## 2020-05-05 DIAGNOSIS — I10 Essential (primary) hypertension: Secondary | ICD-10-CM | POA: Diagnosis not present

## 2020-05-05 DIAGNOSIS — R600 Localized edema: Secondary | ICD-10-CM

## 2020-05-05 NOTE — Patient Instructions (Addendum)

## 2020-05-05 NOTE — Progress Notes (Signed)
Clinical Summary Jasmin Martin is a 65 y.o.female seen today for follow up of the following medical problem.s   1. Afib  - no recent palpitations.  - compliant with meds. No bleeding on eliquis.   - no recent palpitations - no bleeding on eliquis - compliant with meds  2. HTN - she is compliant with meds  3. Nocturnal hypoxia - uses home O2 at night, reports negative sleep study for OSA   4. LE edema - takes torsemide prn at home - swelling is controlled  SH: son passed away 2017-02-21    Past Medical History:  Diagnosis Date  . Arthritis   . Atrial fibrillation ([facility])    a. on Eliquis  . Bilateral knee pain   . CHF (congestive heart failure) ([facility])   . Depression   . DJD (degenerative joint disease)   . HTN (hypertension)   . Insomnia   . Obesity, morbid ([facility])   . Persistent atrial fibrillation ([facility])   . Renal insufficiency      No Known Allergies   Current Outpatient Medications  Medication Sig Dispense Refill  . apixaban (ELIQUIS) 5 MG TABS tablet Take 5 mg by mouth 2 (two) times daily.    Marland Kitchen diltiazem (CARDIZEM CD) 300 MG 24 hr capsule TAKE ONE CAPSULE BY MOUTH ONCE DAILY. 90 capsule 3  . lisinopril (ZESTRIL) 20 MG tablet TAKE ONE TABLET BY MOUTH ONCE DAILY. 90 tablet 3  . metoprolol (TOPROL-XL) 200 MG 24 hr tablet TAKE (1) TABLET BY MOUTH ONCE DAILY. 90 tablet 3  . oxyCODONE-acetaminophen (PERCOCET) 10-325 MG per tablet Take 1 tablet by mouth every 6 (six) hours.    . OXYGEN Inhale into the lungs. Wears at night only.    . pantoprazole (PROTONIX) 40 MG tablet TAKE 1 TABLET BY MOUTH 30 MINUTES BEFORE BREAKFAST. 90 tablet 1  . potassium gluconate 595 (99 K) MG TABS tablet Take 595 mg by mouth.    . pravastatin (PRAVACHOL) 40 MG tablet Take 40 mg by mouth daily.    Marland Kitchen torsemide (DEMADEX) 100 MG tablet TAKE 1/2 TABLET BY MOUTH ONCE DAILY. 90 tablet 3   No current facility-administered medications for this visit.     Past Surgical History:   Procedure Laterality Date  . ABDOMINAL HYSTERECTOMY    . bilateral knee surgery    . CESAREAN SECTION    . CHOLECYSTECTOMY  2001  . COLONOSCOPY  2007   Dr. Jena Gauss: friable anal canal, few scattered left-sided diverticula, benign colonic biopsies   . right ankle surgery    . TONSILLECTOMY    . VESICOVAGINAL FISTULA CLOSURE W/ TAH       No Known Allergies    Family History  Problem Relation Age of Onset  . Cancer Father        Lung   . CVA Son   . Colon cancer Neg Hx      Social History Jasmin Martin reports that she has never smoked. She has never used smokeless tobacco. Jasmin Martin reports no history of alcohol use.   Review of Systems CONSTITUTIONAL: No weight loss, fever, chills, weakness or fatigue.  HEENT: Eyes: No visual loss, blurred vision, double vision or yellow sclerae.No hearing loss, sneezing, congestion, runny nose or sore throat.  SKIN: No rash or itching.  CARDIOVASCULAR: per hpi RESPIRATORY: No shortness of breath, cough or sputum.  GASTROINTESTINAL: No anorexia, nausea, vomiting or diarrhea. No abdominal pain or blood.  GENITOURINARY: No burning on urination, no polyuria  NEUROLOGICAL: No headache, dizziness, syncope, paralysis, ataxia, numbness or tingling in the extremities. No change in bowel or bladder control.  MUSCULOSKELETAL: No muscle, back pain, joint pain or stiffness.  LYMPHATICS: No enlarged nodes. No history of splenectomy.  PSYCHIATRIC: No history of depression or anxiety.  ENDOCRINOLOGIC: No reports of sweating, cold or heat intolerance. No polyuria or polydipsia.  Marland Kitchen   Physical Examination Today's Vitals   05/05/20 1543  BP: 116/80  Pulse: 76  SpO2: 96%  Weight: (!) 372 lb 3.2 oz (168.8 kg)  Height: 5\' 4"  (1.626 m)   Body mass index is 63.89 kg/m.  Gen: resting comfortably, no acute distress HEENT: no scleral icterus, pupils equal round and reactive, no palptable cervical adenopathy,  CV: irreg, 2/6 sysotlic murmur apex, no  jvd Resp: Clear to auscultation bilaterally GI: abdomen is soft, non-tender, non-distended, normal bowel sounds, no hepatosplenomegaly MSK: extremities are warm, no edema.  Skin: warm, no rash Neuro:  no focal deficits Psych: appropriate affect   Diagnostic Studies 07/14/11 Echo: LVEF 50-55%, mild LVH, mod LAE  06/2014 MPI IMPRESSION: 1. No reversible ischemia or infarction. Hypertensive response to stress.  2. Normal left ventricular wall motion.  3. Left ventricular ejection fraction 30%. However, this is likely due to gating abnormalities as echocardiogram on 06/17/14 reported normal LV systolic function, LVEF 55-60%. Function appears grossly normal on nuclear images.  4. Low-risk stress test findings*.  06/2014 echo Study Conclusions  - Left ventricle: Systolic function was normal. The estimated ejection fraction was in the range of 55% to 60%. - Aortic valve: Mildly calcified annulus. Trileaflet; mildly thickened leaflets. Valve area (VTI): 1.52 cm^2. - Mitral valve: Mildly calcified annulus. - Left atrium: The atrium was severely dilated. - Right ventricle: The cavity size was mildly dilated. - Right atrium: The atrium was severely dilated. - Atrial septum: There was a patent foramen ovale. - Technically adequate study.   Assessment and Plan  1. Afib -no recent symptoms - EKG shows rate controlled afib - continue current meds including anticoag  2. HTN -at goal,continue current meds   3. LE edema - controlled, continue prn lasix  F/u 6 months    07/2014, M.D.

## 2020-06-30 ENCOUNTER — Other Ambulatory Visit: Payer: Self-pay | Admitting: Cardiology

## 2020-08-07 ENCOUNTER — Emergency Department (HOSPITAL_COMMUNITY): Payer: Medicare Other

## 2020-08-07 ENCOUNTER — Emergency Department (HOSPITAL_COMMUNITY)
Admission: EM | Admit: 2020-08-07 | Discharge: 2020-08-08 | Disposition: A | Discharge status: Home / Self Care | Payer: Medicare Other | Attending: Emergency Medicine | Admitting: Emergency Medicine

## 2020-08-07 ENCOUNTER — Other Ambulatory Visit: Payer: Self-pay

## 2020-08-07 DIAGNOSIS — J209 Acute bronchitis, unspecified: Secondary | ICD-10-CM

## 2020-08-07 DIAGNOSIS — R0981 Nasal congestion: Secondary | ICD-10-CM | POA: Insufficient documentation

## 2020-08-07 DIAGNOSIS — M7989 Other specified soft tissue disorders: Secondary | ICD-10-CM | POA: Insufficient documentation

## 2020-08-07 DIAGNOSIS — Z7901 Long term (current) use of anticoagulants: Secondary | ICD-10-CM | POA: Insufficient documentation

## 2020-08-07 DIAGNOSIS — R059 Cough, unspecified: Secondary | ICD-10-CM | POA: Diagnosis not present

## 2020-08-07 DIAGNOSIS — J029 Acute pharyngitis, unspecified: Secondary | ICD-10-CM | POA: Diagnosis not present

## 2020-08-07 DIAGNOSIS — R0602 Shortness of breath: Secondary | ICD-10-CM | POA: Insufficient documentation

## 2020-08-07 DIAGNOSIS — R5383 Other fatigue: Secondary | ICD-10-CM | POA: Diagnosis not present

## 2020-08-07 DIAGNOSIS — I110 Hypertensive heart disease with heart failure: Secondary | ICD-10-CM | POA: Insufficient documentation

## 2020-08-07 DIAGNOSIS — Z20822 Contact with and (suspected) exposure to covid-19: Secondary | ICD-10-CM | POA: Diagnosis not present

## 2020-08-07 DIAGNOSIS — J441 Chronic obstructive pulmonary disease with (acute) exacerbation: Secondary | ICD-10-CM

## 2020-08-07 DIAGNOSIS — I5032 Chronic diastolic (congestive) heart failure: Secondary | ICD-10-CM | POA: Diagnosis not present

## 2020-08-07 DIAGNOSIS — Z79899 Other long term (current) drug therapy: Secondary | ICD-10-CM | POA: Diagnosis not present

## 2020-08-07 DIAGNOSIS — I4891 Unspecified atrial fibrillation: Secondary | ICD-10-CM | POA: Insufficient documentation

## 2020-08-07 DIAGNOSIS — R062 Wheezing: Secondary | ICD-10-CM | POA: Diagnosis not present

## 2020-08-07 LAB — CBC WITH DIFFERENTIAL/PLATELET
Abs Immature Granulocytes: 0.06 10*3/uL (ref 0.00–0.07)
Basophils Absolute: 0.1 10*3/uL (ref 0.0–0.1)
Basophils Relative: 1 %
Eosinophils Absolute: 0.3 10*3/uL (ref 0.0–0.5)
Eosinophils Relative: 2 %
HCT: 42 % (ref 36.0–46.0)
Hemoglobin: 13.4 g/dL (ref 12.0–15.0)
Immature Granulocytes: 1 %
Lymphocytes Relative: 22 %
Lymphs Abs: 2.2 10*3/uL (ref 0.7–4.0)
MCH: 33.6 pg (ref 26.0–34.0)
MCHC: 31.9 g/dL (ref 30.0–36.0)
MCV: 105.3 fL — ABNORMAL HIGH (ref 80.0–100.0)
Monocytes Absolute: 1.3 10*3/uL — ABNORMAL HIGH (ref 0.1–1.0)
Monocytes Relative: 13 %
Neutro Abs: 6.3 10*3/uL (ref 1.7–7.7)
Neutrophils Relative %: 61 %
Platelets: 267 10*3/uL (ref 150–400)
RBC: 3.99 MIL/uL (ref 3.87–5.11)
RDW: 12.6 % (ref 11.5–15.5)
WBC: 10.2 10*3/uL (ref 4.0–10.5)
nRBC: 0 % (ref 0.0–0.2)

## 2020-08-07 LAB — COMPREHENSIVE METABOLIC PANEL
ALT: 38 U/L (ref 0–44)
AST: 34 U/L (ref 15–41)
Albumin: 3.5 g/dL (ref 3.5–5.0)
Alkaline Phosphatase: 124 U/L (ref 38–126)
Anion gap: 10 (ref 5–15)
BUN: 10 mg/dL (ref 8–23)
CO2: 32 mmol/L (ref 22–32)
Calcium: 8.2 mg/dL — ABNORMAL LOW (ref 8.9–10.3)
Chloride: 98 mmol/L (ref 98–111)
Creatinine, Ser: 0.72 mg/dL (ref 0.44–1.00)
GFR, Estimated: >60 mL/min (ref >60)
Glucose, Bld: 132 mg/dL — ABNORMAL HIGH (ref 70–99)
Potassium: 3.3 mmol/L — ABNORMAL LOW (ref 3.5–5.1)
Sodium: 140 mmol/L (ref 135–145)
Total Bilirubin: 0.9 mg/dL (ref 0.3–1.2)
Total Protein: 7.3 g/dL (ref 6.5–8.1)

## 2020-08-07 LAB — RESP PANEL BY RT-PCR (FLU A&B, COVID) ARPGX2
Influenza A by PCR: NEGATIVE
Influenza B by PCR: NEGATIVE
SARS Coronavirus 2 by RT PCR: NEGATIVE

## 2020-08-07 LAB — BRAIN NATRIURETIC PEPTIDE: B Natriuretic Peptide: 138 pg/mL — ABNORMAL HIGH (ref 0.0–100.0)

## 2020-08-07 MED ORDER — SODIUM CHLORIDE 0.9 % IV BOLUS
500.0000 mL | Freq: Once | INTRAVENOUS | Status: AC
  Administered 2020-08-07: 500 mL via INTRAVENOUS

## 2020-08-07 MED ORDER — AEROCHAMBER PLUS FLO-VU MEDIUM MISC
1.0000 | Freq: Once | Status: AC
  Administered 2020-08-07: 1
  Filled 2020-08-07: qty 1

## 2020-08-07 MED ORDER — ALBUTEROL SULFATE HFA 108 (90 BASE) MCG/ACT IN AERS
4.0000 | INHALATION_SPRAY | Freq: Once | RESPIRATORY_TRACT | Status: AC
  Administered 2020-08-07: 4 via RESPIRATORY_TRACT
  Filled 2020-08-07: qty 6.7

## 2020-08-07 NOTE — ED Provider Notes (Signed)
Patient is a 65 year old female, she is morbidly obese, she has a history of persistent atrial fibrillation on Eliquis, congestive heart failure, she is morbidly obese.  On exam the patient has frequent coughing, expiratory wheezing but able to speak in full sentences, she has a respiratory rate of 18 and a pulse of 70 she is afebrile and has oxygen of 93 to 98% depending on her respiratory effort.  She does have some edema of the legs.  Labs and x-ray pending, at change of shift care will be signed out to oncoming physician Dr. Judd Lien.  Patient will be given some medications including doxycycline, albuterol, prednisone.  Disposition to be determined by oncoming physician  Medical screening examination/treatment/procedure(s) were conducted as a shared visit with non-physician practitioner(s) and myself.  I personally evaluated the patient during the encounter.  Clinical Impression:   Final diagnoses:  None         Eber Hong, MD 08/09/20 [id number]

## 2020-08-07 NOTE — ED Provider Notes (Signed)
[facility] EMERGENCY DEPARTMENT Provider Note   CSN: [id number] Arrival date & time: 08/07/20  2157     History Chief Complaint  Patient presents with  . Shortness of Breath    Jasmin Martin is a 65 y.o. female with a history of persistent atrial fibrillation (on Eliquis), CHF (EF 55 to 60% echo 06/2014), obesity, hypertension.  Patient presents with complaint of shortness of breath and cough.  Patient reports that her symptoms began Tuesday evening and have been getting progressively worse since then.  Patient reports that cough is producing sputum occasionally.  When she does produce sputum describes it as a dark brown color.  Patient reports her shortness of breath is worse with any exertion.  Patient is on 2 L of oxygen via nasal cannula at all times.  Patient will also endorses sore throat, runny nose, fatigue, myalgias.  Patient endorses that she has been having drooling and trouble swallowing.    Patient denies any high potato voice, fevers, chills, loss of smell or taste, abdominal pain, nausea, vomiting, chest pain, palpitations, lightheadedness, dizziness, syncopal episodes.  Patient reports she has had leg swelling over the last few days.  Over this time she has not been taking her Lasix medication as prescribed.  Patient denies any noticeable weight gain.  Patient denies any known sick contacts.  Patient endorses being vaccinated for flu as well as vaccinated for COVID-19 x 2 as well as booster.  HPI     Past Medical History:  Diagnosis Date  . Arthritis   . Atrial fibrillation (HCC)    a. on Eliquis  . Bilateral knee pain   . CHF (congestive heart failure) (HCC)   . Depression   . DJD (degenerative joint disease)   . HTN (hypertension)   . Insomnia   . Obesity, morbid (HCC)   . Persistent atrial fibrillation (HCC)   . Renal insufficiency     Patient Active Problem List   Diagnosis Date Noted  . Elevated LFTs 07/22/2015  . Chest pain 06/16/2014  . Hypotension  06/16/2014  . Acute kidney injury (HCC) 06/16/2014  . Encounter for therapeutic drug monitoring 04/27/2013  . OA (osteoarthritis) of knee 05/22/2012  . Neuropathy 05/22/2012  . Diastolic CHF, chronic (HCC) 07/31/2011  . Weakness of both legs 05/22/2011  . Long term current use of anticoagulant 06/30/2010  . TENOSYNOVITIS OF FOOT AND ANKLE 03/01/2010  . STRESS FRACTURE, FOOT 03/01/2010  . MUSCLE CRAMPS 10/18/2009  . Morbid obesity (HCC) 08/12/2009  . Anxiety state 07/18/2009  . DYSPNEA 07/18/2009  . PALPITATIONS 06/15/2009  . Hypertension 06/02/2009  . HYPOKALEMIA 05/31/2009  . FIBRILLATION, ATRIAL 05/31/2009  . PNEUMONIA 05/31/2009  . INFLUENZA 05/31/2009    Past Surgical History:  Procedure Laterality Date  . ABDOMINAL HYSTERECTOMY    . bilateral knee surgery    . CESAREAN SECTION    . CHOLECYSTECTOMY  2001  . COLONOSCOPY  2007   Dr. Jena Gaussourk: friable anal canal, few scattered left-sided diverticula, benign colonic biopsies   . right ankle surgery    . TONSILLECTOMY    . VESICOVAGINAL FISTULA CLOSURE W/ TAH       OB History   No obstetric history on file.     Family History  Problem Relation Age of Onset  . Cancer Father        Lung   . CVA Son   . Colon cancer Neg Hx     Social History   Tobacco Use  . Smoking status:  Never Smoker  . Smokeless tobacco: Never Used  . Tobacco comment: Never smoked  Vaping Use  . Vaping Use: Never used  Substance Use Topics  . Alcohol use: No    Alcohol/week: 0.0 standard drinks  . Drug use: No    Home Medications Prior to Admission medications   Medication Sig Start Date End Date Taking? Authorizing Provider  apixaban (ELIQUIS) 5 MG TABS tablet Take 5 mg by mouth 2 (two) times daily.    [provider]  diltiazem (CARDIZEM CD) 300 MG 24 hr capsule TAKE ONE CAPSULE BY MOUTH ONCE DAILY. 01/29/20   Antoine Poche, MD  lisinopril (ZESTRIL) 20 MG tablet TAKE ONE TABLET BY MOUTH ONCE DAILY. 06/30/20   Antoine Poche, MD  metoprolol (TOPROL-XL) 200 MG 24 hr tablet TAKE (1) TABLET BY MOUTH ONCE DAILY. 08/28/19   Antoine Poche, MD  oxyCODONE-acetaminophen (PERCOCET) 10-325 MG per tablet Take 1 tablet by mouth every 6 (six) hours.    [provider]  OXYGEN Inhale into the lungs. Wears at night only.    [provider]  pantoprazole (PROTONIX) 40 MG tablet TAKE 1 TABLET BY MOUTH 30 MINUTES BEFORE BREAKFAST. 11/27/18   Anice Paganini, NP  potassium gluconate 595 (99 K) MG TABS tablet Take 595 mg by mouth.    [provider]  pravastatin (PRAVACHOL) 40 MG tablet Take 40 mg by mouth daily.    [provider]  torsemide (DEMADEX) 100 MG tablet TAKE 1/2 TABLET BY MOUTH ONCE DAILY. 04/26/20   Antoine Poche, MD    Allergies    Patient has no known allergies.  Review of Systems   Review of Systems  Constitutional: Positive for fatigue. Negative for chills and fever.  HENT: Positive for drooling, rhinorrhea, sore throat and trouble swallowing. Negative for congestion and voice change.   Eyes: Positive for discharge (clear) and itching.  Respiratory: Positive for cough and shortness of breath.   Cardiovascular: Positive for leg swelling. Negative for chest pain and palpitations.  Gastrointestinal: Negative for abdominal pain, diarrhea, nausea and vomiting.  Genitourinary: Negative for difficulty urinating, dysuria and hematuria.  Musculoskeletal: Positive for myalgias. Negative for back pain and neck pain.  Skin: Negative for color change and rash.  Neurological: Negative for dizziness, syncope, light-headedness and headaches.  Psychiatric/Behavioral: Negative for confusion.    Physical Exam Updated Vital Signs Ht 5\' 4"  (1.626 m)   Wt (!) 169 kg   BMI 63.95 kg/m   Physical Exam Vitals and nursing note reviewed.  Constitutional:      General: She is not in acute distress.    Appearance: She is morbidly obese. She is ill-appearing. She is not  toxic-appearing or diaphoretic.     Interventions: Nasal cannula in place.     Comments: On 2 LPM via nasal cannula  HENT:     Head: Normocephalic.     Mouth/Throat:     Lips: Pink.     Mouth: Mucous membranes are moist.     Tongue: No lesions. Tongue does not deviate from midline.     Palate: No mass and lesions.     Pharynx: Oropharynx is clear. Uvula midline. Posterior oropharyngeal erythema present. No pharyngeal swelling, oropharyngeal exudate or uvula swelling.     Tonsils: 0 on the right. 0 on the left.     Comments: Patient able to handle oral secretions without difficulty Eyes:     General: No scleral icterus.  Right eye: No discharge.        Left eye: No discharge.  Cardiovascular:     Rate and Rhythm: Normal rate.     Heart sounds: Normal heart sounds.  Pulmonary:     Effort: Pulmonary effort is normal. Tachypnea and prolonged expiration present. No respiratory distress.     Breath sounds: Decreased air movement present. No stridor. Examination of the right-upper field reveals wheezing. Examination of the left-upper field reveals wheezing. Examination of the right-middle field reveals wheezing. Examination of the left-middle field reveals wheezing. Examination of the right-lower field reveals wheezing. Examination of the left-lower field reveals wheezing. Wheezing present.     Comments: Patient speaks in full complete sentences without difficulty   Abdominal:     Palpations: Abdomen is soft.     Tenderness: There is no abdominal tenderness.  Musculoskeletal:     Cervical back: Full passive range of motion without pain, normal range of motion and neck supple. No edema, erythema, signs of trauma, rigidity, torticollis or crepitus. No pain with movement, spinous process tenderness or muscular tenderness. Normal range of motion.     Right lower leg: Tenderness present. 1+ Edema present.     Left lower leg: Tenderness present. 1+ Edema present.  Lymphadenopathy:      Cervical: No cervical adenopathy.  Skin:    General: Skin is warm and dry.     Coloration: Skin is not cyanotic or pale.  Neurological:     General: No focal deficit present.     Mental Status: She is alert.  Psychiatric:        Behavior: Behavior is cooperative.     ED Results / Procedures / Treatments   Labs (all labs ordered are listed, but only abnormal results are displayed) Labs Reviewed  RESP PANEL BY RT-PCR (FLU A&B, COVID) [id number]  COMPREHENSIVE METABOLIC PANEL  CBC WITH DIFFERENTIAL/PLATELET  BRAIN NATRIURETIC PEPTIDE    EKG EKG Interpretation  Date/Time:  Sunday Aug 07 2020 22:03:47 EDT Ventricular Rate:  68 PR Interval:    QRS Duration: 106 QT Interval:  426 QTC Calculation: 454 R Axis:   142 Text Interpretation: Atrial fibrillation Left posterior fascicular block Low voltage, precordial leads Abnormal R-wave progression, late transition Nonspecific T abnormalities, lateral leads since last tracing no significant change Confirmed by Eber Hong ([id number]) on 08/07/2020 10:53:02 PM   Radiology No results found.  Procedures Procedures   Medications Ordered in ED Medications  sodium chloride 0.9 % bolus 500 mL (has no administration in time range)  albuterol (VENTOLIN HFA) 108 (90 Base) MCG/ACT inhaler 4 puff (4 puffs Inhalation Given 08/07/20 2238)  AeroChamber Plus Flo-Vu Medium MISC 1 each (1 each Other Given 08/07/20 2238)    ED Course  I have reviewed the triage vital signs and the nursing notes.  Pertinent labs & imaging results that were available during my care of the patient were reviewed by me and considered in my medical decision making (see chart for details).    MDM Rules/Calculators/A&P                          Alert 65 year old female no acute stress, denies nontoxic-appearing, patient appears ill.  Patient is on 2 L of oxygen via nasal cannula at baseline.  Patient presents with chief complaint of shortness of breath, cough, sore throat,  fatigue, runny nose, myalgias.  Started on Tuesday and has previously worsened since then.  Denies any known sick contacts.  Fully vaccinated for COVID-19 and influenza.  Physical exam patient has expiratory wheezing in all lung fields, prolonged expiration and decreased air movement.  Patient is able to speak in full complete sentences without difficulty.  No respiratory distress.  Oxygen saturation 92 to 98% on her 2 L of oxygen via nasal cannula.  Patient has erythema to oropharynx without swelling or exudate.  Patient has full passive range of motion of neck without pain.  Patient able to handle oral secretions without difficulty.  Patient noted to have minimal edema to bilateral lower extremities.  Due to wheezing patient was given albuterol inhaler.  Due to patient's history of CHF will obtain BNP to evaluate for possible fluid overload status. Will obtain CBC, CMP, respiratory panel, chest x-ray and chest x-ray.  Patient's lab work is unremarkable and she remains hemodynamically stable would recommend a course of doxycycline as well as albuterol inhaler.  Would have patient follow-up with primary care provider.  Patient was discussed with and evaluated by Dr. Hyacinth Meeker.  Patient care transferred to Dr.Delo at the end of my shift. Patient presentation, ED course, and plan of care discussed with review of all pertinent labs and imaging. Please see his/her note for further details regarding further ED course and disposition.    Final Clinical Impression(s) / ED Diagnoses Final diagnoses:  None    Rx / DC Orders ED Discharge Orders    None       Berneice Heinrich 08/07/20 2312    Geoffery Lyons, MD 08/08/20 [phone]

## 2020-08-07 NOTE — ED Triage Notes (Signed)
rcems from home with cc of sob  Recently went to pcp office but lately has been coughing more. States it has been moving to her chest/ hx chf, a fib, pneumonia in the past. Came in so she wouldn't get pneumonia. 2l chronic at home. Alert and oriented x4

## 2020-08-08 MED ORDER — DOXYCYCLINE HYCLATE 100 MG PO TABS
100.0000 mg | ORAL_TABLET | Freq: Once | ORAL | Status: AC
  Administered 2020-08-08: 100 mg via ORAL
  Filled 2020-08-08: qty 1

## 2020-08-08 MED ORDER — PREDNISONE 10 MG PO TABS: 20.0000 mg | ORAL_TABLET | Freq: Two times a day (BID) | ORAL | 0 refills | Status: DC

## 2020-08-08 MED ORDER — DOXYCYCLINE HYCLATE 100 MG PO CAPS: 100.0000 mg | ORAL_CAPSULE | Freq: Two times a day (BID) | ORAL | 0 refills | Status: DC

## 2020-08-08 NOTE — Discharge Instructions (Addendum)
Begin taking doxycycline and prednisone as prescribed.  Continue use of your albuterol, 2 puffs every 4 hours as needed.  Return to the emergency department if you develop chest pain, worsening breathing, or other new and concerning symptoms.

## 2020-08-08 NOTE — ED Notes (Signed)
Pt rec'd d/c papers. Wheeled out to lobby to wait on ride

## 2020-09-14 ENCOUNTER — Ambulatory Visit
Admission: EM | Admit: 2020-09-14 | Discharge: 2020-09-14 | Disposition: A | Discharge status: Home / Self Care | Payer: Medicare Other | Attending: Family Medicine | Admitting: Family Medicine

## 2020-09-14 ENCOUNTER — Ambulatory Visit (INDEPENDENT_AMBULATORY_CARE_PROVIDER_SITE_OTHER): Payer: Medicare Other

## 2020-09-14 ENCOUNTER — Other Ambulatory Visit: Payer: Self-pay

## 2020-09-14 DIAGNOSIS — R062 Wheezing: Secondary | ICD-10-CM

## 2020-09-14 DIAGNOSIS — R0602 Shortness of breath: Secondary | ICD-10-CM | POA: Diagnosis not present

## 2020-09-14 DIAGNOSIS — R059 Cough, unspecified: Secondary | ICD-10-CM | POA: Diagnosis not present

## 2020-09-14 DIAGNOSIS — I10 Essential (primary) hypertension: Secondary | ICD-10-CM

## 2020-09-14 MED ORDER — PREDNISONE 20 MG PO TABS: 40.0000 mg | ORAL_TABLET | Freq: Every day | ORAL | 0 refills | Status: DC

## 2020-09-14 MED ORDER — METHYLPREDNISOLONE SODIUM SUCC 125 MG IJ SOLR
125.0000 mg | Freq: Once | INTRAMUSCULAR | Status: AC
  Administered 2020-09-14: 125 mg via INTRAMUSCULAR

## 2020-09-14 MED ORDER — ALBUTEROL SULFATE HFA 108 (90 BASE) MCG/ACT IN AERS
2.0000 | INHALATION_SPRAY | Freq: Once | RESPIRATORY_TRACT | Status: AC
  Administered 2020-09-14: 2 via RESPIRATORY_TRACT

## 2020-09-14 NOTE — ED Triage Notes (Signed)
Pt presents with cough with wheezing and sob  for past couple of days

## 2020-09-14 NOTE — Discharge Instructions (Addendum)
Your blood pressure was noted to be elevated during your visit today. If you are currently taking medication for high blood pressure, please ensure you are taking this as directed. If you do not have a history of high blood pressure and your blood pressure remains persistently elevated, you may need to begin taking a medication at some point. You may return here within the next few days to recheck if unable to see your primary care provider or if you do not have a one.  BP (!) 158/105   Pulse 78   Temp 97.9 F (36.6 C)   Resp (!) 26   SpO2 94%   BP Readings from Last 3 Encounters:  09/14/20 (!) 158/105  08/08/20 (!) 116/59  05/05/20 116/80

## 2020-09-17 NOTE — ED Provider Notes (Addendum)
[facility] CARE CENTER   [id number] 09/14/20 Arrival Time: 1002  ASSESSMENT & PLAN:  1. Cough   2. Wheezing   3. SOB (shortness of breath)   4. Elevated blood pressure reading in office with diagnosis of hypertension    Reports significant improvement with alb use here. Discussed typical duration of viral illnesses. COVID-19 testing sent. OTC symptom care as needed.  I have personally viewed the imaging studies ordered this visit. No acute pulmonary disease appreciated.  Begin: Meds ordered this encounter  Medications   albuterol (VENTOLIN HFA) 108 (90 Base) MCG/ACT inhaler 2 puff   methylPREDNISolone sodium succinate (SOLU-MEDROL) 125 mg/2 mL injection 125 mg   predniSONE (DELTASONE) 20 MG tablet    Sig: Take 2 tablets (40 mg total) by mouth daily.    Dispense:  10 tablet    Refill:  0     Discharge Instructions      Your blood pressure was noted to be elevated during your visit today. If you are currently taking medication for high blood pressure, please ensure you are taking this as directed. If you do not have a history of high blood pressure and your blood pressure remains persistently elevated, you may need to begin taking a medication at some point. You may return here within the next few days to recheck if unable to see your primary care provider or if you do not have a one.  BP (!) 158/105   Pulse 78   Temp 97.9 F (36.6 C)   Resp (!) 26   SpO2 94%   BP Readings from Last 3 Encounters:  09/14/20 (!) 158/105  08/08/20 (!) 116/59  05/05/20 116/80          Follow-up Information     [facility] EMERGENCY DEPARTMENT.   Specialty: Emergency Medicine Why: If symptoms worsen in any way. Contact information: [address] [id number] mc [place] [address] [phone]                Reviewed expectations re: course of current medical issues. Questions answered. Outlined signs and symptoms indicating need  for more acute intervention. Understanding verbalized. After Visit Summary given.   SUBJECTIVE: History from: patient. Jasmin Martin is a 65 y.o. female who reports cough, wheezing, SOB; abrupt onset 2 d ago. Denies: runny nose, fever, sore throat, and headache. Normal PO intake without n/v/d. No CP. Increased blood pressure noted today. Reports that she is treated for HTN. She reports taking medications as instructed, no chest pain on exertion, no swelling of ankles, no orthopnea or paroxysmal nocturnal dyspnea, and no palpitations.   OBJECTIVE:  Vitals:   09/14/20 1043  BP: (!) 158/105  Pulse: 78  Resp: (!) 26  Temp: 97.9 F (36.6 C)  SpO2: 94%    Recheck RR: 18 General appearance: alert; no distress Eyes: PERRLA; EOMI; conjunctiva normal HENT: [place]; AT; with nasal congestion Neck: supple  Lungs: speaks full sentences without difficulty; bilateral exp wheezing; slightly labored breathing initially, better p albuterol Extremities: no edema Skin: warm and dry Neurologic: normal gait Psychological: alert and cooperative; normal mood and affect  Imaging: DG Chest 2 View  Result Date: 09/14/2020 CLINICAL DATA:  Shortness of breath. EXAM: CHEST - 2 VIEW COMPARISON:  Aug 07, 2020. FINDINGS: Slightly improved streaky left basilar opacities. No visible pleural effusions or pneumothorax. Enlarged cardiac silhouette, similar to prior. Calcific atherosclerosis of the aorta. Multilevel degenerative change of the thoracic spine. IMPRESSION: 1. Slightly improved streaky left basilar  opacities, most likely atelectasis or scar. Infection is not excluded. 2. Similar cardiomegaly. Electronically Signed   By: Feliberto Harts MD   On: 09/14/2020 11:13    No Known Allergies  Past Medical History:  Diagnosis Date   Arthritis    Atrial fibrillation (HCC)    a. on Eliquis   Bilateral knee pain    CHF (congestive heart failure) (HCC)    Depression    DJD (degenerative joint disease)    HTN  (hypertension)    Insomnia    Obesity, morbid (HCC)    Persistent atrial fibrillation (HCC)    Renal insufficiency    Social History   Socioeconomic History   Marital status: Divorced    Spouse name: Not on file   Number of children: Not on file   Years of education: Not on file   Highest education level: Not on file  Occupational History   Not on file  Tobacco Use   Smoking status: Never   Smokeless tobacco: Never   Tobacco comments:    Never smoked  Vaping Use   Vaping Use: Never used  Substance and Sexual Activity   Alcohol use: No    Alcohol/week: 0.0 standard drinks   Drug use: No   Sexual activity: Yes    Birth control/protection: Surgical  Other Topics Concern   Not on file  Social History Narrative   Not on file   Social Determinants of Health   Financial Resource Strain: Not on file  Food Insecurity: Not on file  Transportation Needs: Not on file  Physical Activity: Not on file  Stress: Not on file  Social Connections: Not on file  Intimate Partner Violence: Not on file   Family History  Problem Relation Age of Onset   Cancer Father        Lung    CVA Son    Colon cancer Neg Hx    Past Surgical History:  Procedure Laterality Date   ABDOMINAL HYSTERECTOMY     bilateral knee surgery     CESAREAN SECTION     CHOLECYSTECTOMY  2001   COLONOSCOPY  2007   Dr. Jena Gauss: friable anal canal, few scattered left-sided diverticula, benign colonic biopsies    right ankle surgery     TONSILLECTOMY     VESICOVAGINAL FISTULA CLOSURE W/ TAH       Mardella Layman, MD 09/17/20 1027    Mardella Layman, MD 09/17/20 1027

## 2020-10-11 ENCOUNTER — Inpatient Hospital Stay (HOSPITAL_COMMUNITY)
Admission: EM | Admit: 2020-10-11 | Discharge: 2020-10-17 | DRG: 291 | Disposition: A | Discharge status: Home / Self Care | Payer: Medicare Other | Attending: Internal Medicine | Admitting: Internal Medicine

## 2020-10-11 ENCOUNTER — Encounter (HOSPITAL_COMMUNITY): Payer: Self-pay | Admitting: Emergency Medicine

## 2020-10-11 ENCOUNTER — Emergency Department (HOSPITAL_COMMUNITY): Payer: Medicare Other

## 2020-10-11 ENCOUNTER — Other Ambulatory Visit: Payer: Self-pay

## 2020-10-11 DIAGNOSIS — K219 Gastro-esophageal reflux disease without esophagitis: Secondary | ICD-10-CM | POA: Diagnosis present

## 2020-10-11 DIAGNOSIS — I4891 Unspecified atrial fibrillation: Secondary | ICD-10-CM | POA: Diagnosis not present

## 2020-10-11 DIAGNOSIS — I272 Pulmonary hypertension, unspecified: Secondary | ICD-10-CM | POA: Diagnosis present

## 2020-10-11 DIAGNOSIS — J449 Chronic obstructive pulmonary disease, unspecified: Secondary | ICD-10-CM | POA: Diagnosis present

## 2020-10-11 DIAGNOSIS — I482 Chronic atrial fibrillation, unspecified: Secondary | ICD-10-CM | POA: Diagnosis not present

## 2020-10-11 DIAGNOSIS — M1990 Unspecified osteoarthritis, unspecified site: Secondary | ICD-10-CM | POA: Diagnosis present

## 2020-10-11 DIAGNOSIS — Z90710 Acquired absence of both cervix and uterus: Secondary | ICD-10-CM | POA: Diagnosis not present

## 2020-10-11 DIAGNOSIS — E119 Type 2 diabetes mellitus without complications: Secondary | ICD-10-CM | POA: Diagnosis present

## 2020-10-11 DIAGNOSIS — E876 Hypokalemia: Secondary | ICD-10-CM | POA: Diagnosis present

## 2020-10-11 DIAGNOSIS — Z6841 Body Mass Index (BMI) 40.0 and over, adult: Secondary | ICD-10-CM

## 2020-10-11 DIAGNOSIS — Z801 Family history of malignant neoplasm of trachea, bronchus and lung: Secondary | ICD-10-CM | POA: Diagnosis not present

## 2020-10-11 DIAGNOSIS — Z20822 Contact with and (suspected) exposure to covid-19: Secondary | ICD-10-CM | POA: Diagnosis present

## 2020-10-11 DIAGNOSIS — R000 Tachycardia, unspecified: Secondary | ICD-10-CM | POA: Diagnosis present

## 2020-10-11 DIAGNOSIS — I110 Hypertensive heart disease with heart failure: Secondary | ICD-10-CM | POA: Diagnosis not present

## 2020-10-11 DIAGNOSIS — I5043 Acute on chronic combined systolic (congestive) and diastolic (congestive) heart failure: Secondary | ICD-10-CM | POA: Diagnosis present

## 2020-10-11 DIAGNOSIS — I10 Essential (primary) hypertension: Secondary | ICD-10-CM | POA: Diagnosis present

## 2020-10-11 DIAGNOSIS — J9611 Chronic respiratory failure with hypoxia: Secondary | ICD-10-CM | POA: Diagnosis present

## 2020-10-11 DIAGNOSIS — I350 Nonrheumatic aortic (valve) stenosis: Secondary | ICD-10-CM | POA: Diagnosis present

## 2020-10-11 DIAGNOSIS — Z7984 Long term (current) use of oral hypoglycemic drugs: Secondary | ICD-10-CM

## 2020-10-11 DIAGNOSIS — Z9049 Acquired absence of other specified parts of digestive tract: Secondary | ICD-10-CM | POA: Diagnosis not present

## 2020-10-11 DIAGNOSIS — R031 Nonspecific low blood-pressure reading: Secondary | ICD-10-CM | POA: Diagnosis not present

## 2020-10-11 DIAGNOSIS — I5021 Acute systolic (congestive) heart failure: Secondary | ICD-10-CM | POA: Diagnosis present

## 2020-10-11 DIAGNOSIS — Z7901 Long term (current) use of anticoagulants: Secondary | ICD-10-CM

## 2020-10-11 DIAGNOSIS — Z79899 Other long term (current) drug therapy: Secondary | ICD-10-CM | POA: Diagnosis not present

## 2020-10-11 DIAGNOSIS — Z9981 Dependence on supplemental oxygen: Secondary | ICD-10-CM | POA: Diagnosis not present

## 2020-10-11 DIAGNOSIS — I5033 Acute on chronic diastolic (congestive) heart failure: Secondary | ICD-10-CM | POA: Diagnosis not present

## 2020-10-11 DIAGNOSIS — I4821 Permanent atrial fibrillation: Secondary | ICD-10-CM | POA: Diagnosis present

## 2020-10-11 DIAGNOSIS — E785 Hyperlipidemia, unspecified: Secondary | ICD-10-CM | POA: Diagnosis not present

## 2020-10-11 DIAGNOSIS — Z823 Family history of stroke: Secondary | ICD-10-CM

## 2020-10-11 DIAGNOSIS — E782 Mixed hyperlipidemia: Secondary | ICD-10-CM | POA: Diagnosis present

## 2020-10-11 DIAGNOSIS — G4700 Insomnia, unspecified: Secondary | ICD-10-CM | POA: Diagnosis present

## 2020-10-11 DIAGNOSIS — J9610 Chronic respiratory failure, unspecified whether with hypoxia or hypercapnia: Secondary | ICD-10-CM | POA: Diagnosis not present

## 2020-10-11 DIAGNOSIS — I445 Left posterior fascicular block: Secondary | ICD-10-CM | POA: Diagnosis present

## 2020-10-11 LAB — CBC
HCT: 38.2 % (ref 36.0–46.0)
Hemoglobin: 12.3 g/dL (ref 12.0–15.0)
MCH: 33.8 pg (ref 26.0–34.0)
MCHC: 32.2 g/dL (ref 30.0–36.0)
MCV: 104.9 fL — ABNORMAL HIGH (ref 80.0–100.0)
Platelets: 250 10*3/uL (ref 150–400)
RBC: 3.64 MIL/uL — ABNORMAL LOW (ref 3.87–5.11)
RDW: 14 % (ref 11.5–15.5)
WBC: 6.5 10*3/uL (ref 4.0–10.5)
nRBC: 0 % (ref 0.0–0.2)

## 2020-10-11 LAB — BASIC METABOLIC PANEL
Anion gap: 9 (ref 5–15)
BUN: 12 mg/dL (ref 8–23)
CO2: 30 mmol/L (ref 22–32)
Calcium: 8.3 mg/dL — ABNORMAL LOW (ref 8.9–10.3)
Chloride: 101 mmol/L (ref 98–111)
Creatinine, Ser: 0.76 mg/dL (ref 0.44–1.00)
GFR, Estimated: >60 mL/min (ref >60)
Glucose, Bld: 139 mg/dL — ABNORMAL HIGH (ref 70–99)
Potassium: 4 mmol/L (ref 3.5–5.1)
Sodium: 140 mmol/L (ref 135–145)

## 2020-10-11 LAB — BRAIN NATRIURETIC PEPTIDE: B Natriuretic Peptide: 161 pg/mL — ABNORMAL HIGH (ref 0.0–100.0)

## 2020-10-11 LAB — MAGNESIUM: Magnesium: 1.5 mg/dL — ABNORMAL LOW (ref 1.7–2.4)

## 2020-10-11 LAB — TROPONIN I (HIGH SENSITIVITY): Troponin I (High Sensitivity): 11 ng/L (ref <18)

## 2020-10-11 LAB — GLUCOSE, CAPILLARY: Glucose-Capillary: 125 mg/dL — ABNORMAL HIGH (ref 70–99)

## 2020-10-11 MED ORDER — FUROSEMIDE 10 MG/ML IJ SOLN
80.0000 mg | Freq: Once | INTRAMUSCULAR | Status: AC
  Administered 2020-10-11: 80 mg via INTRAVENOUS
  Filled 2020-10-11: qty 8

## 2020-10-11 MED ORDER — FUROSEMIDE 10 MG/ML IJ SOLN
60.0000 mg | Freq: Two times a day (BID) | INTRAMUSCULAR | Status: DC
  Administered 2020-10-12 – 2020-10-13 (×3): 60 mg via INTRAVENOUS
  Filled 2020-10-11 (×3): qty 6

## 2020-10-11 MED ORDER — OXYCODONE-ACETAMINOPHEN 10-325 MG PO TABS: 1.0000 | ORAL_TABLET | Freq: Four times a day (QID) | ORAL | Status: DC | PRN

## 2020-10-11 MED ORDER — PRAVASTATIN SODIUM 40 MG PO TABS
40.0000 mg | ORAL_TABLET | Freq: Every day | ORAL | Status: DC
  Administered 2020-10-12 – 2020-10-17 (×6): 40 mg via ORAL
  Filled 2020-10-11 (×7): qty 1

## 2020-10-11 MED ORDER — MAGNESIUM SULFATE 2 GM/50ML IV SOLN
2.0000 g | Freq: Once | INTRAVENOUS | Status: AC
  Administered 2020-10-11: 2 g via INTRAVENOUS
  Filled 2020-10-11: qty 50

## 2020-10-11 MED ORDER — APIXABAN 5 MG PO TABS
5.0000 mg | ORAL_TABLET | Freq: Two times a day (BID) | ORAL | Status: DC
  Administered 2020-10-11 – 2020-10-17 (×12): 5 mg via ORAL
  Filled 2020-10-11 (×12): qty 1

## 2020-10-11 MED ORDER — ACETAMINOPHEN 325 MG PO TABS: 650.0000 mg | ORAL_TABLET | Freq: Four times a day (QID) | ORAL | Status: DC | PRN

## 2020-10-11 MED ORDER — ONDANSETRON HCL 4 MG PO TABS: 4.0000 mg | ORAL_TABLET | Freq: Four times a day (QID) | ORAL | Status: DC | PRN

## 2020-10-11 MED ORDER — POLYETHYLENE GLYCOL 3350 17 G PO PACK: 17.0000 g | PACK | Freq: Every day | ORAL | Status: DC | PRN

## 2020-10-11 MED ORDER — INSULIN ASPART 100 UNIT/ML IJ SOLN
0.0000 [IU] | Freq: Three times a day (TID) | INTRAMUSCULAR | Status: DC
  Administered 2020-10-12 (×3): 1 [IU] via SUBCUTANEOUS
  Administered 2020-10-13: 2 [IU] via SUBCUTANEOUS
  Administered 2020-10-13: 1 [IU] via SUBCUTANEOUS
  Administered 2020-10-14: 2 [IU] via SUBCUTANEOUS
  Administered 2020-10-14 – 2020-10-15 (×2): 1 [IU] via SUBCUTANEOUS
  Administered 2020-10-15: 2 [IU] via SUBCUTANEOUS
  Administered 2020-10-16 – 2020-10-17 (×3): 1 [IU] via SUBCUTANEOUS

## 2020-10-11 MED ORDER — OXYCODONE-ACETAMINOPHEN 5-325 MG PO TABS
1.0000 | ORAL_TABLET | Freq: Four times a day (QID) | ORAL | Status: DC | PRN
  Administered 2020-10-11 – 2020-10-17 (×6): 1 via ORAL
  Filled 2020-10-11 (×6): qty 1

## 2020-10-11 MED ORDER — LISINOPRIL 10 MG PO TABS
20.0000 mg | ORAL_TABLET | Freq: Every day | ORAL | Status: DC
  Administered 2020-10-12 – 2020-10-13 (×2): 20 mg via ORAL
  Filled 2020-10-11 (×3): qty 2

## 2020-10-11 MED ORDER — ACETAMINOPHEN 650 MG RE SUPP: 650.0000 mg | Freq: Four times a day (QID) | RECTAL | Status: DC | PRN

## 2020-10-11 MED ORDER — OXYCODONE HCL 5 MG PO TABS
5.0000 mg | ORAL_TABLET | Freq: Four times a day (QID) | ORAL | Status: DC | PRN
  Administered 2020-10-11 – 2020-10-17 (×9): 5 mg via ORAL
  Filled 2020-10-11 (×9): qty 1

## 2020-10-11 MED ORDER — DILTIAZEM HCL ER COATED BEADS 180 MG PO CP24
300.0000 mg | ORAL_CAPSULE | Freq: Every day | ORAL | Status: DC
  Administered 2020-10-12 – 2020-10-17 (×6): 300 mg via ORAL
  Filled 2020-10-11 (×6): qty 1

## 2020-10-11 MED ORDER — ONDANSETRON HCL 4 MG/2ML IJ SOLN: 4.0000 mg | Freq: Four times a day (QID) | INTRAMUSCULAR | Status: DC | PRN

## 2020-10-11 MED ORDER — METOPROLOL SUCCINATE ER 50 MG PO TB24
200.0000 mg | ORAL_TABLET | Freq: Every day | ORAL | Status: DC
  Administered 2020-10-12 – 2020-10-17 (×6): 200 mg via ORAL
  Filled 2020-10-11 (×8): qty 4

## 2020-10-11 MED ORDER — INSULIN ASPART 100 UNIT/ML IJ SOLN: 0.0000 [IU] | Freq: Every day | INTRAMUSCULAR | Status: DC

## 2020-10-11 NOTE — H&P (Signed)
History and Physical    Jasmin Martin JKK:[id number] DOB: 1956/02/16 DOA: 10/11/2020  PCP: Jasmin Martin [facility]   Patient coming from: Home  I have personally briefly reviewed patient's old medical records in [facility]  Chief Complaint: Difficulty breathing, leg swelling  HPI: Jasmin Martin is a 65 y.o. female with medical history significant for atrial fibrillation, diastolic CHF and hypertension, chronic respiratory failure on 2 L Patient presented to the ED with complaints of progressive difficulty breathing with exertion and leg swelling particularly worse over the past 3 weeks.  She reports about 20 pound weight gain over the past 4 months.  She reports left-sided chest heaviness with activity.  She reports paroxysmal nocturnal dyspnea, and is hard to breathe even with the head of her hospital bed to be 30.  She states she is unable to walk from her kitchen to her bedroom without becoming severely short of breath.   She reports she reports compliance with Eliquis twice daily.  Patient is also on torsemide which she was previously taking 50 mg every other day, but over the past 3 weeks she has been taking it twice every day, without significant improvement in her symptoms.  ED Course: Sats greater than 94% on 2 L.  Blood pressure systolic 106-150s.  Magnesium 1.5.  BNP 161.  Magnesium 1.5.  Port Chest x-ray without acute abnormality.  IV Lasix 80 mg x 1 given.  Hospitalist to admit.  Review of Systems: As per HPI all other systems reviewed and negative.  Past Medical History:  Diagnosis Date   Arthritis    Atrial fibrillation (HCC)    a. on Eliquis   Bilateral knee pain    CHF (congestive heart failure) (HCC)    Depression    DJD (degenerative joint disease)    HTN (hypertension)    Insomnia    Obesity, morbid (HCC)    Persistent atrial fibrillation (HCC)    Renal insufficiency     Past Surgical History:  Procedure Laterality Date   ABDOMINAL HYSTERECTOMY      bilateral knee surgery     CESAREAN SECTION     CHOLECYSTECTOMY  2001   COLONOSCOPY  2007   Dr. Jena Gauss: friable anal canal, few scattered left-sided diverticula, benign colonic biopsies    right ankle surgery     TONSILLECTOMY     VESICOVAGINAL FISTULA CLOSURE W/ TAH       reports that she has never smoked. She has never used smokeless tobacco. She reports that she does not drink alcohol and does not use drugs.  No Known Allergies  Family History  Problem Relation Age of Onset   Cancer Father        Lung    CVA Son    Colon cancer Neg Hx     Prior to Admission medications   Medication Sig Start Date End Date Taking? Authorizing Provider  albuterol (VENTOLIN HFA) 108 (90 Base) MCG/ACT inhaler Inhale 2 puffs into the lungs every 6 (six) hours as needed for wheezing or shortness of breath.   Yes [provider]  apixaban (ELIQUIS) 5 MG TABS tablet Take 5 mg by mouth 2 (two) times daily.   Yes [provider]  diltiazem (CARDIZEM CD) 300 MG 24 hr capsule TAKE ONE CAPSULE BY MOUTH ONCE DAILY. 01/29/20  Yes BranchDorothe Pea, MD  hydrOXYzine (ATARAX/VISTARIL) 25 MG tablet Take 25 mg by mouth every 8 (eight) hours as needed. 06/07/20  Yes [provider]  lisinopril (ZESTRIL) 20 MG tablet TAKE ONE TABLET BY MOUTH ONCE DAILY. 06/30/20  Yes BranchDorothe Pea, MD  metFORMIN (GLUCOPHAGE) 500 MG tablet Take 500 mg by mouth every morning. 10/06/20  Yes [provider]  metoprolol (TOPROL-XL) 200 MG 24 hr tablet TAKE (1) TABLET BY MOUTH ONCE DAILY. 08/28/19  Yes Branch, Dorothe Pea, MD  oxyCODONE-acetaminophen (PERCOCET) 10-325 MG per tablet Take 1 tablet by mouth every 6 (six) hours.   Yes [provider]  OXYGEN Inhale 2 L into the lungs daily.   Yes [provider]  potassium gluconate 595 (99 K) MG TABS tablet Take 595 mg by mouth.   Yes [provider]  pravastatin (PRAVACHOL) 40 MG tablet Take 40 mg by mouth daily.   Yes [provider]  torsemide (DEMADEX) 100 MG tablet TAKE 1/2 TABLET BY MOUTH ONCE DAILY. Patient taking differently: Take 100 mg by mouth daily. TAKE 1/2 TABLET BY MOUTH ONCE DAILY. Can take 1/2 tablet every evening as needed for fluid 04/26/20  Yes Branch, Dorothe Pea, MD  doxycycline (VIBRAMYCIN) 100 MG capsule Take 1 capsule (100 mg total) by mouth 2 (two) times daily. One po bid x 7 days Patient not taking: No sig reported 08/08/20   Geoffery Lyons, MD  pantoprazole (PROTONIX) 40 MG tablet TAKE 1 TABLET BY MOUTH 30 MINUTES BEFORE BREAKFAST. Patient not taking: No sig reported 11/27/18   Anice Paganini, NP  predniSONE (DELTASONE) 20 MG tablet Take 2 tablets (40 mg total) by mouth daily. Patient not taking: No sig reported 09/14/20   Mardella Layman, MD    Physical Exam: Vitals:   10/11/20 1700 10/11/20 1730 10/11/20 1800 10/11/20 1830  BP: 117/72 (!) 144/117 (!) 122/39 (!) 117/55  Pulse: 79 80 78 73  Resp: 19 17 19 18   Temp:      TempSrc:      SpO2: 100% 100% 100% 100%  Weight:      Height:        Constitutional: NAD, calm, comfortable Vitals:   10/11/20 1700 10/11/20 1730 10/11/20 1800 10/11/20 1830  BP: 117/72 (!) 144/117 (!) 122/39 (!) 117/55  Pulse: 79 80 78 73  Resp: 19 17 19 18   Temp:      TempSrc:      SpO2: 100% 100% 100% 100%  Weight:      Height:       Eyes: PERRL, lids and conjunctivae normal ENMT: Mucous membranes are moist.  Neck: normal, supple, no masses, no thyromegaly Respiratory: clear to auscultation bilaterally, no wheezing, no crackles. Normal respiratory effort. No accessory muscle use.  Cardiovascular: Regular rate and rhythm, no murmurs / rubs / gallops. 1 + pitting extremity edema to knees. 2+ pedal pulses.   Abdomen: no tenderness, no masses palpated. No hepatosplenomegaly. Bowel sounds positive.  Musculoskeletal: no clubbing / cyanosis. No joint deformity upper and lower extremities. Good ROM, no contractures. Normal muscle tone.  Skin: no rashes, lesions,  ulcers. No induration Neurologic: No apparent cranial nerve abnormality, moving all extremities spontaneously Psychiatric: Normal judgment and insight. Alert and oriented x 3. Normal mood.   Labs on Admission: I have personally reviewed following labs and imaging studies  CBC: Recent Labs  Lab 10/11/20 1719  WBC 6.5  HGB 12.3  HCT 38.2  MCV 104.9*  PLT 250   Basic Metabolic Panel: Recent Labs  Lab 10/11/20 1719  NA 140  K 4.0  CL 101  CO2 30  GLUCOSE 139*  BUN 12  CREATININE  0.76  CALCIUM 8.3*  MG 1.5*    Radiological Exams on Admission: DG Chest Portable 1 View  Result Date: 10/11/2020 CLINICAL DATA:  Swelling and shortness of breath. EXAM: PORTABLE CHEST 1 VIEW COMPARISON:  PA and lateral chest 09/14/2020. Single-view of the chest 08/07/2020. FINDINGS: Lungs are clear. Cardiomegaly. Aortic atherosclerosis. No pneumothorax or pleural fluid. IMPRESSION: No acute disease. Cardiomegaly. Aortic Atherosclerosis (ICD10-I70.0). Electronically Signed   By: Drusilla Kanner M.D.   On: 10/11/2020 18:56    EKG: Independently reviewed.   Assessment/Plan Principal Problem:   Acute on chronic diastolic CHF (congestive heart failure) (HCC) Active Problems:   Morbid obesity (HCC)   Hypertension   FIBRILLATION, ATRIAL   Long term current use of anticoagulant   Acute on chronic diastolic CHF-leg swelling, dyspnea on exertion, PND but chest x-ray unremarkable, BNP elevated at 161 compared to 138 -2 months ago.  Chart confirms ~ 20 pound weight gain in the since this past May. Last echo 2016 EF 55 to 60%.  Compliance with Eliquis.  Has been taking 50 mg of of torsemide twice daily.  With Dr. Wyline Mood. -IV Lasix 80 mg x 1 given in ED, continue 60 twice daily Strict input output, daily weights  -Obtain updated echocardiogram -Hold home torsemide -Troponins x2  Atrial fibrillation - rate controlled and on anticoagulation. -Resume Eliquis - Resume metoprolol, diltiazem  Diabetes  mellitus-random glucose 139 -Hold metformin for now - SSI- S - HgbA1c  Chronic respiratory failure on 2 L O2 at night  Hypertension- Stable.  -Resume metoprolol, Cardizem in the morning  Morbid obesity    DVT prophylaxis: Eliquis Code Status: Full code Family Communication: None at bedside Disposition Plan: > 2 days Consults called: None Admission status: Inpt tele I certify that at the point of admission it is my clinical judgment that the patient will require inpatient hospital care spanning beyond 2 midnights from the point of admission due to high intensity of service, high risk for further deterioration and high frequency of surveillance required.    Onnie Boer MD [facility]  10/11/2020, 10:34 PM

## 2020-10-11 NOTE — ED Triage Notes (Signed)
Pt c/o excessive swelling and SHOB. Pt states she had increased her fluid pill with no improvement. Pt on 2L [place] chronically at home.

## 2020-10-11 NOTE — ED Provider Notes (Addendum)
[facility] EMERGENCY DEPARTMENT Provider Note   CSN: [id number] Arrival date & time: 10/11/20  1520     History Chief Complaint  Patient presents with   Shortness of Breath    Jasmin Martin is a 65 y.o. female.  HPI     65 year old female with history of A. fib on Eliquis, CHF, morbid obesity with oxygen requirement comes in with chief complaint of shortness of breath.  She reports that she has had gradual swelling in her legs and shortness of breath that has gotten worse.  At baseline, she is able to walk from 1 room to another, now moving in her house gets her short of breath.  Additionally she is also having orthopnea, PND-like symptoms and she has noted increased leg swelling with weight gain of about 20+ pounds in the last 4 months.  Denies any new cough, fevers, COVID-19 exposures.  Saw her PCP today who advised that she come to the hospital for admission.  Of note, patient normally takes as needed Lasix.  For the last 2 weeks she has been taking it regularly and despite that has not seen any change  Past Medical History:  Diagnosis Date   Arthritis    Atrial fibrillation (HCC)    a. on Eliquis   Bilateral knee pain    CHF (congestive heart failure) (HCC)    Depression    DJD (degenerative joint disease)    HTN (hypertension)    Insomnia    Obesity, morbid (HCC)    Persistent atrial fibrillation [facility])    Renal insufficiency     Patient Active Problem List   Diagnosis Date Noted   Elevated LFTs 07/22/2015   Chest pain 06/16/2014   Hypotension 06/16/2014   Acute kidney injury (HCC) 06/16/2014   Encounter for therapeutic drug monitoring 04/27/2013   OA (osteoarthritis) of knee 05/22/2012   Neuropathy 05/22/2012   Diastolic CHF, chronic (HCC) 07/31/2011   Weakness of both legs 05/22/2011   Long term current use of anticoagulant 06/30/2010   TENOSYNOVITIS OF FOOT AND ANKLE 03/01/2010   STRESS FRACTURE, FOOT 03/01/2010   MUSCLE CRAMPS 10/18/2009   Morbid  obesity (HCC) 08/12/2009   Anxiety state 07/18/2009   DYSPNEA 07/18/2009   PALPITATIONS 06/15/2009   Hypertension 06/02/2009   HYPOKALEMIA 05/31/2009   FIBRILLATION, ATRIAL 05/31/2009   PNEUMONIA 05/31/2009   INFLUENZA 05/31/2009    Past Surgical History:  Procedure Laterality Date   ABDOMINAL HYSTERECTOMY     bilateral knee surgery     CESAREAN SECTION     CHOLECYSTECTOMY  2001   COLONOSCOPY  2007   Dr. Jena Gauss: friable anal canal, few scattered left-sided diverticula, benign colonic biopsies    right ankle surgery     TONSILLECTOMY     VESICOVAGINAL FISTULA CLOSURE W/ TAH       OB History   No obstetric history on file.     Family History  Problem Relation Age of Onset   Cancer Father        Lung    CVA Son    Colon cancer Neg Hx     Social History   Tobacco Use   Smoking status: Never   Smokeless tobacco: Never   Tobacco comments:    Never smoked  Vaping Use   Vaping Use: Never used  Substance Use Topics   Alcohol use: No    Alcohol/week: 0.0 standard drinks   Drug use: No    Home Medications Prior to Admission medications  Medication Sig Start Date End Date Taking? Authorizing Provider  albuterol (VENTOLIN HFA) 108 (90 Base) MCG/ACT inhaler Inhale 2 puffs into the lungs every 6 (six) hours as needed for wheezing or shortness of breath.   Yes [provider]  apixaban (ELIQUIS) 5 MG TABS tablet Take 5 mg by mouth 2 (two) times daily.   Yes [provider]  diltiazem (CARDIZEM CD) 300 MG 24 hr capsule TAKE ONE CAPSULE BY MOUTH ONCE DAILY. 01/29/20  Yes BranchDorothe Pea, MD  hydrOXYzine (ATARAX/VISTARIL) 25 MG tablet Take 25 mg by mouth every 8 (eight) hours as needed. 06/07/20  Yes [provider]  lisinopril (ZESTRIL) 20 MG tablet TAKE ONE TABLET BY MOUTH ONCE DAILY. 06/30/20  Yes BranchDorothe Pea, MD  metFORMIN (GLUCOPHAGE) 500 MG tablet Take 500 mg by mouth every morning. 10/06/20  Yes [provider]  metoprolol  (TOPROL-XL) 200 MG 24 hr tablet TAKE (1) TABLET BY MOUTH ONCE DAILY. 08/28/19  Yes Branch, Dorothe Pea, MD  oxyCODONE-acetaminophen (PERCOCET) 10-325 MG per tablet Take 1 tablet by mouth every 6 (six) hours.   Yes [provider]  OXYGEN Inhale 2 L into the lungs daily.   Yes [provider]  potassium gluconate 595 (99 K) MG TABS tablet Take 595 mg by mouth.   Yes [provider]  pravastatin (PRAVACHOL) 40 MG tablet Take 40 mg by mouth daily.   Yes [provider]  torsemide (DEMADEX) 100 MG tablet TAKE 1/2 TABLET BY MOUTH ONCE DAILY. Patient taking differently: Take 100 mg by mouth daily. TAKE 1/2 TABLET BY MOUTH ONCE DAILY. Can take 1/2 tablet every evening as needed for fluid 04/26/20  Yes Branch, Dorothe Pea, MD  doxycycline (VIBRAMYCIN) 100 MG capsule Take 1 capsule (100 mg total) by mouth 2 (two) times daily. One po bid x 7 days Patient not taking: No sig reported 08/08/20   Geoffery Lyons, MD  pantoprazole (PROTONIX) 40 MG tablet TAKE 1 TABLET BY MOUTH 30 MINUTES BEFORE BREAKFAST. Patient not taking: No sig reported 11/27/18   Anice Paganini, NP  predniSONE (DELTASONE) 20 MG tablet Take 2 tablets (40 mg total) by mouth daily. Patient not taking: No sig reported 09/14/20   Mardella Layman, MD    Allergies    Patient has no known allergies.  Review of Systems   Review of Systems  Constitutional:  Positive for activity change.  All other systems reviewed and are negative.  Physical Exam Updated Vital Signs BP (!) 117/55   Pulse 73   Temp 97.9 F (36.6 C) (Oral)   Resp 18   Ht 5\' 4"  (1.626 m)   Wt (!) 176.9 kg   SpO2 100%   BMI 66.94 kg/m   Physical Exam  ED Results / Procedures / Treatments   Labs (all labs ordered are listed, but only abnormal results are displayed) Labs Reviewed  BASIC METABOLIC PANEL - Abnormal; Notable for the following components:      Result Value   Glucose, Bld 139 (*)    Calcium 8.3 (*)    All other components within  normal limits  MAGNESIUM - Abnormal; Notable for the following components:   Magnesium 1.5 (*)    All other components within normal limits  BRAIN NATRIURETIC PEPTIDE - Abnormal; Notable for the following components:   B Natriuretic Peptide 161.0 (*)    All other components within normal limits  CBC - Abnormal; Notable for the following components:   RBC 3.64 (*)  MCV 104.9 (*)    All other components within normal limits  SARS CORONAVIRUS 2 (TAT 6-24 HRS)    EKG EKG Interpretation  Date/Time:  Tuesday October 11 2020 15:55:20 EDT Ventricular Rate:  75 PR Interval:    QRS Duration: 105 QT Interval:  425 QTC Calculation: 475 R Axis:   120 Text Interpretation: Atrial fibrillation Left posterior fascicular block Low voltage, precordial leads Abnormal R-wave progression, late transition Borderline T wave abnormalities No acute changes No significant change since last tracing Confirmed by Derwood Kaplan [phone]) on 10/11/2020 4:46:05 PM  Radiology DG Chest Portable 1 View  Result Date: 10/11/2020 CLINICAL DATA:  Swelling and shortness of breath. EXAM: PORTABLE CHEST 1 VIEW COMPARISON:  PA and lateral chest 09/14/2020. Single-view of the chest 08/07/2020. FINDINGS: Lungs are clear. Cardiomegaly. Aortic atherosclerosis. No pneumothorax or pleural fluid. IMPRESSION: No acute disease. Cardiomegaly. Aortic Atherosclerosis (ICD10-I70.0). Electronically Signed   By: Drusilla Kanner M.D.   On: 10/11/2020 18:56    Procedures Procedures   The cardiac monitor revealed normal sinus rhythm as interpreted by me. The cardiac monitor was ordered secondary to the patient's history of chest pain and to monitor the patient for dysrhythmia.  Chest x-ray independently reviewed by me.  Medications Ordered in ED Medications  furosemide (LASIX) injection 80 mg (has no administration in time range)    ED Course  I have reviewed the triage vital signs and the nursing notes.  Pertinent labs & imaging  results that were available during my care of the patient were reviewed by me and considered in my medical decision making (see chart for details).    MDM Rules/Calculators/A&P                           65 year old female with history of CHF, A. fib comes in a chief complaint of worsening shortness of breath, orthopnea and 20+ pound weight gain.  She has increased her Lasix consumption over the last 2 weeks, despite that there has not been any significant change.  Advised her to come by PCP.  Secondary to her elevated BMI, exam is limited.  I did not appreciate any rales or wheezing.  X-rays ordered.  She does have 3+ pitting edema in bilateral lower extremity.  No signs of DVT.  Will admit her to the hospital for diuresing purposes.  IV Lasix given in the ER.  Final Clinical Impression(s) / ED Diagnoses Final diagnoses:  Acute systolic congestive heart failure [facility])    Rx / DC Orders ED Discharge Orders     None        Derwood Kaplan, MD 10/11/20 Rosaland Lao, MD 10/11/20 1932

## 2020-10-12 ENCOUNTER — Inpatient Hospital Stay (HOSPITAL_COMMUNITY): Payer: Medicare Other

## 2020-10-12 DIAGNOSIS — I5033 Acute on chronic diastolic (congestive) heart failure: Secondary | ICD-10-CM

## 2020-10-12 DIAGNOSIS — Z7901 Long term (current) use of anticoagulants: Secondary | ICD-10-CM

## 2020-10-12 DIAGNOSIS — I4891 Unspecified atrial fibrillation: Secondary | ICD-10-CM

## 2020-10-12 DIAGNOSIS — I10 Essential (primary) hypertension: Secondary | ICD-10-CM

## 2020-10-12 LAB — BASIC METABOLIC PANEL
Anion gap: 15 (ref 5–15)
BUN: 11 mg/dL (ref 8–23)
CO2: 27 mmol/L (ref 22–32)
Calcium: 8.3 mg/dL — ABNORMAL LOW (ref 8.9–10.3)
Chloride: 99 mmol/L (ref 98–111)
Creatinine, Ser: 0.66 mg/dL (ref 0.44–1.00)
GFR, Estimated: >60 mL/min (ref >60)
Glucose, Bld: 127 mg/dL — ABNORMAL HIGH (ref 70–99)
Potassium: 3.2 mmol/L — ABNORMAL LOW (ref 3.5–5.1)
Sodium: 141 mmol/L (ref 135–145)

## 2020-10-12 LAB — ECHOCARDIOGRAM COMPLETE
AR max vel: 1.63 cm2
AV Area VTI: 1.41 cm2
AV Area mean vel: 1.59 cm2
AV Mean grad: 13 mmHg
AV Peak grad: 25 mmHg
Ao pk vel: 2.5 m/s
Area-P 1/2: 2.62 cm2
Height: 64 in
S' Lateral: 3.29 cm
Weight: 6239.9 oz

## 2020-10-12 LAB — TROPONIN I (HIGH SENSITIVITY): Troponin I (High Sensitivity): 12 ng/L (ref <18)

## 2020-10-12 LAB — SARS CORONAVIRUS 2 (TAT 6-24 HRS): SARS Coronavirus 2: NEGATIVE

## 2020-10-12 LAB — HEMOGLOBIN A1C
Hgb A1c MFr Bld: 7.1 % — ABNORMAL HIGH (ref 4.8–5.6)
Mean Plasma Glucose: 157.07 mg/dL

## 2020-10-12 LAB — CBC
HCT: 35.8 % — ABNORMAL LOW (ref 36.0–46.0)
Hemoglobin: 11.5 g/dL — ABNORMAL LOW (ref 12.0–15.0)
MCH: 33.4 pg (ref 26.0–34.0)
MCHC: 32.1 g/dL (ref 30.0–36.0)
MCV: 104.1 fL — ABNORMAL HIGH (ref 80.0–100.0)
Platelets: 219 10*3/uL (ref 150–400)
RBC: 3.44 MIL/uL — ABNORMAL LOW (ref 3.87–5.11)
RDW: 14.1 % (ref 11.5–15.5)
WBC: 6.4 10*3/uL (ref 4.0–10.5)
nRBC: 0 % (ref 0.0–0.2)

## 2020-10-12 LAB — HIV ANTIBODY (ROUTINE TESTING W REFLEX): HIV Screen 4th Generation wRfx: NONREACTIVE

## 2020-10-12 LAB — MAGNESIUM: Magnesium: 1.8 mg/dL (ref 1.7–2.4)

## 2020-10-12 MED ORDER — LOPERAMIDE HCL 2 MG PO CAPS
2.0000 mg | ORAL_CAPSULE | ORAL | Status: DC | PRN
  Administered 2020-10-12 – 2020-10-16 (×2): 2 mg via ORAL
  Filled 2020-10-12 (×2): qty 1

## 2020-10-12 MED ORDER — POTASSIUM CHLORIDE CRYS ER 20 MEQ PO TBCR
40.0000 meq | EXTENDED_RELEASE_TABLET | Freq: Once | ORAL | Status: AC
  Administered 2020-10-12: 40 meq via ORAL
  Filled 2020-10-12: qty 2

## 2020-10-12 MED ORDER — LIVING BETTER WITH HEART FAILURE BOOK: Freq: Once | Status: AC

## 2020-10-12 MED ORDER — IPRATROPIUM-ALBUTEROL 0.5-2.5 (3) MG/3ML IN SOLN: 3.0000 mL | RESPIRATORY_TRACT | Status: DC | PRN

## 2020-10-12 MED ORDER — DIPHENHYDRAMINE-ZINC ACETATE 2-0.1 % EX CREA
TOPICAL_CREAM | Freq: Two times a day (BID) | CUTANEOUS | Status: DC | PRN
  Filled 2020-10-12: qty 28

## 2020-10-12 MED ORDER — MENTHOL 3 MG MT LOZG
1.0000 | LOZENGE | OROMUCOSAL | Status: DC | PRN
  Administered 2020-10-12: 3 mg via ORAL
  Filled 2020-10-12: qty 9

## 2020-10-12 NOTE — Progress Notes (Signed)
*  PRELIMINARY RESULTS* Echocardiogram 2D Echocardiogram has been performed [place].  Stacey Drain 10/12/2020, 4:34 PM

## 2020-10-12 NOTE — Progress Notes (Signed)
PROGRESS NOTE  MATTESON BLUE  JSC:[id number] DOB: [birth year] DOA: 10/11/2020 PCP: [facility]   Brief Narrative: Jasmin Martin is a 65 y.o. female with a history of HFpEF, atrial fibrillation, chronic 2L O2-dependent respiratory failure, HTN, and morbid obesity who presented to the ED 7/12 with progressive exertional dyspnea and leg swelling associated with 20 lbs weight gain despite augmenting torsemide dosing for 3 weeks. Respiratory status was stable on home 2L O2, but appeared grossly volume overloaded. IV diuresis was initiated and echocardiogram is pending.   Assessment & Plan: Principal Problem:   Acute on chronic diastolic CHF (congestive heart failure) (HCC) Active Problems:   Morbid obesity (HCC)   Hypertension   FIBRILLATION, ATRIAL   Long term current use of anticoagulant  Acute on chronic HFpEF: ?tachycardia mediated, though not in RVR currently.  - Continue IV lasix. Subjectively high UOP per pt - Updating echo (pending) - Strict I/O, monitor BMP and electrolytes, continue telemetry.  - Continue daily weights. Confirmed to be 20lbs up since May 2022.   Chronic paroxysmal atrial fibrillation:  - Continue metoprolol succinate 200mg  daily, diltiazem 300mg  daily - Continue eliquis  HTN: Continue BB, CCB, and lisinopril as above.   T2DM: Well-controlled with HbA1c 7.1%.  - Hold metformin, continue SSI  Chronic hypoxic respiratory failure:  - continue 2L O2. Was increased from 1L a couple months ago. May attempt to wean to 1L toward end of hospitalization.   HLD:  - Continue statin  GERD:  - Continue PPI   Morbid obesity: Estimated body mass index is 66.94 kg/m as calculated from the following:   Height as of this encounter: 5\' 4"  (1.626 m).   Weight as of this encounter: 176.9 kg.  DVT prophylaxis: Eliquis Code Status: Full Family Communication: Ex-husband at bedside Disposition Plan:  Status is: Inpatient  Remains inpatient appropriate  because:Inpatient level of care appropriate due to severity of illness  Dispo: The patient is from: Home              Anticipated d/c is to: Home              Patient currently is not medically stable to d/c.   Difficult to place patient No  Consultants:  None  Procedures:  Echocardiogram   Antimicrobials: None   Subjective: Feels about the same, still severely swollen compared to baseline in legs. No chest pain. Remains exertionally dyspneic with some chest pressure intermittently, none currently. Making more than usual urine.   Objective: Vitals:   10/12/20 0500 10/12/20 0506 10/12/20 0932 10/12/20 1415  BP:  (!) 111/58 122/66 (!) 105/52  Pulse:  70 80 63  Resp:  19  18  Temp:  97.9 F (36.6 C)  97.7 F (36.5 C)  TempSrc:  Oral  Oral  SpO2:  96% 95% 97%  Weight: (!) 176.9 kg     Height:        Intake/Output Summary (Last 24 hours) at 10/12/2020 1644 Last data filed at 10/12/2020 1300 Gross per 24 hour  Intake 2675 ml  Output 1500 ml  Net 1175 ml   Filed Weights   10/11/20 1533 10/12/20 0500  Weight: (!) 176.9 kg (!) 176.9 kg    Gen: 65 y.o. female in no distress Pulm: Non-labored breathing 2L O2. Clear to auscultation bilaterally.  CV: Irreg irreg. No murmur, rub, or gallop. UTD JVD, 2+ pitting dependent edema. GI: Abdomen soft, non-tender, non-distended, with normoactive bowel sounds. No organomegaly or masses felt.  Ext: Warm, no deformities Skin: No rashes, lesions or ulcers on visualized skin. Neuro: Alert and oriented. No focal neurological deficits. Psych: Judgement and insight appear normal. Mood & affect appropriate.   Data Reviewed: I have personally reviewed following labs and imaging studies  CBC: Recent Labs  Lab 10/11/20 1719 10/12/20 0418  WBC 6.5 6.4  HGB 12.3 11.5*  HCT 38.2 35.8*  MCV 104.9* 104.1*  PLT 250 219   Basic Metabolic Panel: Recent Labs  Lab 10/11/20 1719 10/12/20 0418  NA 140 141  K 4.0 3.2*  CL 101 99  CO2 30 27   GLUCOSE 139* 127*  BUN 12 11  CREATININE 0.76 0.66  CALCIUM 8.3* 8.3*  MG 1.5* 1.8   GFR: Estimated Creatinine Clearance: 111.6 mL/min (by C-G formula based on SCr of 0.66 mg/dL). Liver Function Tests: No results for input(s): AST, ALT, ALKPHOS, BILITOT, PROT, ALBUMIN in the last 168 hours. No results for input(s): LIPASE, AMYLASE in the last 168 hours. No results for input(s): AMMONIA in the last 168 hours. Coagulation Profile: No results for input(s): INR, PROTIME in the last 168 hours. Cardiac Enzymes: No results for input(s): CKTOTAL, CKMB, CKMBINDEX, TROPONINI in the last 168 hours. BNP (last 3 results) No results for input(s): PROBNP in the last 8760 hours. HbA1C: Recent Labs    10/11/20 2217  HGBA1C 7.1*   CBG: Recent Labs  Lab 10/11/20 2214  GLUCAP 125*   Lipid Profile: No results for input(s): CHOL, HDL, LDLCALC, TRIG, CHOLHDL, LDLDIRECT in the last 72 hours. Thyroid Function Tests: No results for input(s): TSH, T4TOTAL, FREET4, T3FREE, THYROIDAB in the last 72 hours. Anemia Panel: No results for input(s): VITAMINB12, FOLATE, FERRITIN, TIBC, IRON, RETICCTPCT in the last 72 hours. Urine analysis:    Component Value Date/Time   COLORURINE AMBER BIOCHEMICALS MAY BE AFFECTED BY COLOR (A) 05/23/2009 2020   APPEARANCEUR CLEAR 05/23/2009 2020   LABSPEC >1.030 (H) 05/23/2009 2020   PHURINE 5.5 05/23/2009 2020   GLUCOSEU 100 (A) 05/23/2009 2020   HGBUR MODERATE (A) 05/23/2009 2020   BILIRUBINUR MODERATE (A) 05/23/2009 2020   KETONESUR NEGATIVE 05/23/2009 2020   PROTEINUR >300 (A) 05/23/2009 2020   UROBILINOGEN 0.2 05/23/2009 2020   NITRITE POSITIVE (A) 05/23/2009 2020   LEUKOCYTESUR NEGATIVE 05/23/2009 2020   Recent Results (from the past 240 hour(s))  SARS CORONAVIRUS 2 (TAT 6-24 HRS) Nasopharyngeal Nasopharyngeal Swab     Status: None   Collection Time: 10/11/20  5:20 PM   Specimen: Nasopharyngeal Swab  Result Value Ref Range Status   SARS Coronavirus 2  NEGATIVE NEGATIVE Final    Comment: (NOTE) SARS-CoV-2 target nucleic acids are NOT DETECTED.  The SARS-CoV-2 RNA is generally detectable in upper and lower respiratory specimens during the acute phase of infection. Negative results do not preclude SARS-CoV-2 infection, do not rule out co-infections with other pathogens, and should not be used as the sole basis for treatment or other patient management decisions. Negative results must be combined with clinical observations, patient history, and epidemiological information. The expected result is Negative.  Fact Sheet for Patients: HairSlick.no  Fact Sheet for Healthcare Providers: quierodirigir.com  This test is not yet approved or cleared by the [place] FDA and  has been authorized for detection and/or diagnosis of SARS-CoV-2 by FDA under an Emergency Use Authorization (EUA). This EUA will remain  in effect (meaning this test can be used) for the duration of the COVID-19 declaration under Se ction 564(b)(1) of the Act, 21 U.S.C. section  360bbb-3(b)(1), unless the authorization is terminated or revoked sooner.  Performed at [facility], [address] [place], [place] [address]       Radiology Studies: DG Chest Portable 1 View  Result Date: 10/11/2020 CLINICAL DATA:  Swelling and shortness of breath. EXAM: PORTABLE CHEST 1 VIEW COMPARISON:  PA and lateral chest 09/14/2020. Single-view of the chest 08/07/2020. FINDINGS: Lungs are clear. Cardiomegaly. Aortic atherosclerosis. No pneumothorax or pleural fluid. IMPRESSION: No acute disease. Cardiomegaly. Aortic Atherosclerosis (ICD10-I70.0). Electronically Signed   By: Drusilla Kanner M.D.   On: 10/11/2020 18:56    Scheduled Meds:  apixaban  5 mg Oral BID   diltiazem  300 mg Oral Daily   furosemide  60 mg Intravenous BID   insulin aspart  0-5 Units Subcutaneous QHS   insulin aspart  0-9 Units Subcutaneous TID WC    lisinopril  20 mg Oral Daily   Living Better with Heart Failure Book   Does not apply Once   metoprolol  200 mg Oral Daily   pravastatin  40 mg Oral Daily   Continuous Infusions:   LOS: 1 day   Time spent: 35 minutes.  Tyrone Nine, MD [facility] [email or web address] 10/12/2020, 4:44 PM

## 2020-10-13 DIAGNOSIS — J9610 Chronic respiratory failure, unspecified whether with hypoxia or hypercapnia: Secondary | ICD-10-CM

## 2020-10-13 DIAGNOSIS — I4821 Permanent atrial fibrillation: Secondary | ICD-10-CM

## 2020-10-13 DIAGNOSIS — E785 Hyperlipidemia, unspecified: Secondary | ICD-10-CM

## 2020-10-13 LAB — GLUCOSE, CAPILLARY
Glucose-Capillary: 136 mg/dL — ABNORMAL HIGH (ref 70–99)
Glucose-Capillary: 140 mg/dL — ABNORMAL HIGH (ref 70–99)
Glucose-Capillary: 131 mg/dL — ABNORMAL HIGH (ref 70–99)
Glucose-Capillary: 151 mg/dL — ABNORMAL HIGH (ref 70–99)
Glucose-Capillary: 128 mg/dL — ABNORMAL HIGH (ref 70–99)
Glucose-Capillary: 151 mg/dL — ABNORMAL HIGH (ref 70–99)
Glucose-Capillary: 118 mg/dL — ABNORMAL HIGH (ref 70–99)
Glucose-Capillary: 119 mg/dL — ABNORMAL HIGH (ref 70–99)

## 2020-10-13 LAB — BASIC METABOLIC PANEL
Anion gap: 8 (ref 5–15)
BUN: 14 mg/dL (ref 8–23)
CO2: 34 mmol/L — ABNORMAL HIGH (ref 22–32)
Calcium: 8.1 mg/dL — ABNORMAL LOW (ref 8.9–10.3)
Chloride: 100 mmol/L (ref 98–111)
Creatinine, Ser: 0.8 mg/dL (ref 0.44–1.00)
GFR, Estimated: >60 mL/min (ref >60)
Glucose, Bld: 131 mg/dL — ABNORMAL HIGH (ref 70–99)
Potassium: 3.6 mmol/L (ref 3.5–5.1)
Sodium: 142 mmol/L (ref 135–145)

## 2020-10-13 MED ORDER — FUROSEMIDE 10 MG/ML IJ SOLN
80.0000 mg | Freq: Two times a day (BID) | INTRAMUSCULAR | Status: DC
  Administered 2020-10-13 – 2020-10-17 (×8): 80 mg via INTRAVENOUS
  Filled 2020-10-13 (×9): qty 8

## 2020-10-13 NOTE — Clinical Social Work Note (Signed)
Heart healthy living will book order placed for patient on 7/13//22.   Kamal Jurgens, Juleen China, LCSW

## 2020-10-13 NOTE — Progress Notes (Signed)
PROGRESS NOTE  Jasmin Martin  ZOX:[id number] DOB: 04-21-1955 DOA: 10/11/2020 PCP: [facility]   Brief Narrative: Jasmin Martin is a 65 y.o. female with a history of HFpEF, atrial fibrillation, chronic 2L O2-dependent respiratory failure, HTN, and morbid obesity who presented to the ED 7/12 with progressive exertional dyspnea and leg swelling associated with 20 lbs weight gain despite augmenting torsemide dosing for 3 weeks. Respiratory status was stable on home 2L O2, but appeared grossly volume overloaded. IV diuresis was initiated and echocardiogram revealed LVEF 55-60% without WMA, mild LVH w/indeterminate diastolic parameters. RV mildly enlarged with pulmonary HTN (PASP ), normal systolic function. Circumferential pericardial effusion said to be trivial. Mild AS, mild-mod TR, calcified AV with mild AS. IVC was dilated and blunted. Cardiology is consulted for assistance with diuresis.   Assessment & Plan: Principal Problem:   Acute on chronic diastolic CHF (congestive heart failure) (HCC) Active Problems:   Morbid obesity (HCC)   Hypertension   FIBRILLATION, ATRIAL   Long term current use of anticoagulant  Acute on chronic HFpEF: ?tachycardia mediated, though not in RVR currently.  - Continue IV lasix. Will increase dose to 80mg  IV BID and ask for further recommendations from cardiology.  - Net positive due to >2L intake yesterday. Initiate fluids restriction. Wt unchanged.  - Strict I/O, monitor BMP and electrolytes, continue telemetry.  - Continue daily weights.    Chronic paroxysmal atrial fibrillation:  - Continue metoprolol succinate 200mg  daily, diltiazem 300mg  daily - Continue eliquis  HTN: Continue BB, CCB, and lisinopril as above.   T2DM: Well-controlled with HbA1c 7.1%.  - Hold metformin, continue SSI  Chronic hypoxic respiratory failure:  - Continue 2L O2. Was increased from 1L a couple months ago. May attempt to wean to 1L toward end of  hospitalization.   HLD:  - Continue statin  GERD:  - Continue PPI   Morbid obesity: Estimated body mass index is 67.02 kg/m as calculated from the following:   Height as of this encounter: 5\' 4"  (1.626 m).   Weight as of this encounter: 177.1 kg.  DVT prophylaxis: Eliquis Code Status: Full Family Communication: None at bedside Disposition Plan:  Status is: Inpatient  Remains inpatient appropriate because:Inpatient level of care appropriate due to severity of illness  Dispo: The patient is from: Home              Anticipated d/c is to: Home              Patient currently is not medically stable to d/c.   Difficult to place patient No  Consultants:  None  Procedures:  Echocardiogram 10/12/2020: LVEF 55-60% without WMA, mild LVH w/indeterminate diastolic parameters. RV mildly enlarged with pulmonary HTN (PASP ), normal systolic function. Circumferential pericardial effusion said to be trivial. Mild AS, mild-mod TR, calcified AV with mild AS. IVC was dilated and blunted. Cardiology is consulted for assistance with diuresis.   Antimicrobials: None   Subjective: Swelling slightly improved per pt, still significant. Stays thirsty. No chest pain or dyspnea, still hypoxic with exertion more than baseline.   Objective: Vitals:   10/12/20 2240 10/13/20 0442 10/13/20 0500 10/13/20 0911  BP:  (!) 100/58  103/61  Pulse:  87  97  Resp:  19    Temp:  98.4 F (36.9 C)    TempSrc:  Oral    SpO2: 98% 95%  94%  Weight:   (!) 177.1 kg   Height:  Intake/Output Summary (Last 24 hours) at 10/13/2020 1053 Last data filed at 10/13/2020 0700 Gross per 24 hour  Intake 1360 ml  Output 900 ml  Net 460 ml   Filed Weights   10/11/20 1533 10/12/20 0500 10/13/20 0500  Weight: (!) 176.9 kg (!) 176.9 kg (!) 177.1 kg   Gen: 65 y.o. female in no distress Pulm: Nonlabored breathing supplemental oxygen, crackles bilaterally are decreased.  CV: Irreg irreg, II/VI systolic murmur at  LSB without, rub, or gallop. UTD JVD. GI: Abdomen soft, non-tender, non-distended, with normoactive bowel sounds.  Ext: Warm, no deformities. Obese but with pitting edema ~2+ symmetrically. Skin: No new rashes, lesions or ulcers on visualized skin. Neuro: Alert and oriented. No focal neurological deficits. Psych: Judgement and insight appear fair. Mood euthymic & affect congruent. Behavior is appropriate.    Data Reviewed: I have personally reviewed following labs and imaging studies  CBC: Recent Labs  Lab 10/11/20 1719 10/12/20 0418  WBC 6.5 6.4  HGB 12.3 11.5*  HCT 38.2 35.8*  MCV 104.9* 104.1*  PLT 250 219   Basic Metabolic Panel: Recent Labs  Lab 10/11/20 1719 10/12/20 0418 10/13/20 0528  NA 140 141 142  K 4.0 3.2* 3.6  CL 101 99 100  CO2 30 27 34*  GLUCOSE 139* 127* 131*  BUN 12 11 14   CREATININE 0.76 0.66 0.80  CALCIUM 8.3* 8.3* 8.1*  MG 1.5* 1.8  --    GFR: Estimated Creatinine Clearance: 111.7 mL/min (by C-G formula based on SCr of 0.8 mg/dL). Liver Function Tests: No results for input(s): AST, ALT, ALKPHOS, BILITOT, PROT, ALBUMIN in the last 168 hours. No results for input(s): LIPASE, AMYLASE in the last 168 hours. No results for input(s): AMMONIA in the last 168 hours. Coagulation Profile: No results for input(s): INR, PROTIME in the last 168 hours. Cardiac Enzymes: No results for input(s): CKTOTAL, CKMB, CKMBINDEX, TROPONINI in the last 168 hours. BNP (last 3 results) No results for input(s): PROBNP in the last 8760 hours. HbA1C: Recent Labs    10/11/20 2217  HGBA1C 7.1*   CBG: Recent Labs  Lab 10/11/20 2214 10/12/20 2100 10/13/20 0714  GLUCAP 125* 151* 128*   Lipid Profile: No results for input(s): CHOL, HDL, LDLCALC, TRIG, CHOLHDL, LDLDIRECT in the last 72 hours. Thyroid Function Tests: No results for input(s): TSH, T4TOTAL, FREET4, T3FREE, THYROIDAB in the last 72 hours. Anemia Panel: No results for input(s): VITAMINB12, FOLATE,  FERRITIN, TIBC, IRON, RETICCTPCT in the last 72 hours. Urine analysis:    Component Value Date/Time   COLORURINE AMBER BIOCHEMICALS MAY BE AFFECTED BY COLOR (A) 05/23/2009 2020   APPEARANCEUR CLEAR 05/23/2009 2020   LABSPEC >1.030 (H) 05/23/2009 2020   PHURINE 5.5 05/23/2009 2020   GLUCOSEU 100 (A) 05/23/2009 2020   HGBUR MODERATE (A) 05/23/2009 2020   BILIRUBINUR MODERATE (A) 05/23/2009 2020   KETONESUR NEGATIVE 05/23/2009 2020   PROTEINUR >300 (A) 05/23/2009 2020   UROBILINOGEN 0.2 05/23/2009 2020   NITRITE POSITIVE (A) 05/23/2009 2020   LEUKOCYTESUR NEGATIVE 05/23/2009 2020   Recent Results (from the past 240 hour(s))  SARS CORONAVIRUS 2 (TAT 6-24 HRS) Nasopharyngeal Nasopharyngeal Swab     Status: None   Collection Time: 10/11/20  5:20 PM   Specimen: Nasopharyngeal Swab  Result Value Ref Range Status   SARS Coronavirus 2 NEGATIVE NEGATIVE Final    Comment: (NOTE) SARS-CoV-2 target nucleic acids are NOT DETECTED.  The SARS-CoV-2 RNA is generally detectable in upper and lower respiratory specimens during the acute  phase of infection. Negative results do not preclude SARS-CoV-2 infection, do not rule out co-infections with other pathogens, and should not be used as the sole basis for treatment or other patient management decisions. Negative results must be combined with clinical observations, patient history, and epidemiological information. The expected result is Negative.  Fact Sheet for Patients: HairSlick.no  Fact Sheet for Healthcare Providers: quierodirigir.com  This test is not yet approved or cleared by the [place] FDA and  has been authorized for detection and/or diagnosis of SARS-CoV-2 by FDA under an Emergency Use Authorization (EUA). This EUA will remain  in effect (meaning this test can be used) for the duration of the COVID-19 declaration under Se ction 564(b)(1) of the Act, 21 U.S.C. section  360bbb-3(b)(1), unless the authorization is terminated or revoked sooner.  Performed at [facility], [address] [place], [place] [address]       Radiology Studies: DG Chest Portable 1 View  Result Date: 10/11/2020 CLINICAL DATA:  Swelling and shortness of breath. EXAM: PORTABLE CHEST 1 VIEW COMPARISON:  PA and lateral chest 09/14/2020. Single-view of the chest 08/07/2020. FINDINGS: Lungs are clear. Cardiomegaly. Aortic atherosclerosis. No pneumothorax or pleural fluid. IMPRESSION: No acute disease. Cardiomegaly. Aortic Atherosclerosis (ICD10-I70.0). Electronically Signed   By: Drusilla Kanner M.D.   On: 10/11/2020 18:56   ECHOCARDIOGRAM COMPLETE  Result Date: 10/12/2020    ECHOCARDIOGRAM REPORT   Patient Name:   DERIAN PFOST Date of Exam: 10/12/2020 Medical Rec #:  [id number]     Height:       64.0 in Accession #:    [id number]    Weight:       390.0 lb Date of Birth:  [birth year]     BSA:          2.598 m Patient Age:    27 years      BP:           105/52 mmHg Patient Gender: F             HR:           67 bpm. [facility] Procedure: 2D Echo, Cardiac Doppler and Color Doppler Indications:    Congestive Heart Failure I50.9  History:        Patient has prior history of Echocardiogram examinations, most                 recent 06/17/2014. CHF, Arrythmias:Atrial Fibrillation; Risk                 Factors:Hypertension. Long term current use of anticoagulant,                 Morbid Obesity, Palpitations.  Sonographer:    Mikki Harbor Referring Phys: [phone] Heloise Beecham EMOKPAE IMPRESSIONS  1. Left ventricular ejection fraction, by estimation, is 55 to 60%. The left ventricle has normal function. The left ventricle has no regional wall motion abnormalities. There is mild left ventricular hypertrophy. Left ventricular diastolic parameters are indeterminate.  2. Right ventricular systolic function is normal. The right ventricular size is mildly enlarged. There is mildly elevated  pulmonary artery systolic pressure.  3. The pericardial effusion is circumferential.  4. The mitral valve is abnormal. No evidence of mitral valve regurgitation. Mild mitral stenosis.  5. The tricuspid valve is abnormal. Tricuspid valve regurgitation is mild to moderate.  6. The aortic valve has an indeterminant number of cusps. There is moderate calcification of the aortic valve. There  is moderate thickening of the aortic valve. Aortic valve regurgitation is not visualized. Mild aortic valve stenosis. Aortic valve mean gradient measures 13.0 mmHg. Aortic valve peak gradient measures 25.0 mmHg. Aortic valve area, by VTI measures 1.41 cm.  7. Moderate pulmonary HTN, PASP is 41 mmHg.  8. The inferior vena cava is dilated in size with >50% respiratory variability, suggesting right atrial pressure of 8 mmHg. FINDINGS  Left Ventricle: Left ventricular ejection fraction, by estimation, is 55 to 60%. The left ventricle has normal function. The left ventricle has no regional wall motion abnormalities. The left ventricular internal cavity size was normal in size. There is  mild left ventricular hypertrophy. Left ventricular diastolic parameters are indeterminate. Right Ventricle: The ventricular septum is flattened in systole suggesting RV pressure overload. The right ventricular size is mildly enlarged. No increase in right ventricular wall thickness. Right ventricular systolic function is normal. There is mildly elevated pulmonary artery systolic pressure. The tricuspid regurgitant velocity is 2.86 m/s, and with an assumed right atrial pressure of 8 mmHg, the estimated right ventricular systolic pressure is 40.7 mmHg. Left Atrium: Left atrial size was normal in size. Right Atrium: Right atrial size was normal in size. Pericardium: Trivial pericardial effusion is present. The pericardial effusion is circumferential. Mitral Valve: The mitral valve is abnormal. There is moderate thickening of the mitral valve leaflet(s).  There is moderate calcification of the mitral valve leaflet(s). Mild to moderate mitral annular calcification. No evidence of mitral valve regurgitation. Mild mitral valve stenosis. MV peak gradient, 13.7 mmHg. The mean mitral valve gradient is 5.0 mmHg. Tricuspid Valve: The tricuspid valve is abnormal. Tricuspid valve regurgitation is mild to moderate. No evidence of tricuspid stenosis. Aortic Valve: The aortic valve has an indeterminant number of cusps. There is moderate calcification of the aortic valve. There is moderate thickening of the aortic valve. There is moderate aortic valve annular calcification. Aortic valve regurgitation is not visualized. Mild aortic stenosis is present. Aortic valve mean gradient measures 13.0 mmHg. Aortic valve peak gradient measures 25.0 mmHg. Aortic valve area, by VTI measures 1.41 cm. Pulmonic Valve: The pulmonic valve was not well visualized. Pulmonic valve regurgitation is not visualized. No evidence of pulmonic stenosis. Aorta: The aortic root is normal in size and structure. Pulmonary Artery: Moderate pulmonary HTN, PASP is 41 mmHg. Venous: The inferior vena cava is dilated in size with greater than 50% respiratory variability, suggesting right atrial pressure of 8 mmHg. IAS/Shunts: No atrial level shunt detected by color flow Doppler.  LEFT VENTRICLE PLAX 2D LVIDd:         5.00 cm  Diastology LVIDs:         3.29 cm  LV e' medial:    10.70 cm/s LV PW:         1.22 cm  LV E/e' medial:  14.1 LV IVS:        1.03 cm  LV e' lateral:   11.10 cm/s LVOT diam:     2.00 cm  LV E/e' lateral: 13.6 LV SV:         90 LV SV Index:   35 LVOT Area:     3.14 cm  RIGHT VENTRICLE RV Basal diam:  4.86 cm RV Mid diam:    3.84 cm RV S prime:     9.37 cm/s TAPSE (M-mode): 2.7 cm LEFT ATRIUM             Index       RIGHT ATRIUM  Index LA diam:        4.70 cm 1.81 cm/m  RA Area:     25.90 cm LA Vol (A2C):   78.4 ml 30.17 ml/m RA Volume:   84.50 ml  32.52 ml/m LA Vol (A4C):   71.5 ml  27.52 ml/m LA Biplane Vol: 75.1 ml 28.90 ml/m  AORTIC VALVE AV Area (Vmax):    1.63 cm AV Area (Vmean):   1.59 cm AV Area (VTI):     1.41 cm AV Vmax:           250.00 cm/s AV Vmean:          170.000 cm/s AV VTI:            0.640 m AV Peak Grad:      25.0 mmHg AV Mean Grad:      13.0 mmHg LVOT Vmax:         129.82 cm/s LVOT Vmean:        85.786 cm/s LVOT VTI:          0.287 m LVOT/AV VTI ratio: 0.45  AORTA Ao Root diam: 2.80 cm MITRAL VALVE                TRICUSPID VALVE MV Area (PHT): 2.62 cm     TR Peak grad:   32.7 mmHg MV Peak grad:  13.7 mmHg    TR Vmax:        286.00 cm/s MV Mean grad:  5.0 mmHg MV Vmax:       1.85 m/s     SHUNTS MV Vmean:      106.0 cm/s   Systemic VTI:  0.29 m MV Decel Time: 289 msec     Systemic Diam: 2.00 cm MV E velocity: 151.00 cm/s Dina Rich MD Electronically signed by Dina Rich MD Signature Date/Time: 10/12/2020/4:47:44 PM    Final     Scheduled Meds:  apixaban  5 mg Oral BID   diltiazem  300 mg Oral Daily   furosemide  60 mg Intravenous BID   insulin aspart  0-5 Units Subcutaneous QHS   insulin aspart  0-9 Units Subcutaneous TID WC   lisinopril  20 mg Oral Daily   metoprolol  200 mg Oral Daily   pravastatin  40 mg Oral Daily   Continuous Infusions:   LOS: 2 days   Time spent: 35 minutes.  Tyrone Nine, MD [facility] [email or web address] 10/13/2020, 10:53 AM

## 2020-10-13 NOTE — Consult Note (Signed)
Cardiology Consultation:   Patient ID: Jasmin Martin MRN: [id number]; DOB: [birth year]  Admit date: 10/11/2020 Date of Consult: 10/13/2020  PCP:  Ponciano Ort [facility]   [facility] Providers Cardiologist:  Dina Rich, MD   {     Patient Profile:   Jasmin Martin is a 65 y.o. female with a hx of HFpEF, permanent atrial fibrillation, chronic O2 dependent respiratory failure, hypertension, and morbid obeisty who is being seen 10/13/2020 for the evaluation of worsening shortness of breath at the request of Tyrone Nine, MD.  History of Present Illness:   [name] reports increasing weight gain and leg swelling over a 3 week period. She states that she gained almost 20 lbs over this time. Despite increasing her home torsemide dose her symptoms continued to worsen. She also had difficulty sleeping at night. No chest pain, palpitations, pre-syncope or syncope. No N/V/diaphoresis. She has been compliant with her medications but does admit to drinking a lot of fluids daily.  CXR: no acute disease ECG: AFIB, rate 66 bpm, poor R progression in anterior leads Labs: Na 142, K 3.6, bluc 131, BUN 14, Cr 0.8 Echo: PASP 40, normal LV fx, EF 60%, mild AS, mild-mod TR  Has been treated with IV lasix with good response.   Past Medical History:  Diagnosis Date   Arthritis    Atrial fibrillation (HCC)    a. on Eliquis   Bilateral knee pain    CHF (congestive heart failure) (HCC)    Depression    DJD (degenerative joint disease)    HTN (hypertension)    Insomnia    Obesity, morbid (HCC)    Persistent atrial fibrillation (HCC)    Renal insufficiency     Past Surgical History:  Procedure Laterality Date   ABDOMINAL HYSTERECTOMY     bilateral knee surgery     CESAREAN SECTION     CHOLECYSTECTOMY  2001   COLONOSCOPY  2007   Dr. Jena Gauss: friable anal canal, few scattered left-sided diverticula, benign colonic biopsies    right ankle surgery     TONSILLECTOMY     VESICOVAGINAL  FISTULA CLOSURE W/ TAH       Home Medications:  Prior to Admission medications   Medication Sig Start Date End Date Taking? Authorizing Provider  albuterol (VENTOLIN HFA) 108 (90 Base) MCG/ACT inhaler Inhale 2 puffs into the lungs every 6 (six) hours as needed for wheezing or shortness of breath.   Yes [provider]  apixaban (ELIQUIS) 5 MG TABS tablet Take 5 mg by mouth 2 (two) times daily.   Yes [provider]  diltiazem (CARDIZEM CD) 300 MG 24 hr capsule TAKE ONE CAPSULE BY MOUTH ONCE DAILY. 01/29/20  Yes BranchDorothe Pea, MD  hydrOXYzine (ATARAX/VISTARIL) 25 MG tablet Take 25 mg by mouth every 8 (eight) hours as needed. 06/07/20  Yes [provider]  lisinopril (ZESTRIL) 20 MG tablet TAKE ONE TABLET BY MOUTH ONCE DAILY. 06/30/20  Yes BranchDorothe Pea, MD  metFORMIN (GLUCOPHAGE) 500 MG tablet Take 500 mg by mouth every morning. 10/06/20  Yes [provider]  metoprolol (TOPROL-XL) 200 MG 24 hr tablet TAKE (1) TABLET BY MOUTH ONCE DAILY. 08/28/19  Yes Branch, Dorothe Pea, MD  oxyCODONE-acetaminophen (PERCOCET) 10-325 MG per tablet Take 1 tablet by mouth every 6 (six) hours.   Yes [provider]  OXYGEN Inhale 2 L into the lungs daily.   Yes [provider]  potassium gluconate 595 (99 K) MG TABS  tablet Take 595 mg by mouth.   Yes [provider]  pravastatin (PRAVACHOL) 40 MG tablet Take 40 mg by mouth daily.   Yes [provider]  torsemide (DEMADEX) 100 MG tablet TAKE 1/2 TABLET BY MOUTH ONCE DAILY. Patient taking differently: Take 100 mg by mouth daily. TAKE 1/2 TABLET BY MOUTH ONCE DAILY. Can take 1/2 tablet every evening as needed for fluid 04/26/20  Yes Branch, Dorothe Pea, MD  doxycycline (VIBRAMYCIN) 100 MG capsule Take 1 capsule (100 mg total) by mouth 2 (two) times daily. One po bid x 7 days Patient not taking: No sig reported 08/08/20   Geoffery Lyons, MD  pantoprazole (PROTONIX) 40 MG tablet TAKE 1 TABLET BY MOUTH  30 MINUTES BEFORE BREAKFAST. Patient not taking: No sig reported 11/27/18   Anice Paganini, NP  predniSONE (DELTASONE) 20 MG tablet Take 2 tablets (40 mg total) by mouth daily. Patient not taking: No sig reported 09/14/20   Mardella Layman, MD    Inpatient Medications: Scheduled Meds:  apixaban  5 mg Oral BID   diltiazem  300 mg Oral Daily   furosemide  80 mg Intravenous BID   insulin aspart  0-5 Units Subcutaneous QHS   insulin aspart  0-9 Units Subcutaneous TID WC   lisinopril  20 mg Oral Daily   metoprolol  200 mg Oral Daily   pravastatin  40 mg Oral Daily   Continuous Infusions:  PRN Meds: acetaminophen **OR** acetaminophen, diphenhydrAMINE-zinc acetate, ipratropium-albuterol, loperamide, menthol-cetylpyridinium, ondansetron **OR** ondansetron (ZOFRAN) IV, oxyCODONE-acetaminophen **AND** oxyCODONE, polyethylene glycol  Allergies:   No Known Allergies  Social History:   Social History   Socioeconomic History   Marital status: Divorced    Spouse name: Not on file   Number of children: Not on file   Years of education: Not on file   Highest education level: Not on file  Occupational History   Not on file  Tobacco Use   Smoking status: Never   Smokeless tobacco: Never   Tobacco comments:    Never smoked  Vaping Use   Vaping Use: Never used  Substance and Sexual Activity   Alcohol use: No    Alcohol/week: 0.0 standard drinks   Drug use: No   Sexual activity: Yes    Birth control/protection: Surgical  Other Topics Concern   Not on file  Social History Narrative   Not on file   Social Determinants of Health   Financial Resource Strain: Not on file  Food Insecurity: Not on file  Transportation Needs: Not on file  Physical Activity: Not on file  Stress: Not on file  Social Connections: Not on file  Intimate Partner Violence: Not on file    Family History:   Family History  Problem Relation Age of Onset   Cancer Father        Lung    CVA Son    Colon cancer  Neg Hx      ROS:  Please see the history of present illness.  All other ROS reviewed and negative.     Physical Exam/Data:   Vitals:   10/12/20 2240 10/13/20 0442 10/13/20 0500 10/13/20 0911  BP:  (!) 100/58  103/61  Pulse:  87  97  Resp:  19    Temp:  98.4 F (36.9 C)    TempSrc:  Oral    SpO2: 98% 95%  94%  Weight:   (!) 177.1 kg   Height:        Intake/Output Summary (  Last 24 hours) at 10/13/2020 1117 Last data filed at 10/13/2020 0700 Gross per 24 hour  Intake 1360 ml  Output 900 ml  Net 460 ml   Last 3 Weights 10/13/2020 10/12/2020 10/11/2020  Weight (lbs) 390 lb 7 oz 389 lb 15.9 oz 390 lb  Weight (kg) 177.1 kg 176.9 kg 176.903 kg     Body mass index is 67.02 kg/m.  General:  Well nourished, well developed, in no acute distress, obese HEENT: normal Lymph: no adenopathy Neck: no JVD Endocrine:  No thryomegaly Vascular: No carotid bruits; FA pulses 2+ bilaterally without bruits  Cardiac:  normal S1, S2; irregularly irregular; 1/6 systolic murmur Lungs:  reduced breath sounds at bases Abd: soft, nontender, no hepatomegaly  Ext: trace edema Musculoskeletal:  No deformities, BUE and BLE strength normal and equal Skin: warm and dry  Neuro:  CNs 2-12 intact, no focal abnormalities noted Psych:  Normal affect   EKG:  The EKG was personally reviewed and demonstrates:  AFIB, rate 66 bpm, poor R progression in anterior leads Telemetry:  Telemetry was personally reviewed and demonstrates:  Atrial fibrillation, rate 60s  Relevant CV Studies: 07/14/11 Echo: LVEF 50-55%, mild LVH, mod LAE   06/2014 MPI 1. No reversible ischemia or infarction. Hypertensive response to stress. 2. Normal left ventricular wall motion. 3. Left ventricular ejection fraction 30%. However, this is likely due to gating abnormalities as echocardiogram on 06/17/14 reported normal LV systolic function, LVEF 55-60%. Function appears grossly normal on nuclear images.  06/2014 Echo: - Left ventricle:  Systolic function was normal. The estimated   ejection fraction was in the range of 55% to 60%. - Aortic valve: Mildly calcified annulus. Trileaflet; mildly   thickened leaflets. Valve area (VTI): 1.52 cm^2. - Mitral valve: Mildly calcified annulus. - Left atrium: The atrium was severely dilated. - Right ventricle: The cavity size was mildly dilated. - Right atrium: The atrium was severely dilated. - Atrial septum: There was a patent foramen ovale. - Technically adequate study.  Laboratory Data:  High Sensitivity Troponin:   Recent Labs  Lab 10/11/20 2217 10/11/20 2339  TROPONINIHS 11 12     Chemistry Recent Labs  Lab 10/11/20 1719 10/12/20 0418 10/13/20 0528  NA 140 141 142  K 4.0 3.2* 3.6  CL 101 99 100  CO2 30 27 34*  GLUCOSE 139* 127* 131*  BUN CREATININE 0.76 0.66 0.80  CALCIUM 8.3* 8.3* 8.1*  GFRNONAA >60 >60 >60  ANIONGAP No results for input(s): PROT, ALBUMIN, AST, ALT, ALKPHOS, BILITOT in the last 168 hours. Hematology Recent Labs  Lab 10/11/20 1719 10/12/20 0418  WBC 6.5 6.4  RBC 3.64* 3.44*  HGB 12.3 11.5*  HCT 38.2 35.8*  MCV 104.9* 104.1*  MCH 33.8 33.4  MCHC 32.2 32.1  RDW 14.0 14.1  PLT 250 219   BNP Recent Labs  Lab 10/11/20 1719  BNP 161.0*    DDimer No results for input(s): DDIMER in the last 168 hours.   Radiology/Studies:  DG Chest Portable 1 View  Result Date: 10/11/2020 CLINICAL DATA:  Swelling and shortness of breath. EXAM: PORTABLE CHEST 1 VIEW COMPARISON:  PA and lateral chest 09/14/2020. Single-view of the chest 08/07/2020. FINDINGS: Lungs are clear. Cardiomegaly. Aortic atherosclerosis. No pneumothorax or pleural fluid. IMPRESSION: No acute disease. Cardiomegaly. Aortic Atherosclerosis (ICD10-I70.0). Electronically Signed   By: Drusilla Kanner M.D.   On: 10/11/2020 18:56   ECHOCARDIOGRAM COMPLETE  Result Date: 10/12/2020  ECHOCARDIOGRAM REPORT   Patient Name:   LILLYTH SPONG Date of Exam: 10/12/2020  Medical Rec #:  [id number]     Height:       64.0 in Accession #:    [id number]    Weight:       390.0 lb Date of Birth:  1955-04-12     BSA:          2.598 m Patient Age:    72 years      BP:           105/52 mmHg Patient Gender: F             HR:           67 bpm. [facility] Procedure: 2D Echo, Cardiac Doppler and Color Doppler Indications:    Congestive Heart Failure I50.9  History:        Patient has prior history of Echocardiogram examinations, most                 recent 06/17/2014. CHF, Arrythmias:Atrial Fibrillation; Risk                 Factors:Hypertension. Long term current use of anticoagulant,                 Morbid Obesity, Palpitations.  Sonographer:    Mikki Harbor Referring Phys: [id number] Heloise Beecham EMOKPAE IMPRESSIONS  1. Left ventricular ejection fraction, by estimation, is 55 to 60%. The left ventricle has normal function. The left ventricle has no regional wall motion abnormalities. There is mild left ventricular hypertrophy. Left ventricular diastolic parameters are indeterminate.  2. Right ventricular systolic function is normal. The right ventricular size is mildly enlarged. There is mildly elevated pulmonary artery systolic pressure.  3. The pericardial effusion is circumferential.  4. The mitral valve is abnormal. No evidence of mitral valve regurgitation. Mild mitral stenosis.  5. The tricuspid valve is abnormal. Tricuspid valve regurgitation is mild to moderate.  6. The aortic valve has an indeterminant number of cusps. There is moderate calcification of the aortic valve. There is moderate thickening of the aortic valve. Aortic valve regurgitation is not visualized. Mild aortic valve stenosis. Aortic valve mean gradient measures 13.0 mmHg. Aortic valve peak gradient measures 25.0 mmHg. Aortic valve area, by VTI measures 1.41 cm.  7. Moderate pulmonary HTN, PASP is 41 mmHg.  8. The inferior vena cava is dilated in size with >50% respiratory variability, suggesting right  atrial pressure of 8 mmHg. FINDINGS  Left Ventricle: Left ventricular ejection fraction, by estimation, is 55 to 60%. The left ventricle has normal function. The left ventricle has no regional wall motion abnormalities. The left ventricular internal cavity size was normal in size. There is  mild left ventricular hypertrophy. Left ventricular diastolic parameters are indeterminate. Right Ventricle: The ventricular septum is flattened in systole suggesting RV pressure overload. The right ventricular size is mildly enlarged. No increase in right ventricular wall thickness. Right ventricular systolic function is normal. There is mildly elevated pulmonary artery systolic pressure. The tricuspid regurgitant velocity is 2.86 m/s, and with an assumed right atrial pressure of 8 mmHg, the estimated right ventricular systolic pressure is 40.7 mmHg. Left Atrium: Left atrial size was normal in size. Right Atrium: Right atrial size was normal in size. Pericardium: Trivial pericardial effusion is present. The pericardial effusion is circumferential. Mitral Valve: The mitral valve is abnormal. There is moderate thickening of the mitral valve leaflet(s). There is moderate calcification of  the mitral valve leaflet(s). Mild to moderate mitral annular calcification. No evidence of mitral valve regurgitation. Mild mitral valve stenosis. MV peak gradient, 13.7 mmHg. The mean mitral valve gradient is 5.0 mmHg. Tricuspid Valve: The tricuspid valve is abnormal. Tricuspid valve regurgitation is mild to moderate. No evidence of tricuspid stenosis. Aortic Valve: The aortic valve has an indeterminant number of cusps. There is moderate calcification of the aortic valve. There is moderate thickening of the aortic valve. There is moderate aortic valve annular calcification. Aortic valve regurgitation is not visualized. Mild aortic stenosis is present. Aortic valve mean gradient measures 13.0 mmHg. Aortic valve peak gradient measures 25.0 mmHg.  Aortic valve area, by VTI measures 1.41 cm. Pulmonic Valve: The pulmonic valve was not well visualized. Pulmonic valve regurgitation is not visualized. No evidence of pulmonic stenosis. Aorta: The aortic root is normal in size and structure. Pulmonary Artery: Moderate pulmonary HTN, PASP is 41 mmHg. Venous: The inferior vena cava is dilated in size with greater than 50% respiratory variability, suggesting right atrial pressure of 8 mmHg. IAS/Shunts: No atrial level shunt detected by color flow Doppler.  LEFT VENTRICLE PLAX 2D LVIDd:         5.00 cm  Diastology LVIDs:         3.29 cm  LV e' medial:    10.70 cm/s LV PW:         1.22 cm  LV E/e' medial:  14.1 LV IVS:        1.03 cm  LV e' lateral:   11.10 cm/s LVOT diam:     2.00 cm  LV E/e' lateral: 13.6 LV SV:         90 LV SV Index:   35 LVOT Area:     3.14 cm  RIGHT VENTRICLE RV Basal diam:  4.86 cm RV Mid diam:    3.84 cm RV S prime:     9.37 cm/s TAPSE (M-mode): 2.7 cm LEFT ATRIUM             Index       RIGHT ATRIUM           Index LA diam:        4.70 cm 1.81 cm/m  RA Area:     25.90 cm LA Vol (A2C):   78.4 ml 30.17 ml/m RA Volume:   84.50 ml  32.52 ml/m LA Vol (A4C):   71.5 ml 27.52 ml/m LA Biplane Vol: 75.1 ml 28.90 ml/m  AORTIC VALVE AV Area (Vmax):    1.63 cm AV Area (Vmean):   1.59 cm AV Area (VTI):     1.41 cm AV Vmax:           250.00 cm/s AV Vmean:          170.000 cm/s AV VTI:            0.640 m AV Peak Grad:      25.0 mmHg AV Mean Grad:      13.0 mmHg LVOT Vmax:         129.82 cm/s LVOT Vmean:        85.786 cm/s LVOT VTI:          0.287 m LVOT/AV VTI ratio: 0.45  AORTA Ao Root diam: 2.80 cm MITRAL VALVE                TRICUSPID VALVE MV Area (PHT): 2.62 cm     TR Peak grad:   32.7 mmHg MV Peak grad:  13.7  mmHg    TR Vmax:        286.00 cm/s MV Mean grad:  5.0 mmHg MV Vmax:       1.85 m/s     SHUNTS MV Vmean:      106.0 cm/s   Systemic VTI:  0.29 m MV Decel Time: 289 msec     Systemic Diam: 2.00 cm MV E velocity: 151.00 cm/s Dina Rich  MD Electronically signed by Dina Rich MD Signature Date/Time: 10/12/2020/4:47:44 PM    Final      Assessment and Plan:   Heart failure with preserved EF - NYHA Class III symptoms  -Strict I and Os -Daily weights -Maintain serum K>4.0 and Mg>2.0 -IV lasix in divided doses (titrated to response) - agree with 80 mg IV q 12hrs for today  2. Permanent atrial fibrillation -Continue Eliquis -Continue Cardizem CD   3. Essential Hypertension -Continue CCB, lisinopril, metoprolol   4. Chronic respiratory failure She is on chronic O2 (2 liters at home). She has not had a sleep study in the past. High suspicion for OSA given body habitus.  5. Hyperlipidemia -Continue pravastatin    For questions or updates, please contact [facility] Please consult [email or web address] for contact info under    Signed, Lonie Peak, MD  10/13/2020 11:17 AM

## 2020-10-13 NOTE — Evaluation (Signed)
Occupational Therapy Evaluation Patient Details Name: DELROSE ROHWER MRN: [id number] DOB: 12-Apr-1955 Today's Date: 10/13/2020    History of Present Illness Jasmin Martin is a 65 y.o. female with medical history significant for atrial fibrillation, diastolic CHF and hypertension, chronic respiratory failure on 2 L  Patient presented to the ED with complaints of progressive difficulty breathing with exertion and leg swelling particularly worse over the past 3 weeks.  She reports about 20 pound weight gain over the past 4 months.  She reports left-sided chest heaviness with activity.  She reports paroxysmal nocturnal dyspnea, and is hard to breathe even with the head of her hospital bed to be 30.  She states she is unable to walk from her kitchen to her bedroom without becoming severely short of breath.    She reports she reports compliance with Eliquis twice daily.  Patient is also on torsemide which she was previously taking 50 mg every other day, but over the past 3 weeks she has been taking it twice every day, without significant improvement in her symptoms.   Clinical Impression   Pt agreeable to OT/PT co-evaluation. Pt demonstrates mild shoulder weakness but likely at baseline levels for UE strength. Pt required Mod I to SPV level of assist during ambulation and stand pivot transfer. Pt was able to slide on shoes at EOB prior to transfer. Pt SpO2 levels fluctuated between 85% to 90% during transfers and functional ambulation while donning 2 L O2 via nasal cannula. Pt left in the chair with call bell and phone within reach. Pt not recommended for further acute OT services and will be discharged to care of nursing staff for the remainder of her stay.     Follow Up Recommendations  No OT follow up    Equipment Recommendations  None recommended by OT           Precautions / Restrictions Precautions Precautions: Fall Restrictions Weight Bearing Restrictions: No      Mobility Bed  Mobility Overal bed mobility: Modified Independent             General bed mobility comments: slightly increased time    Transfers Overall transfer level: Needs assistance Equipment used: Rolling walker (2 wheeled) Transfers: Sit to/from [employer] Sit to Stand: Supervision;Modified independent (Device/Increase time) Stand pivot transfers: Supervision;Modified independent (Device/Increase time)       General transfer comment: increased time, slightly labored movement    Balance Overall balance assessment: Needs assistance Sitting-balance support: Feet supported;No upper extremity supported Sitting balance-Leahy Scale: Good Sitting balance - Comments: seated at EOB   Standing balance support: During functional activity;Bilateral upper extremity supported Standing balance-Leahy Scale: Fair Standing balance comment: fair/good using RW                           ADL either performed or assessed with clinical judgement   ADL Overall ADL's : Needs assistance/impaired                     Lower Body Dressing: Modified independent;Sitting/lateral leans Lower Body Dressing Details (indicate cue type and reason): sliding on shoes at EOB Toilet Transfer: Modified Independent;Supervision/safety;RW;Stand-pivot Toilet Transfer Details (indicate cue type and reason): simulated via EOB to chair transfer                 Vision Baseline Vision/History: Wears glasses Wears Glasses: At all times Patient Visual Report: No change from baseline  Pertinent Vitals/Pain Pain Assessment: 0-10 Pain Score: 7  Pain Location: chronic knees, feet and low back pain Pain Descriptors / Indicators: Aching;Discomfort Pain Intervention(s): Limited activity within patient's tolerance;Monitored during session;Repositioned     Hand Dominance Right   Extremity/Trunk Assessment Upper Extremity Assessment Upper Extremity Assessment:  Overall WFL for tasks assessed (Mild shoulder weakness at 4 to 4+/5 MMT.)   Lower Extremity Assessment Lower Extremity Assessment: Defer to PT evaluation   Cervical / Trunk Assessment Cervical / Trunk Assessment: Normal   Communication Communication Communication: No difficulties   Cognition Arousal/Alertness: Awake/alert Behavior During Therapy: WFL for tasks assessed/performed Overall Cognitive Status: Within Functional Limits for tasks assessed                                                      Home Living Family/patient expects to be discharged to:: Private residence Living Arrangements: Alone Available Help at Discharge: Family;Available PRN/intermittently Type of Home: Mobile home Home Access: Ramped entrance     Home Layout: One level     Bathroom Shower/Tub: Producer, television/film/video: Standard Bathroom Accessibility: Yes   Home Equipment: Environmental consultant - 4 wheels;Cane - single point;Cane - quad;Shower seat - built in;Grab bars - tub/shower;Grab bars - toilet          Prior Functioning/Environment Level of Independence: Needs assistance  Gait / Transfers Assistance Needed: household ambulator using Rollator ADL's / Homemaking Assistance Needed: Pt able dress herself other than assist for socks. Pt reports independence with bathing and cooking with family assisting with cleaning.                          OT Goals(Current goals can be found in the care plan section) Acute Rehab OT Goals Patient Stated Goal: return home with family to assist                   Co-evaluation PT/OT/SLP Co-Evaluation/Treatment: Yes Reason for Co-Treatment: To address functional/ADL transfers   OT goals addressed during session: ADL's and self-care;Strengthening/ROM                       End of Session Equipment Utilized During Treatment: Rolling walker;Oxygen  Activity Tolerance: Patient tolerated treatment well Patient left: in  chair;with call bell/phone within reach  OT Visit Diagnosis: Unsteadiness on feet (R26.81);Other (comment) (difficulty breathing)                Time: 9983-3825 OT Time Calculation (min): 15 min Charges:  OT General Charges $OT Visit: 1 Visit OT Evaluation $OT Eval Low Complexity: 1 Low  Livia Tarr OT, MOT  Danie Chandler 10/13/2020, 9:57 AM

## 2020-10-13 NOTE — Plan of Care (Signed)
  Problem: Acute Rehab PT Goals(only PT should resolve) Goal: Pt Will Go Supine/Side To Sit Outcome: Progressing Flowsheets (Taken 10/13/2020 0923) Pt will go Supine/Side to Sit: Independently Goal: Patient Will Transfer Sit To/From Stand Outcome: Progressing Flowsheets (Taken 10/13/2020 0923) Patient will transfer sit to/from stand: with modified independence Goal: Pt Will Transfer Bed To Chair/Chair To Bed Outcome: Progressing Flowsheets (Taken 10/13/2020 0923) Pt will Transfer Bed to Chair/Chair to Bed: with modified independence Goal: Pt Will Ambulate Outcome: Progressing Flowsheets (Taken 10/13/2020 0923) Pt will Ambulate:  100 feet  with modified independence  with rolling walker   9:25 AM, 10/13/20 Ocie Bob, MPT Physical Therapist with [facility] [phone] office [phone] mobile phone

## 2020-10-13 NOTE — Evaluation (Signed)
Physical Therapy Evaluation Patient Details Name: Jasmin Martin MRN: [id number] DOB: Jul 30, 1955 Today's Date: 10/13/2020   History of Present Illness  Jasmin Martin is a 65 y.o. female with medical history significant for atrial fibrillation, diastolic CHF and hypertension, chronic respiratory failure on 2 L  Patient presented to the ED with complaints of progressive difficulty breathing with exertion and leg swelling particularly worse over the past 3 weeks.  She reports about 20 pound weight gain over the past 4 months.  She reports left-sided chest heaviness with activity.  She reports paroxysmal nocturnal dyspnea, and is hard to breathe even with the head of her hospital bed to be 30.  She states she is unable to walk from her kitchen to her bedroom without becoming severely short of breath.    She reports she reports compliance with Eliquis twice daily.  Patient is also on torsemide which she was previously taking 50 mg every other day, but over the past 3 weeks she has been taking it twice every day, without significant improvement in her symptoms.   Clinical Impression  Patient functioning near baseline for functional mobility and gait demonstrating good return for ambulating in room and hallway without loss of balance, limited mostly due to fatigue and SOB with SpO2 dropping from 90% to 86% while on 2 LPM O2.  Patient tolerated sitting up in chair after therapy - RN aware.  Patient will benefit from continued physical therapy in hospital and recommended venue below to increase strength, balance, endurance for safe ADLs and gait.       Follow Up Recommendations No PT follow up    Equipment Recommendations  None recommended by PT    Recommendations for Other Services       Precautions / Restrictions Precautions Precautions: Fall Restrictions Weight Bearing Restrictions: No      Mobility  Bed Mobility Overal bed mobility: Modified Independent             General bed  mobility comments: slightly increased time    Transfers Overall transfer level: Needs assistance Equipment used: Rolling walker (2 wheeled) Transfers: Sit to/from [employer] Sit to Stand: Supervision;Modified independent (Device/Increase time) Stand pivot transfers: Supervision;Modified independent (Device/Increase time)       General transfer comment: increased time, slightly labored movement  Ambulation/Gait Ambulation/Gait assistance: Supervision;Modified independent (Device/Increase time) Gait Distance (Feet): 75 Feet Assistive device: Rolling walker (2 wheeled) Gait Pattern/deviations: Decreased step length - right;Decreased step length - left;Decreased stride length Gait velocity: decreased   General Gait Details: slightly labored cadence without loss of balance, required increased time to make turns, on 2 LPM with SpO2 dropping from 90% to 86%  Stairs            Wheelchair Mobility    Modified Rankin (Stroke Patients Only)       Balance Overall balance assessment: Needs assistance Sitting-balance support: Feet supported;No upper extremity supported Sitting balance-Leahy Scale: Good Sitting balance - Comments: seated at EOB   Standing balance support: During functional activity;Bilateral upper extremity supported Standing balance-Leahy Scale: Fair Standing balance comment: fair/good using RW                             Pertinent Vitals/Pain Pain Assessment: 0-10 Pain Score: 7  Pain Location: chronic knees, feet and low back pain Pain Descriptors / Indicators: Aching;Discomfort Pain Intervention(s): Limited activity within patient's tolerance;Monitored during session    Home Living Family/patient expects to  be discharged to:: Private residence Living Arrangements: Alone Available Help at Discharge: Family;Available PRN/intermittently Type of Home: Mobile home Home Access: Ramped entrance     Home Layout: One level Home  Equipment: Walker - 4 wheels;Cane - single point;Cane - quad;Shower seat - built in;Grab bars - tub/shower;Grab bars - toilet      Prior Function Level of Independence: Needs assistance   Gait / Transfers Assistance Needed: household ambulator using Rollator  ADL's / Homemaking Assistance Needed: assisted by family for community ADL's        Hand Dominance        Extremity/Trunk Assessment   Upper Extremity Assessment Upper Extremity Assessment: Defer to OT evaluation    Lower Extremity Assessment Lower Extremity Assessment: Generalized weakness    Cervical / Trunk Assessment Cervical / Trunk Assessment: Normal  Communication   Communication: No difficulties  Cognition Arousal/Alertness: Awake/alert Behavior During Therapy: WFL for tasks assessed/performed Overall Cognitive Status: Within Functional Limits for tasks assessed                                        General Comments      Exercises     Assessment/Plan    PT Assessment Patient needs continued PT services  PT Problem List Decreased strength;Decreased activity tolerance;Decreased balance;Decreased mobility       PT Treatment Interventions DME instruction;Gait training;Stair training;Functional mobility training;Therapeutic activities;Therapeutic exercise;Patient/family education;Balance training    PT Goals (Current goals can be found in the Care Plan section)  Acute Rehab PT Goals Patient Stated Goal: return home with family to assist PT Goal Formulation: With patient Time For Goal Achievement: 10/20/20 Potential to Achieve Goals: Good    Frequency Min 2X/week   Barriers to discharge        Co-evaluation               AM-PAC PT "6 Clicks" Mobility  Outcome Measure Help needed turning from your back to your side while in a flat bed without using bedrails?: None Help needed moving from lying on your back to sitting on the side of a flat bed without using bedrails?:  None Help needed moving to and from a bed to a chair (including a wheelchair)?: None Help needed standing up from a chair using your arms (e.g., wheelchair or bedside chair)?: None Help needed to walk in hospital room?: A Little Help needed climbing 3-5 steps with a railing? : A Little 6 Click Score: 22    End of Session Equipment Utilized During Treatment: Oxygen Activity Tolerance: Patient tolerated treatment well;Patient limited by fatigue Patient left: in chair;with call bell/phone within reach Nurse Communication: Mobility status PT Visit Diagnosis: Unsteadiness on feet (R26.81);Other abnormalities of gait and mobility (R26.89);Muscle weakness (generalized) (M62.81)    Time: 8676-1950 PT Time Calculation (min) (ACUTE ONLY): 20 min   Charges:   PT Evaluation $PT Eval Moderate Complexity: 1 Mod PT Treatments $Therapeutic Activity: 8-22 mins        9:19 AM, 10/13/20 Ocie Bob, MPT Physical Therapist with [facility] [phone] office [phone] mobile phone

## 2020-10-14 LAB — GLUCOSE, CAPILLARY
Glucose-Capillary: 120 mg/dL — ABNORMAL HIGH (ref 70–99)
Glucose-Capillary: 152 mg/dL — ABNORMAL HIGH (ref 70–99)
Glucose-Capillary: 119 mg/dL — ABNORMAL HIGH (ref 70–99)

## 2020-10-14 LAB — BASIC METABOLIC PANEL
Anion gap: 9 (ref 5–15)
BUN: 13 mg/dL (ref 8–23)
CO2: 34 mmol/L — ABNORMAL HIGH (ref 22–32)
Calcium: 8 mg/dL — ABNORMAL LOW (ref 8.9–10.3)
Chloride: 97 mmol/L — ABNORMAL LOW (ref 98–111)
Creatinine, Ser: 0.75 mg/dL (ref 0.44–1.00)
GFR, Estimated: >60 mL/min (ref >60)
Glucose, Bld: 122 mg/dL — ABNORMAL HIGH (ref 70–99)
Potassium: 3.7 mmol/L (ref 3.5–5.1)
Sodium: 140 mmol/L (ref 135–145)

## 2020-10-14 NOTE — Progress Notes (Signed)
Physical Therapy Treatment Patient Details Name: Jasmin Martin MRN: [id number] DOB: 26-Mar-1956 Today's Date: 10/14/2020    History of Present Illness Jasmin Martin is a 65 y.o. female with medical history significant for atrial fibrillation, diastolic CHF and hypertension, chronic respiratory failure on 2 L  Patient presented to the ED with complaints of progressive difficulty breathing with exertion and leg swelling particularly worse over the past 3 weeks.  She reports about 20 pound weight gain over the past 4 months.  She reports left-sided chest heaviness with activity.  She reports paroxysmal nocturnal dyspnea, and is hard to breathe even with the head of her hospital bed to be 30.  She states she is unable to walk from her kitchen to her bedroom without becoming severely short of breath.    She reports she reports compliance with Eliquis twice daily.  Patient is also on torsemide which she was previously taking 50 mg every other day, but over the past 3 weeks she has been taking it twice every day, without significant improvement in her symptoms.    PT Comments    Patient stating dizziness today but is agreeable to participate and complete exercises seated EOB. She transitions to EOB without assist. Patient demonstrates good sitting balance and sitting tolerance EOB. She completes seated exercises with good mechanics. Patient seated EOB at end of session. Patient will benefit from continued physical therapy in hospital and recommended venue below to increase strength, balance, endurance for safe ADLs and gait.    Follow Up Recommendations  No PT follow up     Equipment Recommendations  None recommended by PT    Recommendations for Other Services       Precautions / Restrictions Precautions Precautions: Fall Restrictions Weight Bearing Restrictions: No    Mobility  Bed Mobility Overal bed mobility: Modified Independent             General bed mobility comments: slightly  increased time    Transfers                    Ambulation/Gait                 Stairs             Wheelchair Mobility    Modified Rankin (Stroke Patients Only)       Balance Overall balance assessment: Needs assistance Sitting-balance support: Feet supported;No upper extremity supported Sitting balance-Leahy Scale: Good Sitting balance - Comments: seated at EOB                                    Cognition Arousal/Alertness: Awake/alert Behavior During Therapy: WFL for tasks assessed/performed Overall Cognitive Status: Within Functional Limits for tasks assessed                                        Exercises General Exercises - Upper Extremity Shoulder Flexion: AROM;Both;10 reps;Seated Shoulder ABduction: AROM;Both;10 reps;Seated Elbow Flexion: AROM;Both;10 reps;Seated Elbow Extension: AROM;Both;10 reps;Seated General Exercises - Lower Extremity Long Arc Quad: AROM;Both;20 reps;Seated Hip Flexion/Marching: AROM;Both;20 reps;Seated Toe Raises: AROM;Both;20 reps;Seated Heel Raises: AROM;Both;20 reps;Seated Other Exercises Other Exercises: shoulder circles 10x forward, 10x back    General Comments        Pertinent Vitals/Pain Pain Assessment: No/denies pain    Home Living  Prior Function            PT Goals (current goals can now be found in the care plan section) Acute Rehab PT Goals Patient Stated Goal: return home with family to assist PT Goal Formulation: With patient Time For Goal Achievement: 10/20/20 Potential to Achieve Goals: Good Progress towards PT goals: Progressing toward goals    Frequency    Min 2X/week      PT Plan Current plan remains appropriate    Co-evaluation              AM-PAC PT "6 Clicks" Mobility   Outcome Measure  Help needed turning from your back to your side while in a flat bed without using bedrails?: None Help needed  moving from lying on your back to sitting on the side of a flat bed without using bedrails?: None Help needed moving to and from a bed to a chair (including a wheelchair)?: None Help needed standing up from a chair using your arms (e.g., wheelchair or bedside chair)?: None Help needed to walk in hospital room?: A Little Help needed climbing 3-5 steps with a railing? : A Little 6 Click Score: 22    End of Session Equipment Utilized During Treatment: Oxygen Activity Tolerance: Patient tolerated treatment well;Patient limited by fatigue (limited by c/o dizziness) Patient left: with call bell/phone within reach;in bed Nurse Communication: Mobility status PT Visit Diagnosis: Unsteadiness on feet (R26.81);Other abnormalities of gait and mobility (R26.89);Muscle weakness (generalized) (M62.81)     Time: 9924-2683 PT Time Calculation (min) (ACUTE ONLY): 11 min  Charges:  $Therapeutic Exercise: 8-22 mins                     11:40 AM, 10/14/20 Wyman Songster PT, DPT Physical Therapist at [facility]

## 2020-10-14 NOTE — Care Management Important Message (Signed)
Important Message  Patient Details  Name: Jasmin Martin MRN: [id number] Date of Birth: [birth year]   Medicare Important Message Given:  Yes     Corey Harold 10/14/2020, 2:36 PM

## 2020-10-14 NOTE — Progress Notes (Signed)
Progress Note  Patient Name: Jasmin SlateRuth J Martin Date of Encounter: 10/14/2020  [facility] Cardiologist: Dina RichBranch, Domenick Quebedeaux, MD   Subjective   Ongoing SOB  Inpatient Medications    Scheduled Meds:  apixaban  5 mg Oral BID   diltiazem  300 mg Oral Daily   furosemide  80 mg Intravenous BID   insulin aspart  0-5 Units Subcutaneous QHS   insulin aspart  0-9 Units Subcutaneous TID WC   lisinopril  20 mg Oral Daily   metoprolol  200 mg Oral Daily   pravastatin  40 mg Oral Daily   Continuous Infusions:  PRN Meds: acetaminophen **OR** acetaminophen, diphenhydrAMINE-zinc acetate, ipratropium-albuterol, loperamide, menthol-cetylpyridinium, ondansetron **OR** ondansetron (ZOFRAN) IV, oxyCODONE-acetaminophen **AND** oxyCODONE, polyethylene glycol   Vital Signs    Vitals:   10/13/20 1340 10/13/20 2149 10/14/20 0414 10/14/20 0500  BP: (!) 98/58 (!) 114/59 (!) 94/56   Pulse: 74 67 71   Resp:  18 19   Temp: 97.8 F (36.6 C) 98.1 F (36.7 C) 98 F (36.7 C)   TempSrc: Oral Oral    SpO2: 99% 99% 92%   Weight:    (!) 177.2 kg  Height:        Intake/Output Summary (Last 24 hours) at 10/14/2020 0820 Last data filed at 10/14/2020 0500 Gross per 24 hour  Intake 1060 ml  Output 1200 ml  Net -140 ml   Last 3 Weights 10/14/2020 10/13/2020 10/12/2020  Weight (lbs) 390 lb 10.5 oz 390 lb 7 oz 389 lb 15.9 oz  Weight (kg) 177.2 kg 177.1 kg 176.9 kg      Telemetry    Rate controlled afib- Personally Reviewed  ECG    N/a - Personally Reviewed  Physical Exam   GEN: No acute distress.   Neck: elevated JVD Cardiac: irreg, 2/6 systolic murmur rusb Respiratory: Clear to auscultation bilaterally. GI: Soft, nontender, non-distended  MS: No edema; No deformity. Neuro:  Nonfocal  Psych: Normal affect   Labs    High Sensitivity Troponin:   Recent Labs  Lab 10/11/20 2217 10/11/20 2339  TROPONINIHS 11 12      Chemistry Recent Labs  Lab 10/12/20 0418 10/13/20 0528 10/14/20 0548   NA 141 142 140  K 3.2* 3.6 3.7  CL 99 100 97*  CO2 27 34* 34*  GLUCOSE 127* 131* 122*  BUN 11 14 13   CREATININE 0.66 0.80 0.75  CALCIUM 8.3* 8.1* 8.0*  GFRNONAA >60 >60 >60  ANIONGAP 15 8 9      Hematology Recent Labs  Lab 10/11/20 1719 10/12/20 0418  WBC 6.5 6.4  RBC 3.64* 3.44*  HGB 12.3 11.5*  HCT 38.2 35.8*  MCV 104.9* 104.1*  MCH 33.8 33.4  MCHC 32.2 32.1  RDW 14.0 14.1  PLT 250 219    BNP Recent Labs  Lab 10/11/20 1719  BNP 161.0*     DDimer No results for input(s): DDIMER in the last 168 hours.   Radiology    ECHOCARDIOGRAM COMPLETE  Result Date: 10/12/2020    ECHOCARDIOGRAM REPORT   Patient Name:   Jasmin SlateRUTH J Martin Date of Exam: 10/12/2020 Medical Rec #:  [id number]     Height:       64.0 in Accession #:    [id number]    Weight:       390.0 lb Date of Birth:  [birth year]     BSA:          2.598 m Patient Age:    65 years  BP:           105/52 mmHg Patient Gender: F             HR:           67 bpm. [facility] Procedure: 2D Echo, Cardiac Doppler and Color Doppler Indications:    Congestive Heart Failure I50.9  History:        Patient has prior history of Echocardiogram examinations, most                 recent 06/17/2014. CHF, Arrythmias:Atrial Fibrillation; Risk                 Factors:Hypertension. Long term current use of anticoagulant,                 Morbid Obesity, Palpitations.  Sonographer:    Mikki Harbor Referring Phys: [phone] Heloise Beecham EMOKPAE IMPRESSIONS  1. Left ventricular ejection fraction, by estimation, is 55 to 60%. The left ventricle has normal function. The left ventricle has no regional wall motion abnormalities. There is mild left ventricular hypertrophy. Left ventricular diastolic parameters are indeterminate.  2. Right ventricular systolic function is normal. The right ventricular size is mildly enlarged. There is mildly elevated pulmonary artery systolic pressure.  3. The pericardial effusion is circumferential.  4. The  mitral valve is abnormal. No evidence of mitral valve regurgitation. Mild mitral stenosis.  5. The tricuspid valve is abnormal. Tricuspid valve regurgitation is mild to moderate.  6. The aortic valve has an indeterminant number of cusps. There is moderate calcification of the aortic valve. There is moderate thickening of the aortic valve. Aortic valve regurgitation is not visualized. Mild aortic valve stenosis. Aortic valve mean gradient measures 13.0 mmHg. Aortic valve peak gradient measures 25.0 mmHg. Aortic valve area, by VTI measures 1.41 cm.  7. Moderate pulmonary HTN, PASP is 41 mmHg.  8. The inferior vena cava is dilated in size with >50% respiratory variability, suggesting right atrial pressure of 8 mmHg. FINDINGS  Left Ventricle: Left ventricular ejection fraction, by estimation, is 55 to 60%. The left ventricle has normal function. The left ventricle has no regional wall motion abnormalities. The left ventricular internal cavity size was normal in size. There is  mild left ventricular hypertrophy. Left ventricular diastolic parameters are indeterminate. Right Ventricle: The ventricular septum is flattened in systole suggesting RV pressure overload. The right ventricular size is mildly enlarged. No increase in right ventricular wall thickness. Right ventricular systolic function is normal. There is mildly elevated pulmonary artery systolic pressure. The tricuspid regurgitant velocity is 2.86 m/s, and with an assumed right atrial pressure of 8 mmHg, the estimated right ventricular systolic pressure is 40.7 mmHg. Left Atrium: Left atrial size was normal in size. Right Atrium: Right atrial size was normal in size. Pericardium: Trivial pericardial effusion is present. The pericardial effusion is circumferential. Mitral Valve: The mitral valve is abnormal. There is moderate thickening of the mitral valve leaflet(s). There is moderate calcification of the mitral valve leaflet(s). Mild to moderate mitral annular  calcification. No evidence of mitral valve regurgitation. Mild mitral valve stenosis. MV peak gradient, 13.7 mmHg. The mean mitral valve gradient is 5.0 mmHg. Tricuspid Valve: The tricuspid valve is abnormal. Tricuspid valve regurgitation is mild to moderate. No evidence of tricuspid stenosis. Aortic Valve: The aortic valve has an indeterminant number of cusps. There is moderate calcification of the aortic valve. There is moderate thickening of the aortic valve. There is moderate aortic valve  annular calcification. Aortic valve regurgitation is not visualized. Mild aortic stenosis is present. Aortic valve mean gradient measures 13.0 mmHg. Aortic valve peak gradient measures 25.0 mmHg. Aortic valve area, by VTI measures 1.41 cm. Pulmonic Valve: The pulmonic valve was not well visualized. Pulmonic valve regurgitation is not visualized. No evidence of pulmonic stenosis. Aorta: The aortic root is normal in size and structure. Pulmonary Artery: Moderate pulmonary HTN, PASP is 41 mmHg. Venous: The inferior vena cava is dilated in size with greater than 50% respiratory variability, suggesting right atrial pressure of 8 mmHg. IAS/Shunts: No atrial level shunt detected by color flow Doppler.  LEFT VENTRICLE PLAX 2D LVIDd:         5.00 cm  Diastology LVIDs:         3.29 cm  LV e' medial:    10.70 cm/s LV PW:         1.22 cm  LV E/e' medial:  14.1 LV IVS:        1.03 cm  LV e' lateral:   11.10 cm/s LVOT diam:     2.00 cm  LV E/e' lateral: 13.6 LV SV:         90 LV SV Index:   35 LVOT Area:     3.14 cm  RIGHT VENTRICLE RV Basal diam:  4.86 cm RV Mid diam:    3.84 cm RV S prime:     9.37 cm/s TAPSE (M-mode): 2.7 cm LEFT ATRIUM             Index       RIGHT ATRIUM           Index LA diam:        4.70 cm 1.81 cm/m  RA Area:     25.90 cm LA Vol (A2C):   78.4 ml 30.17 ml/m RA Volume:   84.50 ml  32.52 ml/m LA Vol (A4C):   71.5 ml 27.52 ml/m LA Biplane Vol: 75.1 ml 28.90 ml/m  AORTIC VALVE AV Area (Vmax):    1.63 cm AV Area  (Vmean):   1.59 cm AV Area (VTI):     1.41 cm AV Vmax:           250.00 cm/s AV Vmean:          170.000 cm/s AV VTI:            0.640 m AV Peak Grad:      25.0 mmHg AV Mean Grad:      13.0 mmHg LVOT Vmax:         129.82 cm/s LVOT Vmean:        85.786 cm/s LVOT VTI:          0.287 m LVOT/AV VTI ratio: 0.45  AORTA Ao Root diam: 2.80 cm MITRAL VALVE                TRICUSPID VALVE MV Area (PHT): 2.62 cm     TR Peak grad:   32.7 mmHg MV Peak grad:  13.7 mmHg    TR Vmax:        286.00 cm/s MV Mean grad:  5.0 mmHg MV Vmax:       1.85 m/s     SHUNTS MV Vmean:      106.0 cm/s   Systemic VTI:  0.29 m MV Decel Time: 289 msec     Systemic Diam: 2.00 cm MV E velocity: 151.00 cm/s Dina Rich MD Electronically signed by Dina Rich MD Signature Date/Time: 10/12/2020/4:47:44 PM  Final     Cardiac Studies    Patient Profile     Jasmin Martin is a 65 y.o. female with a hx of HFpEF, permanent atrial fibrillation, chronic O2 dependent respiratory failure, hypertension, and morbid obeisty who is being seen 10/13/2020 for the evaluation of worsening shortness of breath at the request of Tyrone Nine, MD.  Assessment & Plan    Acute on chronic diastolic HF -presented with SOB, LE edema - 09/2020 LVE 55-60%, indet diastolic function, normal RV - BNP 161, CXR no acute process. Reported 20 lbs weight gain over 4 months - wt 177 kg on admission(389 lbs), was 372 lbs in 05/2020  - started on IV lasix 80mg  bid,   -incomplete I/Os, stable weights. Stable renal function with diuresis, has only received IV lasix 60mg  and 80mg  so far.  - continue IV diuresis  2. Mild aortic stenosis - Aortic valve mean gradient measures 13.0 mmHg. Aortic valve peak gradient measures 25.0  mmHg. Aortic valve area, by VTI measures 1.41 cm.   3. Permanent afib - continue rate control anticoagulation   4. Chronic respiratory failure - on 2L [place] at home  5. Low blood pressure - hold lisinopril for now  For questions or  updates, please contact [facility] Please consult [email or web address] for contact info under        Signed, , MD  10/14/2020, 8:20 AM

## 2020-10-14 NOTE — Progress Notes (Signed)
PROGRESS NOTE  Jasmin Martin  GYI:[id number] DOB: [birth year] DOA: 10/11/2020 PCP: Ponciano Ort [facility]   Brief Narrative: Jasmin Martin is a 65 y.o. female with a history of HFpEF, atrial fibrillation, chronic 2L O2-dependent respiratory failure, HTN, and morbid obesity who presented to the ED 7/12 with progressive exertional dyspnea and leg swelling associated with 20 lbs weight gain despite augmenting torsemide dosing for 3 weeks. Respiratory status was stable on home 2L O2, but appeared grossly volume overloaded. IV diuresis was initiated and echocardiogram revealed LVEF 55-60% without WMA, mild LVH w/indeterminate diastolic parameters. RV mildly enlarged with pulmonary HTN (PASP ), normal systolic function. Circumferential pericardial effusion said to be trivial. Mild AS, mild-mod TR, calcified AV with mild AS. IVC was dilated and blunted. Cardiology is consulted for assistance with diuresis.   Assessment & Plan: Principal Problem:   Acute on chronic diastolic CHF (congestive heart failure) (HCC) Active Problems:   Morbid obesity (HCC)   Hypertension   FIBRILLATION, ATRIAL   Long term current use of anticoagulant  Acute on chronic HFpEF: ?tachycardia mediated, though not in RVR currently.  - Continue IV lasix. Will increase dose to 80mg  IV BID and ask for further recommendations from cardiology.  - Net positive due to >2L intake yesterday. Initiate fluids restriction. Wt unchanged.  - Strict I/O, monitor BMP and electrolytes, continue telemetry.  - Continue daily weights.   - Pt remains volume overloaded, continue IV lasix as ordered, appreciate cardiology recommendations.   Chronic paroxysmal atrial fibrillation:  - Continue metoprolol succinate 200mg  daily, diltiazem 300mg  daily - Continue eliquis  HTN: Continue BB, CCB, and lisinopril as above.   T2DM: Well-controlled with HbA1c 7.1%.  - Hold metformin, continue SSI  Chronic hypoxic respiratory failure:  - Continue  2L O2. Was increased from 1L a couple months ago. May attempt to wean to 1L toward end of hospitalization.   HLD:  - Continue statin  GERD:  - Continue PPI   Morbid obesity: Estimated body mass index is 67.06 kg/m as calculated from the following:   Height as of this encounter: 5\' 4"  (1.626 m).   Weight as of this encounter: 177.2 kg.  DVT prophylaxis: Eliquis Code Status: Full Family Communication: None at bedside Disposition Plan:  Status is: Inpatient  Remains inpatient appropriate because:Inpatient level of care appropriate due to severity of illness  Dispo: The patient is from: Home              Anticipated d/c is to: Home              Patient currently is not medically stable to d/c.     Difficult to place patient No  Consultants:  None  Procedures:  Echocardiogram 10/12/2020: LVEF 55-60% without WMA, mild LVH w/indeterminate diastolic parameters. RV mildly enlarged with pulmonary HTN (PASP ), normal systolic function. Circumferential pericardial effusion said to be trivial. Mild AS, mild-mod TR, calcified AV with mild AS. IVC was dilated and blunted. Cardiology is consulted for assistance with diuresis.   Antimicrobials: None   Subjective: Pt reports she is urinating frequently on the IV lasix.     Objective: Vitals:   10/14/20 0500 10/14/20 0800 10/14/20 1021 10/14/20 1248  BP:  (!) 109/44 (!) 121/54 103/88  Pulse:  66 81 83  Resp:    18  Temp:    98.3 F (36.8 C)  TempSrc:    Oral  SpO2:  96% 97% 96%  Weight: (!) 177.2 kg  Height:        Intake/Output Summary (Last 24 hours) at 10/14/2020 1657 Last data filed at 10/14/2020 1250 Gross per 24 hour  Intake 830 ml  Output 1200 ml  Net -370 ml   Filed Weights   10/12/20 0500 10/13/20 0500 10/14/20 0500  Weight: (!) 176.9 kg (!) 177.1 kg (!) 177.2 kg   Gen: 65 y.o. female in no distress Pulm: Nonlabored breathing supplemental oxygen, crackles bilaterally are decreased.  CV: Irreg irreg, II/VI  systolic murmur at LSB without, rub, or gallop. UTD JVD. GI: Abdomen soft, non-tender, non-distended, with normoactive bowel sounds.  Ext: Warm, no deformities. Obese but with pitting edema ~2+ symmetrically. Skin: No new rashes, lesions or ulcers on visualized skin. Neuro: Alert and oriented. No focal neurological deficits. Psych: Judgement and insight appear fair. Mood euthymic & affect congruent. Behavior is appropriate.    Data Reviewed: I have personally reviewed following labs and imaging studies  CBC: Recent Labs  Lab 10/11/20 1719 10/12/20 0418  WBC 6.5 6.4  HGB 12.3 11.5*  HCT 38.2 35.8*  MCV 104.9* 104.1*  PLT 250 219   Basic Metabolic Panel: Recent Labs  Lab 10/11/20 1719 10/12/20 0418 10/13/20 0528 10/14/20 0548  NA 140 141 142 140  K 4.0 3.2* 3.6 3.7  CL 101 99 100 97*  CO2 30 27 34* 34*  GLUCOSE 139* 127* 131* 122*  BUN 12 11 14 13   CREATININE 0.76 0.66 0.80 0.75  CALCIUM 8.3* 8.3* 8.1* 8.0*  MG 1.5* 1.8  --   --    GFR: Estimated Creatinine Clearance: 111.7 mL/min (by C-G formula based on SCr of 0.75 mg/dL). Liver Function Tests: No results for input(s): AST, ALT, ALKPHOS, BILITOT, PROT, ALBUMIN in the last 168 hours. No results for input(s): LIPASE, AMYLASE in the last 168 hours. No results for input(s): AMMONIA in the last 168 hours. Coagulation Profile: No results for input(s): INR, PROTIME in the last 168 hours. Cardiac Enzymes: No results for input(s): CKTOTAL, CKMB, CKMBINDEX, TROPONINI in the last 168 hours. BNP (last 3 results) No results for input(s): PROBNP in the last 8760 hours. HbA1C: Recent Labs    10/11/20 2217  HGBA1C 7.1*   CBG: Recent Labs  Lab 10/13/20 1104 10/13/20 1616 10/13/20 2159 10/14/20 0739 10/14/20 1623  GLUCAP 151* 118* 119* 120* 152*   Lipid Profile: No results for input(s): CHOL, HDL, LDLCALC, TRIG, CHOLHDL, LDLDIRECT in the last 72 hours. Thyroid Function Tests: No results for input(s): TSH, T4TOTAL,  FREET4, T3FREE, THYROIDAB in the last 72 hours. Anemia Panel: No results for input(s): VITAMINB12, FOLATE, FERRITIN, TIBC, IRON, RETICCTPCT in the last 72 hours. Urine analysis:    Component Value Date/Time   COLORURINE AMBER BIOCHEMICALS MAY BE AFFECTED BY COLOR (A) 05/23/2009 2020   APPEARANCEUR CLEAR 05/23/2009 2020   LABSPEC >1.030 (H) 05/23/2009 2020   PHURINE 5.5 05/23/2009 2020   GLUCOSEU 100 (A) 05/23/2009 2020   HGBUR MODERATE (A) 05/23/2009 2020   BILIRUBINUR MODERATE (A) 05/23/2009 2020   KETONESUR NEGATIVE 05/23/2009 2020   PROTEINUR >300 (A) 05/23/2009 2020   UROBILINOGEN 0.2 05/23/2009 2020   NITRITE POSITIVE (A) 05/23/2009 2020   LEUKOCYTESUR NEGATIVE 05/23/2009 2020   Recent Results (from the past 240 hour(s))  SARS CORONAVIRUS 2 (TAT 6-24 HRS) Nasopharyngeal Nasopharyngeal Swab     Status: None   Collection Time: 10/11/20  5:20 PM   Specimen: Nasopharyngeal Swab  Result Value Ref Range Status   SARS Coronavirus 2 NEGATIVE NEGATIVE Final  Comment: (NOTE) SARS-CoV-2 target nucleic acids are NOT DETECTED.  The SARS-CoV-2 RNA is generally detectable in upper and lower respiratory specimens during the acute phase of infection. Negative results do not preclude SARS-CoV-2 infection, do not rule out co-infections with other pathogens, and should not be used as the sole basis for treatment or other patient management decisions. Negative results must be combined with clinical observations, patient history, and epidemiological information. The expected result is Negative.  Fact Sheet for Patients: HairSlick.no  Fact Sheet for Healthcare Providers: quierodirigir.com  This test is not yet approved or cleared by the [place] FDA and  has been authorized for detection and/or diagnosis of SARS-CoV-2 by FDA under an Emergency Use Authorization (EUA). This EUA will remain  in effect (meaning this test can be  used) for the duration of the COVID-19 declaration under Se ction 564(b)(1) of the Act, 21 U.S.C. section 360bbb-3(b)(1), unless the authorization is terminated or revoked sooner.  Performed at [facility], [address] [place], [place] [address]      Radiology Studies: No results found.  Scheduled Meds:  apixaban  5 mg Oral BID   diltiazem  300 mg Oral Daily   furosemide  80 mg Intravenous BID   insulin aspart  0-5 Units Subcutaneous QHS   insulin aspart  0-9 Units Subcutaneous TID WC   metoprolol  200 mg Oral Daily   pravastatin  40 mg Oral Daily   Continuous Infusions:   LOS: 3 days   Time spent: 35 minutes.  Standley Dakins, MD [facility] [email or web address] 10/14/2020, 4:57 PM

## 2020-10-15 DIAGNOSIS — I482 Chronic atrial fibrillation, unspecified: Secondary | ICD-10-CM

## 2020-10-15 LAB — BASIC METABOLIC PANEL
Anion gap: 10 (ref 5–15)
BUN: 12 mg/dL (ref 8–23)
CO2: 35 mmol/L — ABNORMAL HIGH (ref 22–32)
Calcium: 8 mg/dL — ABNORMAL LOW (ref 8.9–10.3)
Chloride: 95 mmol/L — ABNORMAL LOW (ref 98–111)
Creatinine, Ser: 0.69 mg/dL (ref 0.44–1.00)
GFR, Estimated: >60 mL/min (ref >60)
Glucose, Bld: 137 mg/dL — ABNORMAL HIGH (ref 70–99)
Potassium: 3.3 mmol/L — ABNORMAL LOW (ref 3.5–5.1)
Sodium: 140 mmol/L (ref 135–145)

## 2020-10-15 LAB — CBC
HCT: 36.1 % (ref 36.0–46.0)
Hemoglobin: 11.8 g/dL — ABNORMAL LOW (ref 12.0–15.0)
MCH: 34.3 pg — ABNORMAL HIGH (ref 26.0–34.0)
MCHC: 32.7 g/dL (ref 30.0–36.0)
MCV: 104.9 fL — ABNORMAL HIGH (ref 80.0–100.0)
Platelets: 219 10*3/uL (ref 150–400)
RBC: 3.44 MIL/uL — ABNORMAL LOW (ref 3.87–5.11)
RDW: 14 % (ref 11.5–15.5)
WBC: 6.6 10*3/uL (ref 4.0–10.5)
nRBC: 0 % (ref 0.0–0.2)

## 2020-10-15 LAB — GLUCOSE, CAPILLARY
Glucose-Capillary: 119 mg/dL — ABNORMAL HIGH (ref 70–99)
Glucose-Capillary: 160 mg/dL — ABNORMAL HIGH (ref 70–99)
Glucose-Capillary: 143 mg/dL — ABNORMAL HIGH (ref 70–99)
Glucose-Capillary: 125 mg/dL — ABNORMAL HIGH (ref 70–99)

## 2020-10-15 LAB — MAGNESIUM: Magnesium: 1.7 mg/dL (ref 1.7–2.4)

## 2020-10-15 MED ORDER — POTASSIUM CHLORIDE CRYS ER 20 MEQ PO TBCR
20.0000 meq | EXTENDED_RELEASE_TABLET | Freq: Every day | ORAL | Status: DC
  Administered 2020-10-15 – 2020-10-16 (×2): 20 meq via ORAL
  Filled 2020-10-15 (×2): qty 1

## 2020-10-15 NOTE — Progress Notes (Addendum)
PROGRESS NOTE  Jasmin Martin BSW:[id number] DOB: 1956-03-24 DOA: 10/11/2020 PCP: Ponciano Ort [facility]  Brief History:  65 y.o. female with a history of HFpEF, atrial fibrillation, chronic 2L O2-dependent respiratory failure, HTN, and morbid obesity who presented to the ED 7/12 with progressive exertional dyspnea and leg swelling associated with 20 lbs weight gain despite augmenting torsemide dosing for 3 weeks. Respiratory status was stable on home 2L O2, but appeared grossly volume overloaded. IV diuresis was initiated and echocardiogram revealed LVEF 55-60% without WMA, mild LVH w/indeterminate diastolic parameters. RV mildly enlarged with pulmonary HTN (PASP ), normal systolic function. Circumferential pericardial effusion said to be trivial. Mild AS, mild-mod TR, calcified AV with mild AS. IVC was dilated and blunted. Cardiology is consulted for assistance with diuresis.  Assessment/Plan: Acute on chronic HFpEF: ?tachycardia mediated, though not in RVR currently. - Continue IV lasix--increase dose to 80mg  IV BID and ask for further recommendations from cardiology. - Net positive due to >2L intake yesterday. Initiate fluids restriction. Wt unchanged. - Strict I/O, monitor BMP and electrolytes, continue telemetry. - Continue daily weights.--NEG 2.4 lbs   -was 372 lbs in 05/2020 - Pt remains volume overloaded, continue IV lasix as ordered, appreciate cardiology recommendations.    permanent atrial fibrillation: - Continue metoprolol succinate 200mg  daily, diltiazem 300mg  daily - Continue eliquis   HTN:  -Continue metoprolol succinate and diltiazem CD   T2DM:  -Well-controlled with HbA1c 7.1%. (10/11/20) - Hold metformin, continue SSI   Chronic hypoxic respiratory failure: - Continue 2L O2. Was increased from 1L a couple months ago.   HLD: - Continue statin   GERD: - Continue PPI   Morbid obesity: Estimated body mass index is 67.06 kg/m as calculated from the  following:   Height as of this encounter: 5\' 4"  (1.626 m).   Weight as of this encounter: 177.2 kg.  Hypokalemia -check mag -replete     Status is: Inpatient  Remains inpatient appropriate because:IV treatments appropriate due to intensity of illness or inability to take PO  Dispo: The patient is from: Home              Anticipated d/c is to: Home              Patient currently is not medically stable to d/c.   Difficult to place patient No        Family Communication:   no Family at bedside  Consultants:  cardiology  Code Status:  FULL   DVT Prophylaxis:  apixaban   Procedures: As Listed in Progress Note Above  Antibiotics: None     Subjective: Breathing a little better .denies f/c, cp, n/v/d, abd pain  Objective: Vitals:   10/14/20 2152 10/15/20 0541 10/15/20 0859 10/15/20 1403  BP: 113/77 125/61 (!) 122/57 (!) 111/56  Pulse: 66 60 76 73  Resp: 19 18  16   Temp: 97.9 F (36.6 C) (!) 97.5 F (36.4 C)  97.8 F (36.6 C)  TempSrc: Oral Oral  Oral  SpO2: 97% 96%  98%  Weight:  (!) 175.4 kg    Height:        Intake/Output Summary (Last 24 hours) at 10/15/2020 1755 Last data filed at 10/15/2020 1700 Gross per 24 hour  Intake 1080 ml  Output 1800 ml  Net -720 ml   Weight change: -1.8 kg Exam:  General:  Pt is alert, follows commands appropriately, not in acute distress HEENT: No icterus, No thrush,  No neck mass, [place]/AT Cardiovascular: IRRR, S1/S2, no rubs, no gallops Respiratory: bibasilar rales. No wheeze Abdomen: Soft/+BS, non tender, non distended, no guarding Extremities: 2+LE edema, No lymphangitis, No petechiae, No rashes, no synovitis   Data Reviewed: I have personally reviewed following labs and imaging studies Basic Metabolic Panel: Recent Labs  Lab 10/11/20 1719 10/12/20 0418 10/13/20 0528 10/14/20 0548 10/15/20 0551  NA 140 141 142 140 140  K 4.0 3.2* 3.6 3.7 3.3*  CL 101 99 100 97* 95*  CO2 30 27 34* 34* 35*  GLUCOSE  139* 127* 131* 122* 137*  BUN 12 11 14 13 12   CREATININE 0.76 0.66 0.80 0.75 0.69  CALCIUM 8.3* 8.3* 8.1* 8.0* 8.0*  MG 1.5* 1.8  --   --  1.7   Liver Function Tests: No results for input(s): AST, ALT, ALKPHOS, BILITOT, PROT, ALBUMIN in the last 168 hours. No results for input(s): LIPASE, AMYLASE in the last 168 hours. No results for input(s): AMMONIA in the last 168 hours. Coagulation Profile: No results for input(s): INR, PROTIME in the last 168 hours. CBC: Recent Labs  Lab 10/11/20 1719 10/12/20 0418 10/15/20 0551  WBC 6.5 6.4 6.6  HGB 12.3 11.5* 11.8*  HCT 38.2 35.8* 36.1  MCV 104.9* 104.1* 104.9*  PLT 250 219 219   Cardiac Enzymes: No results for input(s): CKTOTAL, CKMB, CKMBINDEX, TROPONINI in the last 168 hours. BNP: Invalid input(s): POCBNP CBG: Recent Labs  Lab 10/14/20 1623 10/14/20 2149 10/15/20 0732 10/15/20 1123 10/15/20 1606  GLUCAP 152* 119* 119* 160* 143*   HbA1C: No results for input(s): HGBA1C in the last 72 hours. Urine analysis:    Component Value Date/Time   COLORURINE AMBER BIOCHEMICALS MAY BE AFFECTED BY COLOR (A) 05/23/2009 2020   APPEARANCEUR CLEAR 05/23/2009 2020   LABSPEC >1.030 (H) 05/23/2009 2020   PHURINE 5.5 05/23/2009 2020   GLUCOSEU 100 (A) 05/23/2009 2020   HGBUR MODERATE (A) 05/23/2009 2020   BILIRUBINUR MODERATE (A) 05/23/2009 2020   KETONESUR NEGATIVE 05/23/2009 2020   PROTEINUR >300 (A) 05/23/2009 2020   UROBILINOGEN 0.2 05/23/2009 2020   NITRITE POSITIVE (A) 05/23/2009 2020   LEUKOCYTESUR NEGATIVE 05/23/2009 2020   Sepsis Labs: @LABRCNTIP (procalcitonin:4,lacticidven:4) ) Recent Results (from the past 240 hour(s))  SARS CORONAVIRUS 2 (Saralyn Willison 6-24 HRS) Nasopharyngeal Nasopharyngeal Swab     Status: None   Collection Time: 10/11/20  5:20 PM   Specimen: Nasopharyngeal Swab  Result Value Ref Range Status   SARS Coronavirus 2 NEGATIVE NEGATIVE Final    Comment: (NOTE) SARS-CoV-2 target nucleic acids are NOT  DETECTED.  The SARS-CoV-2 RNA is generally detectable in upper and lower respiratory specimens during the acute phase of infection. Negative results do not preclude SARS-CoV-2 infection, do not rule out co-infections with other pathogens, and should not be used as the sole basis for treatment or other patient management decisions. Negative results must be combined with clinical observations, patient history, and epidemiological information. The expected result is Negative.  Fact Sheet for Patients:  Fact Sheet for Healthcare Providers: 12/12/20  This test is not yet approved or cleared by the [employer].[employer] FDA and  has been authorized for detection and/or diagnosis of SARS-CoV-2 by FDA under an Emergency Use Authorization (EUA). This EUA will remain  in effect (meaning this test can be used) for the duration of the COVID-19 declaration under Se ction 564(b)(1) of the Act, 21 U.S.C. section 360bbb-3(b)(1), unless the authorization is terminated or revoked sooner.  Performed at [facility]  [facility], [address] [place], [place] [address]      Scheduled Meds:  apixaban  5 mg Oral BID   diltiazem  300 mg Oral Daily   furosemide  80 mg Intravenous BID   insulin aspart  0-5 Units Subcutaneous QHS   insulin aspart  0-9 Units Subcutaneous TID WC   metoprolol  200 mg Oral Daily   pravastatin  40 mg Oral Daily   Continuous Infusions:  Procedures/Studies: DG Chest Portable 1 View  Result Date: 10/11/2020 CLINICAL DATA:  Swelling and shortness of breath. EXAM: PORTABLE CHEST 1 VIEW COMPARISON:  PA and lateral chest 09/14/2020. Single-view of the chest 08/07/2020. FINDINGS: Lungs are clear. Cardiomegaly. Aortic atherosclerosis. No pneumothorax or pleural fluid. IMPRESSION: No acute disease. Cardiomegaly. Aortic Atherosclerosis (ICD10-I70.0). Electronically Signed   By: Drusilla Kanner M.D.   On: 10/11/2020  18:56   ECHOCARDIOGRAM COMPLETE  Result Date: 10/12/2020    ECHOCARDIOGRAM REPORT   Patient Name:   Jasmin Martin Date of Exam: 10/12/2020 Medical Rec #:  [id number]     Height:       64.0 in Accession #:    [id number]    Weight:       390.0 lb Date of Birth:  1955/12/26     BSA:          2.598 m Patient Age:    9 years      BP:           105/52 mmHg Patient Gender: F             HR:           67 bpm. [facility] Procedure: 2D Echo, Cardiac Doppler and Color Doppler Indications:    Congestive Heart Failure I50.9  History:        Patient has prior history of Echocardiogram examinations, most                 recent 06/17/2014. CHF, Arrythmias:Atrial Fibrillation; Risk                 Factors:Hypertension. Long term current use of anticoagulant,                 Morbid Obesity, Palpitations.  Sonographer:    Mikki Harbor Referring Phys: [phone] Heloise Beecham EMOKPAE IMPRESSIONS  1. Left ventricular ejection fraction, by estimation, is 55 to 60%. The left ventricle has normal function. The left ventricle has no regional wall motion abnormalities. There is mild left ventricular hypertrophy. Left ventricular diastolic parameters are indeterminate.  2. Right ventricular systolic function is normal. The right ventricular size is mildly enlarged. There is mildly elevated pulmonary artery systolic pressure.  3. The pericardial effusion is circumferential.  4. The mitral valve is abnormal. No evidence of mitral valve regurgitation. Mild mitral stenosis.  5. The tricuspid valve is abnormal. Tricuspid valve regurgitation is mild to moderate.  6. The aortic valve has an indeterminant number of cusps. There is moderate calcification of the aortic valve. There is moderate thickening of the aortic valve. Aortic valve regurgitation is not visualized. Mild aortic valve stenosis. Aortic valve mean gradient measures 13.0 mmHg. Aortic valve peak gradient measures 25.0 mmHg. Aortic valve area, by VTI measures 1.41 cm.  7.  Moderate pulmonary HTN, PASP is 41 mmHg.  8. The inferior vena cava is dilated in size with >50% respiratory variability, suggesting right atrial pressure of 8 mmHg. FINDINGS  Left Ventricle: Left ventricular ejection fraction, by estimation, is  55 to 60%. The left ventricle has normal function. The left ventricle has no regional wall motion abnormalities. The left ventricular internal cavity size was normal in size. There is  mild left ventricular hypertrophy. Left ventricular diastolic parameters are indeterminate. Right Ventricle: The ventricular septum is flattened in systole suggesting RV pressure overload. The right ventricular size is mildly enlarged. No increase in right ventricular wall thickness. Right ventricular systolic function is normal. There is mildly elevated pulmonary artery systolic pressure. The tricuspid regurgitant velocity is 2.86 m/s, and with an assumed right atrial pressure of 8 mmHg, the estimated right ventricular systolic pressure is 40.7 mmHg. Left Atrium: Left atrial size was normal in size. Right Atrium: Right atrial size was normal in size. Pericardium: Trivial pericardial effusion is present. The pericardial effusion is circumferential. Mitral Valve: The mitral valve is abnormal. There is moderate thickening of the mitral valve leaflet(s). There is moderate calcification of the mitral valve leaflet(s). Mild to moderate mitral annular calcification. No evidence of mitral valve regurgitation. Mild mitral valve stenosis. MV peak gradient, 13.7 mmHg. The mean mitral valve gradient is 5.0 mmHg. Tricuspid Valve: The tricuspid valve is abnormal. Tricuspid valve regurgitation is mild to moderate. No evidence of tricuspid stenosis. Aortic Valve: The aortic valve has an indeterminant number of cusps. There is moderate calcification of the aortic valve. There is moderate thickening of the aortic valve. There is moderate aortic valve annular calcification. Aortic valve regurgitation is not  visualized. Mild aortic stenosis is present. Aortic valve mean gradient measures 13.0 mmHg. Aortic valve peak gradient measures 25.0 mmHg. Aortic valve area, by VTI measures 1.41 cm. Pulmonic Valve: The pulmonic valve was not well visualized. Pulmonic valve regurgitation is not visualized. No evidence of pulmonic stenosis. Aorta: The aortic root is normal in size and structure. Pulmonary Artery: Moderate pulmonary HTN, PASP is 41 mmHg. Venous: The inferior vena cava is dilated in size with greater than 50% respiratory variability, suggesting right atrial pressure of 8 mmHg. IAS/Shunts: No atrial level shunt detected by color flow Doppler.  LEFT VENTRICLE PLAX 2D LVIDd:         5.00 cm  Diastology LVIDs:         3.29 cm  LV e' medial:    10.70 cm/s LV PW:         1.22 cm  LV E/e' medial:  14.1 LV IVS:        1.03 cm  LV e' lateral:   11.10 cm/s LVOT diam:     2.00 cm  LV E/e' lateral: 13.6 LV SV:         90 LV SV Index:   35 LVOT Area:     3.14 cm  RIGHT VENTRICLE RV Basal diam:  4.86 cm RV Mid diam:    3.84 cm RV S prime:     9.37 cm/s TAPSE (M-mode): 2.7 cm LEFT ATRIUM             Index       RIGHT ATRIUM           Index LA diam:        4.70 cm 1.81 cm/m  RA Area:     25.90 cm LA Vol (A2C):   78.4 ml 30.17 ml/m RA Volume:   84.50 ml  32.52 ml/m LA Vol (A4C):   71.5 ml 27.52 ml/m LA Biplane Vol: 75.1 ml 28.90 ml/m  AORTIC VALVE AV Area (Vmax):    1.63 cm AV Area (Vmean):   1.59 cm  AV Area (VTI):     1.41 cm AV Vmax:           250.00 cm/s AV Vmean:          170.000 cm/s AV VTI:            0.640 m AV Peak Grad:      25.0 mmHg AV Mean Grad:      13.0 mmHg LVOT Vmax:         129.82 cm/s LVOT Vmean:        85.786 cm/s LVOT VTI:          0.287 m LVOT/AV VTI ratio: 0.45  AORTA Ao Root diam: 2.80 cm MITRAL VALVE                TRICUSPID VALVE MV Area (PHT): 2.62 cm     TR Peak grad:   32.7 mmHg MV Peak grad:  13.7 mmHg    TR Vmax:        286.00 cm/s MV Mean grad:  5.0 mmHg MV Vmax:       1.85 m/s     SHUNTS MV  Vmean:      106.0 cm/s   Systemic VTI:  0.29 m MV Decel Time: 289 msec     Systemic Diam: 2.00 cm MV E velocity: 151.00 cm/s Dina RichJonathan Branch MD Electronically signed by Dina RichJonathan Branch MD Signature Date/Time: 10/12/2020/4:47:44 PM    Final     Catarina Hartshornavid Dashaun Onstott, DO  [facility]  If 7PM-7AM, please contact night-coverage [email or web address] Password [email or web address] 10/15/2020, 5:55 PM   LOS: 4 days

## 2020-10-16 LAB — BASIC METABOLIC PANEL
Anion gap: 10 (ref 5–15)
BUN: 11 mg/dL (ref 8–23)
CO2: 35 mmol/L — ABNORMAL HIGH (ref 22–32)
Calcium: 8 mg/dL — ABNORMAL LOW (ref 8.9–10.3)
Chloride: 93 mmol/L — ABNORMAL LOW (ref 98–111)
Creatinine, Ser: 0.72 mg/dL (ref 0.44–1.00)
GFR, Estimated: >60 mL/min (ref >60)
Glucose, Bld: 144 mg/dL — ABNORMAL HIGH (ref 70–99)
Potassium: 2.9 mmol/L — ABNORMAL LOW (ref 3.5–5.1)
Sodium: 138 mmol/L (ref 135–145)

## 2020-10-16 LAB — GLUCOSE, CAPILLARY
Glucose-Capillary: 135 mg/dL — ABNORMAL HIGH (ref 70–99)
Glucose-Capillary: 152 mg/dL — ABNORMAL HIGH (ref 70–99)
Glucose-Capillary: 120 mg/dL — ABNORMAL HIGH (ref 70–99)
Glucose-Capillary: 119 mg/dL — ABNORMAL HIGH (ref 70–99)

## 2020-10-16 LAB — CBC
HCT: 37.6 % (ref 36.0–46.0)
Hemoglobin: 12.4 g/dL (ref 12.0–15.0)
MCH: 34.2 pg — ABNORMAL HIGH (ref 26.0–34.0)
MCHC: 33 g/dL (ref 30.0–36.0)
MCV: 103.6 fL — ABNORMAL HIGH (ref 80.0–100.0)
Platelets: 220 10*3/uL (ref 150–400)
RBC: 3.63 MIL/uL — ABNORMAL LOW (ref 3.87–5.11)
RDW: 13.9 % (ref 11.5–15.5)
WBC: 5.9 10*3/uL (ref 4.0–10.5)
nRBC: 0 % (ref 0.0–0.2)

## 2020-10-16 LAB — MAGNESIUM: Magnesium: 1.7 mg/dL (ref 1.7–2.4)

## 2020-10-16 MED ORDER — POTASSIUM CHLORIDE CRYS ER 20 MEQ PO TBCR
40.0000 meq | EXTENDED_RELEASE_TABLET | Freq: Once | ORAL | Status: AC
  Administered 2020-10-16: 40 meq via ORAL
  Filled 2020-10-16: qty 2

## 2020-10-16 MED ORDER — POTASSIUM CHLORIDE CRYS ER 20 MEQ PO TBCR
40.0000 meq | EXTENDED_RELEASE_TABLET | Freq: Every day | ORAL | Status: DC
  Administered 2020-10-17: 40 meq via ORAL
  Filled 2020-10-16: qty 2

## 2020-10-16 MED ORDER — MAGNESIUM SULFATE 2 GM/50ML IV SOLN
2.0000 g | Freq: Once | INTRAVENOUS | Status: AC
  Administered 2020-10-16: 2 g via INTRAVENOUS
  Filled 2020-10-16: qty 50

## 2020-10-16 NOTE — Progress Notes (Signed)
PROGRESS NOTE  Jasmin Martin WUJ:[id number] DOB: 26-Nov-1955 DOA: 10/11/2020 PCP: [facility]  Brief History:  65 y.o. female with a history of HFpEF, atrial fibrillation, chronic 2L O2-dependent respiratory failure, HTN, and morbid obesity who presented to the ED 7/12 with progressive exertional dyspnea and leg swelling associated with 20 lbs weight gain despite augmenting torsemide dosing for 3 weeks. Respiratory status was stable on home 2L O2, but appeared grossly volume overloaded. IV diuresis was initiated and echocardiogram revealed LVEF 55-60% without WMA, mild LVH w/indeterminate diastolic parameters. RV mildly enlarged with pulmonary HTN (PASP 41mmHg), normal systolic function. Circumferential pericardial effusion said to be trivial. Mild AS, mild-mod TR, calcified AV with mild AS. IVC was dilated and blunted. Cardiology is consulted for assistance with diuresis.   Assessment/Plan: Acute on chronic HFpEF: ?tachycardia mediated, though not in RVR currently. - Continue IV lasix--increase dose to 80mg  IV BID and ask for further recommendations from cardiology. - Initiate fluids restriction. Wt unchanged. - Strict I/O, monitor BMP and electrolytes, continue telemetry. - Continue daily weights.--NEG 10 lbs   -was 372 lbs in 05/2020 - Pt remains volume overloaded, continue IV lasix as ordered, appreciate cardiology recommendations.    permanent atrial fibrillation: - Continue metoprolol succinate 200mg  daily, diltiazem 300mg  daily - Continue eliquis   HTN:  -Continue metoprolol succinate and diltiazem CD   T2DM:  -Well-controlled with HbA1c 7.1%. (10/11/20) - Hold metformin, continue SSI   Chronic hypoxic respiratory failure: - Continue 2L O2. Was increased from 1L a couple months ago.   HLD: - Continue statin   GERD: - Continue PPI   Morbid obesity: Estimated body mass index is 67.06 kg/m as calculated from the following:   Height as of this encounter:  5\' 4"  (1.626 m).   Weight as of this encounter: 177.2 kg.   Hypokalemia -check mag--1.7 -replete         Status is: Inpatient   Remains inpatient appropriate because:IV treatments appropriate due to intensity of illness or inability to take PO   Dispo: The patient is from: Home              Anticipated d/c is to: Home              Patient currently is not medically stable to d/c.              Difficult to place patient No               Family Communication:   no Family at bedside   Consultants:  cardiology   Code Status:  FULL   DVT Prophylaxis:  apixaban     Procedures: As Listed in Progress Note Above   Antibiotics: None    Subjective: Patient denies fevers, chills, headache, chest pain, dyspnea, nausea, vomiting, diarrhea, abdominal pain, dysuria, hematuria, hematochezia, and melena.   Objective: Vitals:   10/16/20 0500 10/16/20 0854 10/16/20 0856 10/16/20 1457  BP:   138/85 104/60  Pulse:   85 70  Resp:    17  Temp:    97.8 F (36.6 C)  TempSrc:    Oral  SpO2:    99%  Weight: (!) 175.3 kg (!) 172.7 kg    Height:        Intake/Output Summary (Last 24 hours) at 10/16/2020 1638 Last data filed at 10/16/2020 1300 Gross per 24 hour  Intake 1780 ml  Output 1500 ml  Net 280 ml  Weight change: -0.1 kg Exam:  General:  Pt is alert, follows commands appropriately, not in acute distress HEENT: No icterus, No thrush, No neck mass, [place]/AT Cardiovascular: RRR, S1/S2, no rubs, no gallops Respiratory: CTA bilaterally, no wheezing, no crackles, no rhonchi Abdomen: Soft/+BS, non tender, non distended, no guarding Extremities: 1+LE edema, No lymphangitis, No petechiae, No rashes, no synovitis   Data Reviewed: I have personally reviewed following labs and imaging studies Basic Metabolic Panel: Recent Labs  Lab 10/11/20 1719 10/12/20 0418 10/13/20 0528 10/14/20 0548 10/15/20 0551 10/16/20 0514  NA 140 141 142 140 140 138  K 4.0 3.2* 3.6 3.7 3.3*  2.9*  CL 101 99 100 97* 95* 93*  CO2 30 27 34* 34* 35* 35*  GLUCOSE 139* 127* 131* 122* 137* 144*  BUN 12 11 14 13 12 11   CREATININE 0.76 0.66 0.80 0.75 0.69 0.72  CALCIUM 8.3* 8.3* 8.1* 8.0* 8.0* 8.0*  MG 1.5* 1.8  --   --  1.7 1.7   Liver Function Tests: No results for input(s): AST, ALT, ALKPHOS, BILITOT, PROT, ALBUMIN in the last 168 hours. No results for input(s): LIPASE, AMYLASE in the last 168 hours. No results for input(s): AMMONIA in the last 168 hours. Coagulation Profile: No results for input(s): INR, PROTIME in the last 168 hours. CBC: Recent Labs  Lab 10/11/20 1719 10/12/20 0418 10/15/20 0551 10/16/20 0514  WBC 6.5 6.4 6.6 5.9  HGB 12.3 11.5* 11.8* 12.4  HCT 38.2 35.8* 36.1 37.6  MCV 104.9* 104.1* 104.9* 103.6*  PLT 250 219 219 220   Cardiac Enzymes: No results for input(s): CKTOTAL, CKMB, CKMBINDEX, TROPONINI in the last 168 hours. BNP: Invalid input(s): POCBNP CBG: Recent Labs  Lab 10/15/20 1606 10/15/20 2138 10/16/20 0734 10/16/20 1207 10/16/20 1635  GLUCAP 143* 125* 135* 152* 120*   HbA1C: No results for input(s): HGBA1C in the last 72 hours. Urine analysis:    Component Value Date/Time   COLORURINE AMBER BIOCHEMICALS MAY BE AFFECTED BY COLOR (A) 05/23/2009 2020   APPEARANCEUR CLEAR 05/23/2009 2020   LABSPEC >1.030 (H) 05/23/2009 2020   PHURINE 5.5 05/23/2009 2020   GLUCOSEU 100 (A) 05/23/2009 2020   HGBUR MODERATE (A) 05/23/2009 2020   BILIRUBINUR MODERATE (A) 05/23/2009 2020   KETONESUR NEGATIVE 05/23/2009 2020   PROTEINUR >300 (A) 05/23/2009 2020   UROBILINOGEN 0.2 05/23/2009 2020   NITRITE POSITIVE (A) 05/23/2009 2020   LEUKOCYTESUR NEGATIVE 05/23/2009 2020   Sepsis Labs: @LABRCNTIP (procalcitonin:4,lacticidven:4) ) Recent Results (from the past 240 hour(s))  SARS CORONAVIRUS 2 (Suzannah Bettes 6-24 HRS) Nasopharyngeal Nasopharyngeal Swab     Status: None   Collection Time: 10/11/20  5:20 PM   Specimen: Nasopharyngeal Swab  Result Value Ref  Range Status   SARS Coronavirus 2 NEGATIVE NEGATIVE Final    Comment: (NOTE) SARS-CoV-2 target nucleic acids are NOT DETECTED.  The SARS-CoV-2 RNA is generally detectable in upper and lower respiratory specimens during the acute phase of infection. Negative results do not preclude SARS-CoV-2 infection, do not rule out co-infections with other pathogens, and should not be used as the sole basis for treatment or other patient management decisions. Negative results must be combined with clinical observations, patient history, and epidemiological information. The expected result is Negative.  Fact Sheet for Patients:  Fact Sheet for Healthcare Providers: 12/12/20  This test is not yet approved or cleared by the [email or web address] FDA and  has been authorized for detection and/or diagnosis of SARS-CoV-2 by FDA under an Emergency Use Authorization (EUA).  This EUA will remain  in effect (meaning this test can be used) for the duration of the COVID-19 declaration under Se ction 564(b)(1) of the Act, 21 U.S.C. section 360bbb-3(b)(1), unless the authorization is terminated or revoked sooner.  Performed at [facility], [address], [place], [place] [address]      Scheduled Meds:  apixaban  5 mg Oral BID   diltiazem  300 mg Oral Daily   furosemide  80 mg Intravenous BID   insulin aspart  0-5 Units Subcutaneous QHS   insulin aspart  0-9 Units Subcutaneous TID WC   metoprolol  200 mg Oral Daily   [START ON 10/17/2020] potassium chloride  40 mEq Oral Daily   potassium chloride  40 mEq Oral Once   pravastatin  40 mg Oral Daily   Continuous Infusions:  Procedures/Studies: DG Chest Portable 1 View  Result Date: 10/11/2020 CLINICAL DATA:  Swelling and shortness of breath. EXAM: PORTABLE CHEST 1 VIEW COMPARISON:  PA and lateral chest 09/14/2020. Single-view of the chest 08/07/2020. FINDINGS: Lungs are clear.  Cardiomegaly. Aortic atherosclerosis. No pneumothorax or pleural fluid. IMPRESSION: No acute disease. Cardiomegaly. Aortic Atherosclerosis (ICD10-I70.0). Electronically Signed   By: Drusilla Kanner M.D.   On: 10/11/2020 18:56   ECHOCARDIOGRAM COMPLETE  Result Date: 10/12/2020    ECHOCARDIOGRAM REPORT   Patient Name:   LATRELL REITAN Date of Exam: 10/12/2020 Medical Rec #:  [id number]     Height:       64.0 in Accession #:    [id number]    Weight:       390.0 lb Date of Birth:  18-Apr-1955     BSA:          2.598 m Patient Age:    52 years      BP:           105/52 mmHg Patient Gender: F             HR:           67 bpm. [facility] Procedure: 2D Echo, Cardiac Doppler and Color Doppler Indications:    Congestive Heart Failure I50.9  History:        Patient has prior history of Echocardiogram examinations, most                 recent 06/17/2014. CHF, Arrythmias:Atrial Fibrillation; Risk                 Factors:Hypertension. Long term current use of anticoagulant,                 Morbid Obesity, Palpitations.  Sonographer:    Mikki Harbor Referring Phys: [phone] Heloise Beecham EMOKPAE IMPRESSIONS  1. Left ventricular ejection fraction, by estimation, is 55 to 60%. The left ventricle has normal function. The left ventricle has no regional wall motion abnormalities. There is mild left ventricular hypertrophy. Left ventricular diastolic parameters are indeterminate.  2. Right ventricular systolic function is normal. The right ventricular size is mildly enlarged. There is mildly elevated pulmonary artery systolic pressure.  3. The pericardial effusion is circumferential.  4. The mitral valve is abnormal. No evidence of mitral valve regurgitation. Mild mitral stenosis.  5. The tricuspid valve is abnormal. Tricuspid valve regurgitation is mild to moderate.  6. The aortic valve has an indeterminant number of cusps. There is moderate calcification of the aortic valve. There is moderate thickening of the aortic  valve. Aortic valve regurgitation is not visualized. Mild  aortic valve stenosis. Aortic valve mean gradient measures 13.0 mmHg. Aortic valve peak gradient measures 25.0 mmHg. Aortic valve area, by VTI measures 1.41 cm.  7. Moderate pulmonary HTN, PASP is 41 mmHg.  8. The inferior vena cava is dilated in size with >50% respiratory variability, suggesting right atrial pressure of 8 mmHg. FINDINGS  Left Ventricle: Left ventricular ejection fraction, by estimation, is 55 to 60%. The left ventricle has normal function. The left ventricle has no regional wall motion abnormalities. The left ventricular internal cavity size was normal in size. There is  mild left ventricular hypertrophy. Left ventricular diastolic parameters are indeterminate. Right Ventricle: The ventricular septum is flattened in systole suggesting RV pressure overload. The right ventricular size is mildly enlarged. No increase in right ventricular wall thickness. Right ventricular systolic function is normal. There is mildly elevated pulmonary artery systolic pressure. The tricuspid regurgitant velocity is 2.86 m/s, and with an assumed right atrial pressure of 8 mmHg, the estimated right ventricular systolic pressure is 40.7 mmHg. Left Atrium: Left atrial size was normal in size. Right Atrium: Right atrial size was normal in size. Pericardium: Trivial pericardial effusion is present. The pericardial effusion is circumferential. Mitral Valve: The mitral valve is abnormal. There is moderate thickening of the mitral valve leaflet(s). There is moderate calcification of the mitral valve leaflet(s). Mild to moderate mitral annular calcification. No evidence of mitral valve regurgitation. Mild mitral valve stenosis. MV peak gradient, 13.7 mmHg. The mean mitral valve gradient is 5.0 mmHg. Tricuspid Valve: The tricuspid valve is abnormal. Tricuspid valve regurgitation is mild to moderate. No evidence of tricuspid stenosis. Aortic Valve: The aortic valve has an  indeterminant number of cusps. There is moderate calcification of the aortic valve. There is moderate thickening of the aortic valve. There is moderate aortic valve annular calcification. Aortic valve regurgitation is not visualized. Mild aortic stenosis is present. Aortic valve mean gradient measures 13.0 mmHg. Aortic valve peak gradient measures 25.0 mmHg. Aortic valve area, by VTI measures 1.41 cm. Pulmonic Valve: The pulmonic valve was not well visualized. Pulmonic valve regurgitation is not visualized. No evidence of pulmonic stenosis. Aorta: The aortic root is normal in size and structure. Pulmonary Artery: Moderate pulmonary HTN, PASP is 41 mmHg. Venous: The inferior vena cava is dilated in size with greater than 50% respiratory variability, suggesting right atrial pressure of 8 mmHg. IAS/Shunts: No atrial level shunt detected by color flow Doppler.  LEFT VENTRICLE PLAX 2D LVIDd:         5.00 cm  Diastology LVIDs:         3.29 cm  LV e' medial:    10.70 cm/s LV PW:         1.22 cm  LV E/e' medial:  14.1 LV IVS:        1.03 cm  LV e' lateral:   11.10 cm/s LVOT diam:     2.00 cm  LV E/e' lateral: 13.6 LV SV:         90 LV SV Index:   35 LVOT Area:     3.14 cm  RIGHT VENTRICLE RV Basal diam:  4.86 cm RV Mid diam:    3.84 cm RV S prime:     9.37 cm/s TAPSE (M-mode): 2.7 cm LEFT ATRIUM             Index       RIGHT ATRIUM           Index LA diam:  4.70 cm 1.81 cm/m  RA Area:     25.90 cm LA Vol (A2C):   78.4 ml 30.17 ml/m RA Volume:   84.50 ml  32.52 ml/m LA Vol (A4C):   71.5 ml 27.52 ml/m LA Biplane Vol: 75.1 ml 28.90 ml/m  AORTIC VALVE AV Area (Vmax):    1.63 cm AV Area (Vmean):   1.59 cm AV Area (VTI):     1.41 cm AV Vmax:           250.00 cm/s AV Vmean:          170.000 cm/s AV VTI:            0.640 m AV Peak Grad:      25.0 mmHg AV Mean Grad:      13.0 mmHg LVOT Vmax:         129.82 cm/s LVOT Vmean:        85.786 cm/s LVOT VTI:          0.287 m LVOT/AV VTI ratio: 0.45  AORTA Ao Root diam:  2.80 cm MITRAL VALVE                TRICUSPID VALVE MV Area (PHT): 2.62 cm     TR Peak grad:   32.7 mmHg MV Peak grad:  13.7 mmHg    TR Vmax:        286.00 cm/s MV Mean grad:  5.0 mmHg MV Vmax:       1.85 m/s     SHUNTS MV Vmean:      106.0 cm/s   Systemic VTI:  0.29 m MV Decel Time: 289 msec     Systemic Diam: 2.00 cm MV E velocity: 151.00 cm/s Dina Rich MD Electronically signed by Dina Rich MD Signature Date/Time: 10/12/2020/4:47:44 PM    Final     Catarina Hartshorn, DO  [facility]  If 7PM-7AM, please contact night-coverage [email or web address] Password [facility] 10/16/2020, 4:38 PM   LOS: 5 days

## 2020-10-17 LAB — GLUCOSE, CAPILLARY
Glucose-Capillary: 133 mg/dL — ABNORMAL HIGH (ref 70–99)
Glucose-Capillary: 138 mg/dL — ABNORMAL HIGH (ref 70–99)

## 2020-10-17 LAB — BASIC METABOLIC PANEL
Anion gap: 11 (ref 5–15)
BUN: 10 mg/dL (ref 8–23)
CO2: 35 mmol/L — ABNORMAL HIGH (ref 22–32)
Calcium: 8 mg/dL — ABNORMAL LOW (ref 8.9–10.3)
Chloride: 93 mmol/L — ABNORMAL LOW (ref 98–111)
Creatinine, Ser: 0.66 mg/dL (ref 0.44–1.00)
GFR, Estimated: >60 mL/min (ref >60)
Glucose, Bld: 150 mg/dL — ABNORMAL HIGH (ref 70–99)
Potassium: 3.3 mmol/L — ABNORMAL LOW (ref 3.5–5.1)
Sodium: 139 mmol/L (ref 135–145)

## 2020-10-17 LAB — MAGNESIUM: Magnesium: 2 mg/dL (ref 1.7–2.4)

## 2020-10-17 MED ORDER — TORSEMIDE 20 MG PO TABS: 60.0000 mg | ORAL_TABLET | Freq: Every day | ORAL | Status: DC

## 2020-10-17 MED ORDER — POTASSIUM CHLORIDE CRYS ER 20 MEQ PO TBCR: 20.0000 meq | EXTENDED_RELEASE_TABLET | Freq: Every day | ORAL | 1 refills | Status: DC

## 2020-10-17 MED ORDER — TORSEMIDE 60 MG PO TABS: 60.0000 mg | ORAL_TABLET | Freq: Every day | ORAL | 1 refills | Status: DC

## 2020-10-17 NOTE — Plan of Care (Signed)
  Problem: Education: Goal: Knowledge of General Education information will improve Description Including pain rating scale, medication(s)/side effects and non-pharmacologic comfort measures Outcome: Progressing   Problem: Health Behavior/Discharge Planning: Goal: Ability to manage health-related needs will improve Outcome: Progressing   

## 2020-10-17 NOTE — Progress Notes (Signed)
IV removed , discharge instructions reviewed and ex-husband present to give ride home.  Meds sent to pharmacy  and f/u with Dr. Wyline Mood scheduled.

## 2020-10-17 NOTE — Discharge Summary (Signed)
Physician Discharge Summary  Jasmin Martin WJX:[id number] DOB: 04/29/1955 DOA: 10/11/2020  PCP: [facility]  Admit date: 10/11/2020 Discharge date: 10/17/2020  Admitted From: Home Disposition:  Home   Recommendations for Outpatient Follow-up:  Follow up with PCP in 1-2 weeks Please obtain BMP/CBC in one week     Discharge Condition: Stable CODE STATUS:FULL Diet recommendation: Heart Healthy / Carb Modified   Brief/Interim Summary: 65 y.o. female with a history of HFpEF, atrial fibrillation, chronic 2L O2-dependent respiratory failure, HTN, and morbid obesity who presented to the ED 7/12 with progressive exertional dyspnea and leg swelling associated with 20 lbs weight gain despite augmenting torsemide dosing for 3 weeks. Respiratory status was stable on home 2L O2, but appeared grossly volume overloaded. IV diuresis was initiated and echocardiogram revealed LVEF 55-60% without WMA, mild LVH w/indeterminate diastolic parameters. RV mildly enlarged with pulmonary HTN (PASP ), normal systolic function. Circumferential pericardial effusion said to be trivial. Mild AS, mild-mod TR, calcified AV with mild AS. IVC was dilated and blunted. Cardiology is consulted for assistance with diuresis.  Discharge Diagnoses:  Acute on chronic HFpEF: ?tachycardia mediated, though not in RVR currently. - Continue IV lasix--increase dose to  IV BID and ask for further recommendations from cardiology. - Initiate fluids restriction. Wt unchanged. - Strict I/O, monitor BMP and electrolytes, continue telemetry. - Continue daily weights.--NEG 10 lbs   -was 372 lbs in 05/2020 -continue IV lasix 80 mg IV bid during hospitalization, -appreciate cardiology recommendations -10/17/20--cardiology cleared patient to go home with torsemide 60 mg daily   permanent atrial fibrillation: - Continue metoprolol succinate  daily, diltiazem  daily - Continue eliquis   HTN:  -Continue  metoprolol succinate and diltiazem CD   T2DM:  -Well-controlled with HbA1c 7.1%. (10/11/20) - Hold metformin--resume after discharge -continue SSI   Chronic hypoxic respiratory failure: - Continue 2L O2. Was increased from 1L a couple months ago.   HLD: - Continue statin   GERD: - Continue PPI   Morbid obesity: Estimated body mass index is 67.06 kg/m as calculated from the following:   Height as of this encounter:  (1.626 m).   Weight as of this encounter: 177.2 kg.   Hypokalemia -check mag--1.7 -repleted   Discharge Instructions   Allergies as of 10/17/2020   No Known Allergies      Medication List     STOP taking these medications    doxycycline 100 MG capsule Commonly known as: VIBRAMYCIN   lisinopril 20 MG tablet Commonly known as: ZESTRIL   pantoprazole 40 MG tablet Commonly known as: PROTONIX   potassium gluconate 595 (99 K) MG Tabs tablet   predniSONE 20 MG tablet Commonly known as: DELTASONE       TAKE these medications    albuterol 108 (90 Base) MCG/ACT inhaler Commonly known as: VENTOLIN HFA Inhale 2 puffs into the lungs every 6 (six) hours as needed for wheezing or shortness of breath.   apixaban 5 MG Tabs tablet Commonly known as: ELIQUIS Take 5 mg by mouth 2 (two) times daily.   diltiazem 300 MG 24 hr capsule Commonly known as: CARDIZEM CD TAKE ONE CAPSULE BY MOUTH ONCE DAILY.   hydrOXYzine 25 MG tablet Commonly known as: ATARAX/VISTARIL Take 25 mg by mouth every 8 (eight) hours as needed.   metFORMIN 500 MG tablet Commonly known as: GLUCOPHAGE Take 500 mg by mouth every morning.   metoprolol 200 MG 24 hr tablet Commonly known as: TOPROL-XL TAKE (1) TABLET BY MOUTH  ONCE DAILY.   oxyCODONE-acetaminophen 10-325 MG tablet Commonly known as: PERCOCET Take 1 tablet by mouth every 6 (six) hours.   OXYGEN Inhale 2 L into the lungs daily.   potassium chloride SA 20 MEQ tablet Commonly known as: KLOR-CON Take 1 tablet  (20 mEq total) by mouth daily. Start taking on: October 18, 2020   pravastatin 40 MG tablet Commonly known as: PRAVACHOL Take 40 mg by mouth daily.   Torsemide 60 MG Tabs Take 60 mg by mouth daily. Start taking on: October 18, 2020 What changed:  medication strength how much to take how to take this when to take this additional instructions        No Known Allergies  Consultations: cardiology   Procedures/Studies: DG Chest Portable 1 View  Result Date: 10/11/2020 CLINICAL DATA:  Swelling and shortness of breath. EXAM: PORTABLE CHEST 1 VIEW COMPARISON:  PA and lateral chest 09/14/2020. Single-view of the chest 08/07/2020. FINDINGS: Lungs are clear. Cardiomegaly. Aortic atherosclerosis. No pneumothorax or pleural fluid. IMPRESSION: No acute disease. Cardiomegaly. Aortic Atherosclerosis (ICD10-I70.0). Electronically Signed   By: Drusilla Kanner M.D.   On: 10/11/2020 18:56   ECHOCARDIOGRAM COMPLETE  Result Date: 10/12/2020    ECHOCARDIOGRAM REPORT   Patient Name:   Jasmin Martin Date of Exam: 10/12/2020 Medical Rec #:  [id number]     Height:       64.0 in Accession #:    [id number]    Weight:       390.0 lb Date of Birth:  19-Aug-1955     BSA:          2.598 m Patient Age:    48 years      BP:           105/52 mmHg Patient Gender: F             HR:           67 bpm. [facility] Procedure: 2D Echo, Cardiac Doppler and Color Doppler Indications:    Congestive Heart Failure I50.9  History:        Patient has prior history of Echocardiogram examinations, most                 recent 06/17/2014. CHF, Arrythmias:Atrial Fibrillation; Risk                 Factors:Hypertension. Long term current use of anticoagulant,                 Morbid Obesity, Palpitations.  Sonographer:    Mikki Harbor Referring Phys: [phone] Heloise Beecham EMOKPAE IMPRESSIONS  1. Left ventricular ejection fraction, by estimation, is 55 to 60%. The left ventricle has normal function. The left ventricle has no regional  wall motion abnormalities. There is mild left ventricular hypertrophy. Left ventricular diastolic parameters are indeterminate.  2. Right ventricular systolic function is normal. The right ventricular size is mildly enlarged. There is mildly elevated pulmonary artery systolic pressure.  3. The pericardial effusion is circumferential.  4. The mitral valve is abnormal. No evidence of mitral valve regurgitation. Mild mitral stenosis.  5. The tricuspid valve is abnormal. Tricuspid valve regurgitation is mild to moderate.  6. The aortic valve has an indeterminant number of cusps. There is moderate calcification of the aortic valve. There is moderate thickening of the aortic valve. Aortic valve regurgitation is not visualized. Mild aortic valve stenosis. Aortic valve mean gradient measures 13.0 mmHg. Aortic valve peak gradient measures 25.0 mmHg. Aortic  valve area, by VTI measures 1.41 cm.  7. Moderate pulmonary HTN, PASP is 41 mmHg.  8. The inferior vena cava is dilated in size with >50% respiratory variability, suggesting right atrial pressure of 8 mmHg. FINDINGS  Left Ventricle: Left ventricular ejection fraction, by estimation, is 55 to 60%. The left ventricle has normal function. The left ventricle has no regional wall motion abnormalities. The left ventricular internal cavity size was normal in size. There is  mild left ventricular hypertrophy. Left ventricular diastolic parameters are indeterminate. Right Ventricle: The ventricular septum is flattened in systole suggesting RV pressure overload. The right ventricular size is mildly enlarged. No increase in right ventricular wall thickness. Right ventricular systolic function is normal. There is mildly elevated pulmonary artery systolic pressure. The tricuspid regurgitant velocity is 2.86 m/s, and with an assumed right atrial pressure of 8 mmHg, the estimated right ventricular systolic pressure is 40.7 mmHg. Left Atrium: Left atrial size was normal in size. Right  Atrium: Right atrial size was normal in size. Pericardium: Trivial pericardial effusion is present. The pericardial effusion is circumferential. Mitral Valve: The mitral valve is abnormal. There is moderate thickening of the mitral valve leaflet(s). There is moderate calcification of the mitral valve leaflet(s). Mild to moderate mitral annular calcification. No evidence of mitral valve regurgitation. Mild mitral valve stenosis. MV peak gradient, 13.7 mmHg. The mean mitral valve gradient is 5.0 mmHg. Tricuspid Valve: The tricuspid valve is abnormal. Tricuspid valve regurgitation is mild to moderate. No evidence of tricuspid stenosis. Aortic Valve: The aortic valve has an indeterminant number of cusps. There is moderate calcification of the aortic valve. There is moderate thickening of the aortic valve. There is moderate aortic valve annular calcification. Aortic valve regurgitation is not visualized. Mild aortic stenosis is present. Aortic valve mean gradient measures 13.0 mmHg. Aortic valve peak gradient measures 25.0 mmHg. Aortic valve area, by VTI measures 1.41 cm. Pulmonic Valve: The pulmonic valve was not well visualized. Pulmonic valve regurgitation is not visualized. No evidence of pulmonic stenosis. Aorta: The aortic root is normal in size and structure. Pulmonary Artery: Moderate pulmonary HTN, PASP is 41 mmHg. Venous: The inferior vena cava is dilated in size with greater than 50% respiratory variability, suggesting right atrial pressure of 8 mmHg. IAS/Shunts: No atrial level shunt detected by color flow Doppler.  LEFT VENTRICLE PLAX 2D LVIDd:         5.00 cm  Diastology LVIDs:         3.29 cm  LV e' medial:    10.70 cm/s LV PW:         1.22 cm  LV E/e' medial:  14.1 LV IVS:        1.03 cm  LV e' lateral:   11.10 cm/s LVOT diam:     2.00 cm  LV E/e' lateral: 13.6 LV SV:         90 LV SV Index:   35 LVOT Area:     3.14 cm  RIGHT VENTRICLE RV Basal diam:  4.86 cm RV Mid diam:    3.84 cm RV S prime:     9.37  cm/s TAPSE (M-mode): 2.7 cm LEFT ATRIUM             Index       RIGHT ATRIUM           Index LA diam:        4.70 cm 1.81 cm/m  RA Area:     25.90 cm LA Vol (A2C):  78.4 ml 30.17 ml/m RA Volume:   84.50 ml  32.52 ml/m LA Vol (A4C):   71.5 ml 27.52 ml/m LA Biplane Vol: 75.1 ml 28.90 ml/m  AORTIC VALVE AV Area (Vmax):    1.63 cm AV Area (Vmean):   1.59 cm AV Area (VTI):     1.41 cm AV Vmax:           250.00 cm/s AV Vmean:          170.000 cm/s AV VTI:            0.640 m AV Peak Grad:      25.0 mmHg AV Mean Grad:      13.0 mmHg LVOT Vmax:         129.82 cm/s LVOT Vmean:        85.786 cm/s LVOT VTI:          0.287 m LVOT/AV VTI ratio: 0.45  AORTA Ao Root diam: 2.80 cm MITRAL VALVE                TRICUSPID VALVE MV Area (PHT): 2.62 cm     TR Peak grad:   32.7 mmHg MV Peak grad:  13.7 mmHg    TR Vmax:        286.00 cm/s MV Mean grad:  5.0 mmHg MV Vmax:       1.85 m/s     SHUNTS MV Vmean:      106.0 cm/s   Systemic VTI:  0.29 m MV Decel Time: 289 msec     Systemic Diam: 2.00 cm MV E velocity: 151.00 cm/s Dina RichJonathan Branch MD Electronically signed by Dina RichJonathan Branch MD Signature Date/Time: 10/12/2020/4:47:44 PM    Final         Discharge Exam: Vitals:   10/16/20 2118 10/17/20 0552  BP: (!) 99/55 (!) 109/51  Pulse: 70 76  Resp: 17 17  Temp: 98 F (36.7 C) 98.1 F (36.7 C)  SpO2: 99% 96%   Vitals:   10/16/20 0856 10/16/20 1457 10/16/20 2118 10/17/20 0552  BP: 138/85 104/60 (!) 99/55 (!) 109/51  Pulse: 85 70 70 76  Resp:  17 17 17   Temp:  97.8 F (36.6 C) 98 F (36.7 C) 98.1 F (36.7 C)  TempSrc:  Oral Oral Oral  SpO2:  99% 99% 96%  Weight:      Height:        General: Pt is alert, awake, not in acute distress Cardiovascular: RRR, S1/S2 +, no rubs, no gallops Respiratory: CTA bilaterally, no wheezing, no rhonchi Abdominal: Soft, NT, ND, bowel sounds + Extremities: no edema, no cyanosis   The results of significant diagnostics from this hospitalization (including imaging,  microbiology, ancillary and laboratory) are listed below for reference.    Significant Diagnostic Studies: DG Chest Portable 1 View  Result Date: 10/11/2020 CLINICAL DATA:  Swelling and shortness of breath. EXAM: PORTABLE CHEST 1 VIEW COMPARISON:  PA and lateral chest 09/14/2020. Single-view of the chest 08/07/2020. FINDINGS: Lungs are clear. Cardiomegaly. Aortic atherosclerosis. No pneumothorax or pleural fluid. IMPRESSION: No acute disease. Cardiomegaly. Aortic Atherosclerosis (ICD10-I70.0). Electronically Signed   By: Drusilla Kannerhomas  Dalessio M.D.   On: 10/11/2020 18:56   ECHOCARDIOGRAM COMPLETE  Result Date: 10/12/2020    ECHOCARDIOGRAM REPORT   Patient Name:   Jasmin Martin Date of Exam: 10/12/2020 Medical Rec #:  [id number]     Height:       64.0 in Accession #:    [id number]    Weight:  390.0 lb Date of Birth:  September 14, 1955     BSA:          2.598 m Patient Age:    89 years      BP:           105/52 mmHg Patient Gender: F             HR:           67 bpm. [facility] Procedure: 2D Echo, Cardiac Doppler and Color Doppler Indications:    Congestive Heart Failure I50.9  History:        Patient has prior history of Echocardiogram examinations, most                 recent 06/17/2014. CHF, Arrythmias:Atrial Fibrillation; Risk                 Factors:Hypertension. Long term current use of anticoagulant,                 Morbid Obesity, Palpitations.  Sonographer:    Mikki Harbor Referring Phys: [phone] Heloise Beecham EMOKPAE IMPRESSIONS  1. Left ventricular ejection fraction, by estimation, is 55 to 60%. The left ventricle has normal function. The left ventricle has no regional wall motion abnormalities. There is mild left ventricular hypertrophy. Left ventricular diastolic parameters are indeterminate.  2. Right ventricular systolic function is normal. The right ventricular size is mildly enlarged. There is mildly elevated pulmonary artery systolic pressure.  3. The pericardial effusion is  circumferential.  4. The mitral valve is abnormal. No evidence of mitral valve regurgitation. Mild mitral stenosis.  5. The tricuspid valve is abnormal. Tricuspid valve regurgitation is mild to moderate.  6. The aortic valve has an indeterminant number of cusps. There is moderate calcification of the aortic valve. There is moderate thickening of the aortic valve. Aortic valve regurgitation is not visualized. Mild aortic valve stenosis. Aortic valve mean gradient measures 13.0 mmHg. Aortic valve peak gradient measures 25.0 mmHg. Aortic valve area, by VTI measures 1.41 cm.  7. Moderate pulmonary HTN, PASP is 41 mmHg.  8. The inferior vena cava is dilated in size with >50% respiratory variability, suggesting right atrial pressure of 8 mmHg. FINDINGS  Left Ventricle: Left ventricular ejection fraction, by estimation, is 55 to 60%. The left ventricle has normal function. The left ventricle has no regional wall motion abnormalities. The left ventricular internal cavity size was normal in size. There is  mild left ventricular hypertrophy. Left ventricular diastolic parameters are indeterminate. Right Ventricle: The ventricular septum is flattened in systole suggesting RV pressure overload. The right ventricular size is mildly enlarged. No increase in right ventricular wall thickness. Right ventricular systolic function is normal. There is mildly elevated pulmonary artery systolic pressure. The tricuspid regurgitant velocity is 2.86 m/s, and with an assumed right atrial pressure of 8 mmHg, the estimated right ventricular systolic pressure is 40.7 mmHg. Left Atrium: Left atrial size was normal in size. Right Atrium: Right atrial size was normal in size. Pericardium: Trivial pericardial effusion is present. The pericardial effusion is circumferential. Mitral Valve: The mitral valve is abnormal. There is moderate thickening of the mitral valve leaflet(s). There is moderate calcification of the mitral valve leaflet(s). Mild to  moderate mitral annular calcification. No evidence of mitral valve regurgitation. Mild mitral valve stenosis. MV peak gradient, 13.7 mmHg. The mean mitral valve gradient is 5.0 mmHg. Tricuspid Valve: The tricuspid valve is abnormal. Tricuspid valve regurgitation is mild to moderate. No evidence  of tricuspid stenosis. Aortic Valve: The aortic valve has an indeterminant number of cusps. There is moderate calcification of the aortic valve. There is moderate thickening of the aortic valve. There is moderate aortic valve annular calcification. Aortic valve regurgitation is not visualized. Mild aortic stenosis is present. Aortic valve mean gradient measures 13.0 mmHg. Aortic valve peak gradient measures 25.0 mmHg. Aortic valve area, by VTI measures 1.41 cm. Pulmonic Valve: The pulmonic valve was not well visualized. Pulmonic valve regurgitation is not visualized. No evidence of pulmonic stenosis. Aorta: The aortic root is normal in size and structure. Pulmonary Artery: Moderate pulmonary HTN, PASP is 41 mmHg. Venous: The inferior vena cava is dilated in size with greater than 50% respiratory variability, suggesting right atrial pressure of 8 mmHg. IAS/Shunts: No atrial level shunt detected by color flow Doppler.  LEFT VENTRICLE PLAX 2D LVIDd:         5.00 cm  Diastology LVIDs:         3.29 cm  LV e' medial:    10.70 cm/s LV PW:         1.22 cm  LV E/e' medial:  14.1 LV IVS:        1.03 cm  LV e' lateral:   11.10 cm/s LVOT diam:     2.00 cm  LV E/e' lateral: 13.6 LV SV:         90 LV SV Index:   35 LVOT Area:     3.14 cm  RIGHT VENTRICLE RV Basal diam:  4.86 cm RV Mid diam:    3.84 cm RV S prime:     9.37 cm/s TAPSE (M-mode): 2.7 cm LEFT ATRIUM             Index       RIGHT ATRIUM           Index LA diam:        4.70 cm 1.81 cm/m  RA Area:     25.90 cm LA Vol (A2C):   78.4 ml 30.17 ml/m RA Volume:   84.50 ml  32.52 ml/m LA Vol (A4C):   71.5 ml 27.52 ml/m LA Biplane Vol: 75.1 ml 28.90 ml/m  AORTIC VALVE AV Area  (Vmax):    1.63 cm AV Area (Vmean):   1.59 cm AV Area (VTI):     1.41 cm AV Vmax:           250.00 cm/s AV Vmean:          170.000 cm/s AV VTI:            0.640 m AV Peak Grad:      25.0 mmHg AV Mean Grad:      13.0 mmHg LVOT Vmax:         129.82 cm/s LVOT Vmean:        85.786 cm/s LVOT VTI:          0.287 m LVOT/AV VTI ratio: 0.45  AORTA Ao Root diam: 2.80 cm MITRAL VALVE                TRICUSPID VALVE MV Area (PHT): 2.62 cm     TR Peak grad:   32.7 mmHg MV Peak grad:  13.7 mmHg    TR Vmax:        286.00 cm/s MV Mean grad:  5.0 mmHg MV Vmax:       1.85 m/s     SHUNTS MV Vmean:      106.0 cm/s   Systemic VTI:  0.29 m MV Decel Time: 289 msec     Systemic Diam: 2.00 cm MV E velocity: 151.00 cm/s Dina Rich MD Electronically signed by Dina Rich MD Signature Date/Time: 10/12/2020/4:47:44 PM    Final     Microbiology: Recent Results (from the past 240 hour(s))  SARS CORONAVIRUS 2 (Rowan Pollman 6-24 HRS) Nasopharyngeal Nasopharyngeal Swab     Status: None   Collection Time: 10/11/20  5:20 PM   Specimen: Nasopharyngeal Swab  Result Value Ref Range Status   SARS Coronavirus 2 NEGATIVE NEGATIVE Final    Comment: (NOTE) SARS-CoV-2 target nucleic acids are NOT DETECTED.  The SARS-CoV-2 RNA is generally detectable in upper and lower respiratory specimens during the acute phase of infection. Negative results do not preclude SARS-CoV-2 infection, do not rule out co-infections with other pathogens, and should not be used as the sole basis for treatment or other patient management decisions. Negative results must be combined with clinical observations, patient history, and epidemiological information. The expected result is Negative.  Fact Sheet for Patients: HairSlick.no  Fact Sheet for Healthcare Providers: quierodirigir.com  This test is not yet approved or cleared by the [place] FDA and  has been authorized for detection and/or diagnosis  of SARS-CoV-2 by FDA under an Emergency Use Authorization (EUA). This EUA will remain  in effect (meaning this test can be used) for the duration of the COVID-19 declaration under Se ction 564(b)(1) of the Act, 21 U.S.C. section 360bbb-3(b)(1), unless the authorization is terminated or revoked sooner.  Performed at [facility], [address], [place], [place] [address]      Labs: Basic Metabolic Panel: Recent Labs  Lab 10/11/20 1719 10/12/20 0418 10/13/20 0528 10/14/20 0548 10/15/20 0551 10/16/20 0514 10/17/20 0610  NA 140 141 142 140 140 138 139  K 4.0 3.2* 3.6 3.7 3.3* 2.9* 3.3*  CL 101 99 100 97* 95* 93* 93*  CO2 30 27 34* 34* 35* 35* 35*  GLUCOSE 139* 127* 131* 122* 137* 144* 150*  BUN 12 11 14 13 12 11 10   CREATININE 0.76 0.66 0.80 0.75 0.69 0.72 0.66  CALCIUM 8.3* 8.3* 8.1* 8.0* 8.0* 8.0* 8.0*  MG 1.5* 1.8  --   --  1.7 1.7 2.0   Liver Function Tests: No results for input(s): AST, ALT, ALKPHOS, BILITOT, PROT, ALBUMIN in the last 168 hours. No results for input(s): LIPASE, AMYLASE in the last 168 hours. No results for input(s): AMMONIA in the last 168 hours. CBC: Recent Labs  Lab 10/11/20 1719 10/12/20 0418 10/15/20 0551 10/16/20 0514  WBC 6.5 6.4 6.6 5.9  HGB 12.3 11.5* 11.8* 12.4  HCT 38.2 35.8* 36.1 37.6  MCV 104.9* 104.1* 104.9* 103.6*  PLT 250 219 219 220   Cardiac Enzymes: No results for input(s): CKTOTAL, CKMB, CKMBINDEX, TROPONINI in the last 168 hours. BNP: Invalid input(s): POCBNP CBG: Recent Labs  Lab 10/16/20 0734 10/16/20 1207 10/16/20 1635 10/16/20 2116 10/17/20 0731  GLUCAP 135* 152* 120* 119* 133*    Time coordinating discharge:  36 minutes  Signed:  10/19/20, DO [facility] Pager: [phone] 10/17/2020, 10:45 AM

## 2020-10-17 NOTE — Care Management Important Message (Signed)
Important Message  Patient Details  Name: Jasmin Martin MRN: [id number] Date of Birth: 28-Dec-1955   Medicare Important Message Given:  Yes     Corey Harold 10/17/2020, 12:17 PM

## 2020-10-17 NOTE — Progress Notes (Signed)
Progress Note  Patient Name: Jasmin Martin Date of Encounter: 10/17/2020  Primary Cardiologist: Dina Rich, MD  Subjective   Very much wants to go home. States foot swelling much better.  Inpatient Medications    Scheduled Meds:  apixaban  5 mg Oral BID   diltiazem  300 mg Oral Daily   furosemide  80 mg Intravenous BID   insulin aspart  0-5 Units Subcutaneous QHS   insulin aspart  0-9 Units Subcutaneous TID WC   metoprolol  200 mg Oral Daily   potassium chloride  40 mEq Oral Daily   pravastatin  40 mg Oral Daily    PRN Meds: acetaminophen **OR** acetaminophen, diphenhydrAMINE-zinc acetate, ipratropium-albuterol, loperamide, menthol-cetylpyridinium, ondansetron **OR** ondansetron (ZOFRAN) IV, oxyCODONE-acetaminophen **AND** oxyCODONE, polyethylene glycol   Vital Signs    Vitals:   10/16/20 0856 10/16/20 1457 10/16/20 2118 10/17/20 0552  BP: 138/85 104/60 (!) 99/55 (!) 109/51  Pulse: 85 70 70 76  Resp:  17 17 17   Temp:  97.8 F (36.6 C) 98 F (36.7 C) 98.1 F (36.7 C)  TempSrc:  Oral Oral Oral  SpO2:  99% 99% 96%  Weight:      Height:        Intake/Output Summary (Last 24 hours) at 10/17/2020 0803 Last data filed at 10/17/2020 0300 Gross per 24 hour  Intake 1060 ml  Output 1800 ml  Net -740 ml   Filed Weights   10/15/20 0541 10/16/20 0500 10/16/20 0854  Weight: (!) 175.4 kg (!) 175.3 kg (!) 172.7 kg    Telemetry    Rate controlled atrial fibrillation. Personally reviewed.  ECG    No ECG reviewed.  Physical Exam   GEN: Morbidly obese. No acute distress.   Neck: No JVD. Cardiac: Irregularly irregular, no gallop.  Respiratory: Nonlabored. Diminished breath sounds.. GI: Obese, bowel sounds present. MS: Foot and lower leg edema resolved..  Labs    Chemistry Recent Labs  Lab 10/15/20 0551 10/16/20 0514 10/17/20 0610  NA 140 138 139  K 3.3* 2.9* 3.3*  CL 95* 93* 93*  CO2 35* 35* 35*  GLUCOSE 137* 144* 150*  BUN 12 11 10   CREATININE  0.69 0.72 0.66  CALCIUM 8.0* 8.0* 8.0*  GFRNONAA >60 >60 >60  ANIONGAP 10 10 11      Hematology Recent Labs  Lab 10/12/20 0418 10/15/20 0551 10/16/20 0514  WBC 6.4 6.6 5.9  RBC 3.44* 3.44* 3.63*  HGB 11.5* 11.8* 12.4  HCT 35.8* 36.1 37.6  MCV 104.1* 104.9* 103.6*  MCH 33.4 34.3* 34.2*  MCHC 32.1 32.7 33.0  RDW 14.1 14.0 13.9  PLT 219 219 220    Cardiac Enzymes Recent Labs  Lab 10/11/20 2217 10/11/20 2339  TROPONINIHS 11 12    BNP Recent Labs  Lab 10/11/20 1719  BNP 161.0*      Radiology    No results found.  Cardiac Studies   Echocardiogram 10/12/2020:  1. Left ventricular ejection fraction, by estimation, is 55 to 60%. The  left ventricle has normal function. The left ventricle has no regional  wall motion abnormalities. There is mild left ventricular hypertrophy.  Left ventricular diastolic parameters  are indeterminate.   2. Right ventricular systolic function is normal. The right ventricular  size is mildly enlarged. There is mildly elevated pulmonary artery  systolic pressure.   3. The pericardial effusion is circumferential.   4. The mitral valve is abnormal. No evidence of mitral valve  regurgitation. Mild mitral stenosis.   5. The  tricuspid valve is abnormal. Tricuspid valve regurgitation is mild  to moderate.   6. The aortic valve has an indeterminant number of cusps. There is  moderate calcification of the aortic valve. There is moderate thickening  of the aortic valve. Aortic valve regurgitation is not visualized. Mild  aortic valve stenosis. Aortic valve mean  gradient measures 13.0 mmHg. Aortic valve peak gradient measures 25.0  mmHg. Aortic valve area, by VTI measures 1.41 cm.   7. Moderate pulmonary HTN, PASP is 41 mmHg.   8. The inferior vena cava is dilated in size with >50% respiratory  variability, suggesting right atrial pressure of 8 mmHg.   Patient Profile     65 y.o. female with a history of chronic diastolic heart failure,  chronic hypoxic respiratory failure in the setting of COPD, hypertension, and atrial fibrillation now presenting with acute on chronic diastolic heart failure and approximately 20 lb weight gain.  Assessment & Plan    1. Acute on chronic diastolic heart failure, RV contraction normal. Follow-up echocardiogram shows LVEF 55-60%. She has diuresed on IV Lasix with 10 lb weight loss to this point. Had not been compliant with regular outpatient diuretics.  2. Permanent atrial fibrillation, CHADS2VASC score of 4. Heart rate is controlled on Cardizem CD and Toprol XL. She continues on Eliquis for stroke prophylaxis.  3. Essential hypertension, low normal to normal on current regimen. Lisinopril held.  4. Mild calcific aortic stenosis, asymptomatic.  5. COPD with chronic hypoxic respiratory failure on oxygen.  6. Mixed hyperlipidemia, on pravachol.  Patient would like to go home today, feels better overall. She still has further fluid to diurese and will modify outpatient regimen with OP follow-up arranged.  [facility] will sign off.   Medication Recommendations:  Demadex 60 mg daily, KCL 20 mEq daily, Toprol XL 200 mg daily, Cardizem CD 300 mg daily, Eliquis 5 mg BID. Do not resume Lisinopril. Other recommendations (labs, testing, etc):  BMET at time of OP follow-up. Follow up as an outpatient:  7-10 days (will arrange).  Signed, Nona Dell, MD  10/17/2020, 8:03 AM

## 2020-10-19 LAB — GLUCOSE, CAPILLARY: Glucose-Capillary: 149 mg/dL — ABNORMAL HIGH (ref 70–99)

## 2020-11-01 ENCOUNTER — Other Ambulatory Visit: Payer: Self-pay | Admitting: Cardiology

## 2020-11-02 ENCOUNTER — Encounter: Payer: Self-pay | Admitting: Cardiology

## 2020-11-02 ENCOUNTER — Ambulatory Visit: Payer: Medicare Other | Admitting: Cardiology

## 2020-11-02 ENCOUNTER — Other Ambulatory Visit: Payer: Self-pay

## 2020-11-02 VITALS — BP 130/80 | HR 77 | Resp 22 | Wt 361.8 lb

## 2020-11-02 DIAGNOSIS — Z79899 Other long term (current) drug therapy: Secondary | ICD-10-CM | POA: Diagnosis not present

## 2020-11-02 DIAGNOSIS — I5032 Chronic diastolic (congestive) heart failure: Secondary | ICD-10-CM | POA: Diagnosis not present

## 2020-11-02 DIAGNOSIS — G4730 Sleep apnea, unspecified: Secondary | ICD-10-CM | POA: Diagnosis not present

## 2020-11-02 DIAGNOSIS — I4891 Unspecified atrial fibrillation: Secondary | ICD-10-CM

## 2020-11-02 NOTE — Patient Instructions (Addendum)
Medication Instructions:  Your physician recommends that you continue on your current medications as directed. Please refer to the Current Medication list given to you today.  Labwork: [facility] [employer] [facility], [facility] or [facility] Non fasting  Testing/Procedures: none  Follow-Up: Your physician recommends that you schedule a follow-up appointment in: 2 months  Any Other Special Instructions Will Be Listed Below (If Applicable). You have been referred to [facility]  If you need a refill on your cardiac medications before your next appointment, please call your pharmacy.

## 2020-11-02 NOTE — Progress Notes (Signed)
Clinical Summary Jasmin Martin is a 65 y.o.female seen today for follow up of the following medical problem.s   1. Afib - no symptoms, compliant with meds    2. HTN - she is compliant with meds   3. Nocturnal hypoxia - uses home O2 at night, reports negative sleep study for OSA several years ago.  - +snoring, no apneic episodes, +daytime somnolence     4. Chronic diastolic HF - admit 09/2020 with acute on chronic diastolic HF - - 09/2020 LVE 55-60%, indet diastolic function, normal RV - BNP 161, CXR no acute process. Reported 20 lbs weight gain over 4 months - wt 177 kg on admission(389 lbs), was 372 lbs in 05/2020  -discharge weight unclear - today weight is 361 lbs - mild swelling in legs - breathing is much better.    5. Mild aortic stenosis - Aortic valve mean gradient measures 13.0 mmHg. Aortic valve peak gradient measures 25.0  mmHg. Aortic valve area, by VTI measures 1.41 cm.   6. Chronic respiraotory failure - on home O2 2L    SH: son passed away 02/16/2017   Past Medical History:  Diagnosis Date   Arthritis    Atrial fibrillation (HCC)    a. on Eliquis   Bilateral knee pain    CHF (congestive heart failure) (HCC)    Depression    DJD (degenerative joint disease)    HTN (hypertension)    Insomnia    Obesity, morbid (HCC)    Persistent atrial fibrillation (HCC)    Renal insufficiency      No Known Allergies   Current Outpatient Medications  Medication Sig Dispense Refill   albuterol (VENTOLIN HFA) 108 (90 Base) MCG/ACT inhaler Inhale 2 puffs into the lungs every 6 (six) hours as needed for wheezing or shortness of breath.     apixaban (ELIQUIS) 5 MG TABS tablet Take 5 mg by mouth 2 (two) times daily.     diltiazem (CARDIZEM CD) 300 MG 24 hr capsule TAKE ONE CAPSULE BY MOUTH ONCE DAILY. 90 capsule 0   hydrOXYzine (ATARAX/VISTARIL) 25 MG tablet Take 25 mg by mouth every 8 (eight) hours as needed.     metFORMIN (GLUCOPHAGE) 500 MG tablet Take 500  mg by mouth every morning.     metoprolol (TOPROL-XL) 200 MG 24 hr tablet TAKE (1) TABLET BY MOUTH ONCE DAILY. 90 tablet 3   oxyCODONE-acetaminophen (PERCOCET) 10-325 MG per tablet Take 1 tablet by mouth every 6 (six) hours.     OXYGEN Inhale 2 L into the lungs daily.     potassium chloride SA (KLOR-CON) 20 MEQ tablet Take 1 tablet (20 mEq total) by mouth daily. 30 tablet 1   pravastatin (PRAVACHOL) 40 MG tablet Take 40 mg by mouth daily.     torsemide 60 MG TABS Take 60 mg by mouth daily. 90 tablet 1   No current facility-administered medications for this visit.     Past Surgical History:  Procedure Laterality Date   ABDOMINAL HYSTERECTOMY     bilateral knee surgery     CESAREAN SECTION     CHOLECYSTECTOMY  2001   COLONOSCOPY  2007   Dr. Jena Gauss: friable anal canal, few scattered left-sided diverticula, benign colonic biopsies    right ankle surgery     TONSILLECTOMY     VESICOVAGINAL FISTULA CLOSURE W/ TAH       No Known Allergies    Family History  Problem Relation Age of Onset   Cancer Father  Lung    CVA Son    Colon cancer Neg Hx      Social History Ms. Toulouse reports that she has never smoked. She has never used smokeless tobacco. Ms. Hairston reports no history of alcohol use.   Review of Systems CONSTITUTIONAL: No weight loss, fever, chills, weakness or fatigue.  HEENT: Eyes: No visual loss, blurred vision, double vision or yellow sclerae.No hearing loss, sneezing, congestion, runny nose or sore throat.  SKIN: No rash or itching.  CARDIOVASCULAR: per hpi RESPIRATORY:per hpi GASTROINTESTINAL: No anorexia, nausea, vomiting or diarrhea. No abdominal pain or blood.  GENITOURINARY: No burning on urination, no polyuria NEUROLOGICAL: No headache, dizziness, syncope, paralysis, ataxia, numbness or tingling in the extremities. No change in bowel or bladder control.  MUSCULOSKELETAL: No muscle, back pain, joint pain or stiffness.  LYMPHATICS: No enlarged nodes.  No history of splenectomy.  PSYCHIATRIC: No history of depression or anxiety.  ENDOCRINOLOGIC: No reports of sweating, cold or heat intolerance. No polyuria or polydipsia.  Marland Kitchen   Physical Examination Today's Vitals   11/02/20 1539  BP: 130/80  Pulse: 77  Resp: (!) 22  SpO2: 98%  Weight: (!) 361 lb 12.8 oz (164.1 kg)  PainSc: 0-No pain   Body mass index is 62.1 kg/m.  Gen: resting comfortably, no acute distress HEENT: no scleral icterus, pupils equal round and reactive, no palptable cervical adenopathy,  CV: irreg, 2/6 systolic murmur rusb, no jvd Resp: Clear to auscultation bilaterally GI: abdomen is soft, non-tender, non-distended, normal bowel sounds, no hepatosplenomegaly MSK: extremities are warm, no edema.  Skin: warm, no rash Neuro:  no focal deficits Psych: appropriate affect   Diagnostic Studies 07/14/11 Echo: LVEF 50-55%, mild LVH, mod LAE   06/2014 MPI IMPRESSION: 1. No reversible ischemia or infarction. Hypertensive response to stress.   2. Normal left ventricular wall motion.   3. Left ventricular ejection fraction 30%. However, this is likely due to gating abnormalities as echocardiogram on 06/17/14 reported normal LV systolic function, LVEF 55-60%. Function appears grossly normal on nuclear images.   4. Low-risk stress test findings*.   06/2014 echo Study Conclusions  - Left ventricle: Systolic function was normal. The estimated   ejection fraction was in the range of 55% to 60%. - Aortic valve: Mildly calcified annulus. Trileaflet; mildly   thickened leaflets. Valve area (VTI): 1.52 cm^2. - Mitral valve: Mildly calcified annulus. - Left atrium: The atrium was severely dilated. - Right ventricle: The cavity size was mildly dilated. - Right atrium: The atrium was severely dilated. - Atrial septum: There was a patent foramen ovale. - Technically adequate study.    Assessment and Plan   1. Afib - no symptoms, continue current meds   2. Chronic  diastolic HF - weight down about 30 lbs from when she was recently admitted - she is on toresmide 60mg  daily, recheck bmet/mg - symptoms wise doing well  3. Nocturnal hypoxia - reports sleep study long time ago was told just needs nocturnal oxygen - +snoring, daytime somnolence. Significant hsitory of afib, diastolic HF - will refer to pulmonary for repeat evaluation       , M.D.

## 2020-11-04 ENCOUNTER — Other Ambulatory Visit: Payer: Self-pay | Admitting: Cardiology

## 2020-11-05 LAB — SPECIMEN STATUS REPORT

## 2020-11-05 LAB — BASIC METABOLIC PANEL
BUN/Creatinine Ratio: 16 (ref 12–28)
BUN: 18 mg/dL (ref 8–27)
CO2: 30 mmol/L — ABNORMAL HIGH (ref 20–29)
Calcium: 9.1 mg/dL (ref 8.7–10.3)
Chloride: 93 mmol/L — ABNORMAL LOW (ref 96–106)
Creatinine, Ser: 1.14 mg/dL — ABNORMAL HIGH (ref 0.57–1.00)
Glucose: 158 mg/dL — ABNORMAL HIGH (ref 65–99)
Potassium: 4.4 mmol/L (ref 3.5–5.2)
Sodium: 142 mmol/L (ref 134–144)
eGFR: 53 mL/min/{1.73_m2} — ABNORMAL LOW (ref >59)

## 2020-11-05 LAB — MAGNESIUM: Magnesium: 1.6 mg/dL (ref 1.6–2.3)

## 2020-11-11 ENCOUNTER — Encounter (HOSPITAL_COMMUNITY): Payer: Self-pay

## 2020-11-11 ENCOUNTER — Emergency Department (HOSPITAL_COMMUNITY)
Admission: EM | Admit: 2020-11-11 | Discharge: 2020-11-11 | Disposition: A | Discharge status: Home / Self Care | Payer: Medicare Other | Attending: Emergency Medicine | Admitting: Emergency Medicine

## 2020-11-11 ENCOUNTER — Other Ambulatory Visit: Payer: Self-pay

## 2020-11-11 ENCOUNTER — Emergency Department (HOSPITAL_COMMUNITY): Payer: Medicare Other

## 2020-11-11 DIAGNOSIS — Z7901 Long term (current) use of anticoagulants: Secondary | ICD-10-CM | POA: Insufficient documentation

## 2020-11-11 DIAGNOSIS — I5033 Acute on chronic diastolic (congestive) heart failure: Secondary | ICD-10-CM | POA: Diagnosis not present

## 2020-11-11 DIAGNOSIS — Z7984 Long term (current) use of oral hypoglycemic drugs: Secondary | ICD-10-CM | POA: Diagnosis not present

## 2020-11-11 DIAGNOSIS — I110 Hypertensive heart disease with heart failure: Secondary | ICD-10-CM | POA: Diagnosis not present

## 2020-11-11 DIAGNOSIS — I4819 Other persistent atrial fibrillation: Secondary | ICD-10-CM | POA: Insufficient documentation

## 2020-11-11 DIAGNOSIS — Z79899 Other long term (current) drug therapy: Secondary | ICD-10-CM | POA: Insufficient documentation

## 2020-11-11 DIAGNOSIS — S76012A Strain of muscle, fascia and tendon of left hip, initial encounter: Secondary | ICD-10-CM | POA: Diagnosis not present

## 2020-11-11 DIAGNOSIS — S79912A Unspecified injury of left hip, initial encounter: Secondary | ICD-10-CM | POA: Diagnosis present

## 2020-11-11 DIAGNOSIS — Z20822 Contact with and (suspected) exposure to covid-19: Secondary | ICD-10-CM | POA: Diagnosis not present

## 2020-11-11 DIAGNOSIS — S76019A Strain of muscle, fascia and tendon of unspecified hip, initial encounter: Secondary | ICD-10-CM

## 2020-11-11 DIAGNOSIS — X58XXXA Exposure to other specified factors, initial encounter: Secondary | ICD-10-CM | POA: Diagnosis not present

## 2020-11-11 LAB — BASIC METABOLIC PANEL
Anion gap: 13 (ref 5–15)
BUN: 23 mg/dL (ref 8–23)
CO2: 32 mmol/L (ref 22–32)
Calcium: 8.7 mg/dL — ABNORMAL LOW (ref 8.9–10.3)
Chloride: 93 mmol/L — ABNORMAL LOW (ref 98–111)
Creatinine, Ser: 0.95 mg/dL (ref 0.44–1.00)
GFR, Estimated: >60 mL/min (ref >60)
Glucose, Bld: 179 mg/dL — ABNORMAL HIGH (ref 70–99)
Potassium: 2.9 mmol/L — ABNORMAL LOW (ref 3.5–5.1)
Sodium: 138 mmol/L (ref 135–145)

## 2020-11-11 LAB — CBC WITH DIFFERENTIAL/PLATELET
Abs Immature Granulocytes: 0.03 10*3/uL (ref 0.00–0.07)
Basophils Absolute: 0.1 10*3/uL (ref 0.0–0.1)
Basophils Relative: 1 %
Eosinophils Absolute: 0.2 10*3/uL (ref 0.0–0.5)
Eosinophils Relative: 3 %
HCT: 38.9 % (ref 36.0–46.0)
Hemoglobin: 13.1 g/dL (ref 12.0–15.0)
Immature Granulocytes: 0 %
Lymphocytes Relative: 26 %
Lymphs Abs: 1.7 10*3/uL (ref 0.7–4.0)
MCH: 33.7 pg (ref 26.0–34.0)
MCHC: 33.7 g/dL (ref 30.0–36.0)
MCV: 100 fL (ref 80.0–100.0)
Monocytes Absolute: 0.8 10*3/uL (ref 0.1–1.0)
Monocytes Relative: 11 %
Neutro Abs: 3.9 10*3/uL (ref 1.7–7.7)
Neutrophils Relative %: 59 %
Platelets: 237 10*3/uL (ref 150–400)
RBC: 3.89 MIL/uL (ref 3.87–5.11)
RDW: 12.9 % (ref 11.5–15.5)
WBC: 6.7 10*3/uL (ref 4.0–10.5)
nRBC: 0 % (ref 0.0–0.2)

## 2020-11-11 LAB — PROTIME-INR
INR: 1.4 — ABNORMAL HIGH (ref 0.8–1.2)
Prothrombin Time: 17.5 seconds — ABNORMAL HIGH (ref 11.4–15.2)

## 2020-11-11 LAB — TYPE AND SCREEN
ABO/RH(D): A POS
Antibody Screen: NEGATIVE

## 2020-11-11 LAB — RESP PANEL BY RT-PCR (FLU A&B, COVID) ARPGX2
Influenza A by PCR: NEGATIVE
Influenza B by PCR: NEGATIVE
SARS Coronavirus 2 by RT PCR: NEGATIVE

## 2020-11-11 LAB — ABO/RH: ABO/RH(D): A POS

## 2020-11-11 MED ORDER — FENTANYL CITRATE PF 50 MCG/ML IJ SOSY: 50.0000 ug | PREFILLED_SYRINGE | INTRAMUSCULAR | Status: DC | PRN

## 2020-11-11 MED ORDER — DEXAMETHASONE SODIUM PHOSPHATE 10 MG/ML IJ SOLN
10.0000 mg | Freq: Once | INTRAMUSCULAR | Status: AC
  Administered 2020-11-11: 10 mg via INTRAVENOUS
  Filled 2020-11-11: qty 1

## 2020-11-11 MED ORDER — METHYLPREDNISOLONE 4 MG PO TBPK
ORAL_TABLET | ORAL | 0 refills | Status: DC
Start: 2020-11-11 — End: 2020-12-28

## 2020-11-11 NOTE — ED Triage Notes (Signed)
BIB EMS c/o L hip pain after walking. Per EMS, + deformity and rotation.

## 2020-11-11 NOTE — ED Notes (Addendum)
Pt has stress fxs in right foot and was walking to bathroom when she heard a loud pop sound and felt pain in her Left hip. No fall. Pedal pulses present bilaterally, feet warm and dry to touch. Left leg slightly shortened, possible outward rotation. Mild edema to right foot. BLE cap refill <3 seconds.

## 2020-11-11 NOTE — Discharge Instructions (Addendum)
Your x-ray shows no signs of broken bones, you likely have a strain around your hip You may take your Percocet at home as needed for pain Please take the Medrol Dosepak as well as this may help reduce the inflammation I have given you a prescription for a wheelchair and a bedside commode, please get these filled and use them starting immediately Emergency department for severe worsening symptoms   Thank you for letting us take care of you today!  Please obtain all of your results from medical records or have your doctors office obtain the results - share them with your doctor - you should be seen at your doctors office in the next 2 days. Call today to arrange your follow up. Take the medications as prescribed. Please review all of the medicines and only take them if you do not have an allergy to them. Please be aware that if you are taking birth control pills, taking other prescriptions, ESPECIALLY ANTIBIOTICS may make the birth control ineffective - if this is the case, either do not engage in sexual activity or use alternative methods of birth control such as condoms until you have finished the medicine and your family doctor says it is OK to restart them. If you are on a blood thinner such as COUMADIN, be aware that any other medicine that you take may cause the coumadin to either work too much, or not enough - you should have your coumadin level rechecked in next 7 days if this is the case.  ?  It is also a possibility that you have an allergic reaction to any of the medicines that you have been prescribed - Everybody reacts differently to medications and while MOST people have no trouble with most medicines, you may have a reaction such as nausea, vomiting, rash, swelling, shortness of breath. If this is the case, please stop taking the medicine immediately and contact your physician.   If you were given a medication in the ED such as percocet, vicodin, or morphine, be aware that these medicines  are sedating and may change your ability to take care of yourself adequately for several hours after being given this medicines - you should not drive or take care of small children if you were given this medicine in the Emergency Department or if you have been prescribed these types of medicines. ?   You should return to the ER IMMEDIATELY if you develop severe or worsening symptoms.

## 2020-11-11 NOTE — ED Provider Notes (Signed)
[facility] EMERGENCY DEPARTMENT Provider Note   CSN: [id number] Arrival date & time: 11/11/20  1624     History Chief Complaint  Patient presents with   Hip Pain    Jasmin Martin is a 65 y.o. female.   Hip Pain   This patient is a 65 year old female, she has a history of congestive heart failure hypertension, she is morbidly obese with a body mass index of 62, she lives by herself and states that while she was ambulating this morning at around 6:30 in the morning she was favoring her right foot because of a stress fracture in the foot, she was noticing that she was a little weighing more heavily on her left leg.  Later in the day approximately 2 hours ago while she was ambulating she felt an acute onset of pain in the left hip which caused her to not be able to bear any weight on the leg.  She called for paramedics.  She felt a pop and a crack and felt like her leg did not look the same, the paramedics felt like there was a leg length discrepancy.  They transported the patient with a possible hip fracture.  The patient denies a fall or specific injury, she is on Eliquis and took her last dose this morning for atrial fibrillation.  Pain is constant, worse with range of motion, not associated with numbness.  Past Medical History:  Diagnosis Date   Arthritis    Atrial fibrillation (HCC)    a. on Eliquis   Bilateral knee pain    CHF (congestive heart failure) (HCC)    Depression    DJD (degenerative joint disease)    HTN (hypertension)    Insomnia    Obesity, morbid (HCC)    Persistent atrial fibrillation (HCC)    Renal insufficiency     Patient Active Problem List   Diagnosis Date Noted   Acute on chronic diastolic CHF (congestive heart failure) (HCC) 10/11/2020   Elevated LFTs 07/22/2015   Chest pain 06/16/2014   Hypotension 06/16/2014   Acute kidney injury (HCC) 06/16/2014   Encounter for therapeutic drug monitoring 04/27/2013   OA (osteoarthritis) of knee 05/22/2012    Neuropathy 05/22/2012   Diastolic CHF, chronic (HCC) 07/31/2011   Weakness of both legs 05/22/2011   Long term current use of anticoagulant 06/30/2010   TENOSYNOVITIS OF FOOT AND ANKLE 03/01/2010   STRESS FRACTURE, FOOT 03/01/2010   MUSCLE CRAMPS 10/18/2009   Morbid obesity (HCC) 08/12/2009   Anxiety state 07/18/2009   DYSPNEA 07/18/2009   PALPITATIONS 06/15/2009   Hypertension 06/02/2009   HYPOKALEMIA 05/31/2009   FIBRILLATION, ATRIAL 05/31/2009   PNEUMONIA 05/31/2009   INFLUENZA 05/31/2009    Past Surgical History:  Procedure Laterality Date   ABDOMINAL HYSTERECTOMY     bilateral knee surgery     CESAREAN SECTION     CHOLECYSTECTOMY  2001   COLONOSCOPY  2007   Dr. Jena Gauss: friable anal canal, few scattered left-sided diverticula, benign colonic biopsies    right ankle surgery     TONSILLECTOMY     VESICOVAGINAL FISTULA CLOSURE W/ TAH       OB History   No obstetric history on file.     Family History  Problem Relation Age of Onset   Cancer Father        Lung    CVA Son    Colon cancer Neg Hx     Social History   Tobacco Use   Smoking status: Never  Smokeless tobacco: Never   Tobacco comments:    Never smoked  Vaping Use   Vaping Use: Never used  Substance Use Topics   Alcohol use: No    Alcohol/week: 0.0 standard drinks   Drug use: No    Home Medications Prior to Admission medications   Medication Sig Start Date End Date Taking? Authorizing Provider  albuterol (VENTOLIN HFA) 108 (90 Base) MCG/ACT inhaler Inhale 2 puffs into the lungs every 6 (six) hours as needed for wheezing or shortness of breath.   Yes [provider]  apixaban (ELIQUIS) 5 MG TABS tablet Take 5 mg by mouth 2 (two) times daily.   Yes [provider]  diltiazem (CARDIZEM CD) 300 MG 24 hr capsule TAKE ONE CAPSULE BY MOUTH ONCE DAILY. 11/01/20  Yes Antoine PocheBranch, Jonathan F, MD  metFORMIN (GLUCOPHAGE) 500 MG tablet Take 500 mg by mouth every morning. 10/06/20  Yes [provider]  methylPREDNISolone (MEDROL DOSEPAK) 4 MG TBPK tablet Taper over 6 days 11/11/20  Yes Eber HongMiller, Marguerette Sheller, MD  metoprolol (TOPROL-XL) 200 MG 24 hr tablet TAKE (1) TABLET BY MOUTH ONCE DAILY. 08/28/19  Yes Branch, Dorothe PeaJonathan F, MD  oxyCODONE-acetaminophen (PERCOCET) 10-325 MG per tablet Take 1 tablet by mouth every 6 (six) hours.   Yes [provider]  OXYGEN Inhale 2 L into the lungs daily.   Yes [provider]  potassium chloride SA (KLOR-CON) 20 MEQ tablet Take 1 tablet (20 mEq total) by mouth daily. 10/18/20  Yes Tat, Onalee Huaavid, MD  pravastatin (PRAVACHOL) 40 MG tablet Take 40 mg by mouth daily.   Yes [provider]  torsemide 60 MG TABS Take 60 mg by mouth daily. 10/18/20  Yes TatOnalee Hua, David, MD    Allergies    Patient has no known allergies.  Review of Systems   Review of Systems  All other systems reviewed and are negative.  Physical Exam Updated Vital Signs BP 120/65   Pulse 84   Temp 97.9 F (36.6 C) (Oral)   Resp 18   Ht 1.626 m (5\' 4" )   Wt (!) 164.1 kg   SpO2 99%   BMI 62.10 kg/m   Physical Exam Vitals and nursing note reviewed.  Constitutional:      General: She is not in acute distress.    Appearance: She is well-developed.  HENT:     Head: Normocephalic and atraumatic.     Mouth/Throat:     Pharynx: No oropharyngeal exudate.  Eyes:     General: No scleral icterus.       Right eye: No discharge.        Left eye: No discharge.     Conjunctiva/sclera: Conjunctivae normal.     Pupils: Pupils are equal, round, and reactive to light.  Neck:     Thyroid: No thyromegaly.     Vascular: No JVD.  Cardiovascular:     Rate and Rhythm: Normal rate and regular rhythm.     Heart sounds: Normal heart sounds. No murmur heard.   No friction rub. No gallop.  Pulmonary:     Effort: Pulmonary effort is normal. No respiratory distress.     Breath sounds: Normal breath sounds. No wheezing or rales.  Abdominal:     General: Bowel sounds are normal.  There is no distension.     Palpations: Abdomen is soft. There is no mass.     Tenderness: There is no abdominal tenderness.     Comments: Very obese, soft nontender abdomen  Musculoskeletal:  General: No tenderness. Normal range of motion.     Cervical back: Normal range of motion and neck supple.     Comments: The patient's range of motion is restricted at the left hip, she cannot internally or externally rotate or flex the hip without significant pain.  There is no obvious deformity but due to the patient's body habitus is difficult to tell whether there is a leg length discrepancy.  She does have tenderness over the lateral left hip over the trochanteric bursa.  There is no redness no swelling and normal pulses of the feet  Lymphadenopathy:     Cervical: No cervical adenopathy.  Skin:    General: Skin is warm and dry.     Findings: No erythema or rash.  Neurological:     Mental Status: She is alert.     Coordination: Coordination normal.     Comments: Normal strength and sensation bilaterally to the lower extremities, her range of motion and strength is restricted secondary to pain in the left hip  Psychiatric:        Behavior: Behavior normal.    ED Results / Procedures / Treatments   Labs (all labs ordered are listed, but only abnormal results are displayed) Labs Reviewed  BASIC METABOLIC PANEL - Abnormal; Notable for the following components:      Result Value   Potassium 2.9 (*)    Chloride 93 (*)    Glucose, Bld 179 (*)    Calcium 8.7 (*)    All other components within normal limits  PROTIME-INR - Abnormal; Notable for the following components:   Prothrombin Time 17.5 (*)    INR 1.4 (*)    All other components within normal limits  RESP PANEL BY RT-PCR (FLU A&B, COVID) ARPGX2  CBC WITH DIFFERENTIAL/PLATELET  TYPE AND SCREEN  ABO/RH    EKG None  Radiology DG Chest 1 View  Result Date: 11/11/2020 CLINICAL DATA:  Preop hip pain after fall EXAM: CHEST  1  VIEW COMPARISON:  10/11/2020 FINDINGS: Cardiomegaly with vascular congestion. Streaky atelectasis left base. No consolidation, pleural effusion, or pneumothorax. IMPRESSION: Cardiomegaly with vascular congestion. Subsegmental atelectasis left base Electronically Signed   By: Jasmine Pang M.D.   On: 11/11/2020 17:48   DG Hip Unilat With Pelvis 2-3 Views Left  Result Date: 11/11/2020 CLINICAL DATA:  Left hip pain unable to move EXAM: DG HIP (WITH OR WITHOUT PELVIS) 2-3V LEFT COMPARISON:  05/02/2011 FINDINGS: SI joints are non widened. Pubic symphysis and rami are intact. No fracture or malalignment IMPRESSION: No acute osseous abnormality Electronically Signed   By: Jasmine Pang M.D.   On: 11/11/2020 17:50    Procedures Procedures   Medications Ordered in ED Medications  fentaNYL (SUBLIMAZE) injection 50 mcg (has no administration in time range)  dexamethasone (DECADRON) injection 10 mg (has no administration in time range)    ED Course  I have reviewed the triage vital signs and the nursing notes.  Pertinent labs & imaging results that were available during my care of the patient were reviewed by me and considered in my medical decision making (see chart for details).    MDM Rules/Calculators/A&P                           Preop testing with x-rays of the hip as well as the chest, COVID, labs, will get some pain control, vital signs are rather unremarkable and she has no systemic symptoms whatsoever.  Risk factors for possible fracture include her significant body habitus and history of pathologic fractures.`  This patient has been informed of her results, her boyfriend is at the bedside and she is allowed me to discuss this with him as well.  The patient has what appears to be a likely strain around the left hip, there is no fractures and her lab work is unremarkable, the vital signs are totally normal with a blood pressure of 120/65.  Her pain is well controlled and she states that she  has Percocet 10 mg tablets at home.  She has seen Dr. Romeo Apple in the past and is comfortable following up with him but is concerned about her ability to get around the house.  I have offered her a wheelchair, she already has a walker, I will give her prescriptions for both a wheelchair and a bedside commode, the boyfriend is going to stay with her, she states that she will sleep in her recliner and follow-up in the outpatient setting which I think is reasonable.  I see no reason to admit the patient to the hospital, she will need to see orthopedics as an outpatient and she is agreeable to the plan  Final Clinical Impression(s) / ED Diagnoses Final diagnoses:  Hip strain, initial encounter    Rx / DC Orders ED Discharge Orders          Ordered    methylPREDNISolone (MEDROL DOSEPAK) 4 MG TBPK tablet        11/11/20 1927             Eber Hong, MD 11/11/20 1929

## 2020-11-12 ENCOUNTER — Telehealth (HOSPITAL_COMMUNITY): Payer: Self-pay | Admitting: Emergency Medicine

## 2020-12-12 ENCOUNTER — Ambulatory Visit: Payer: Medicare Other

## 2020-12-12 ENCOUNTER — Encounter: Payer: Self-pay | Admitting: Orthopedic Surgery

## 2020-12-12 ENCOUNTER — Other Ambulatory Visit: Payer: Self-pay

## 2020-12-12 ENCOUNTER — Ambulatory Visit: Payer: Medicare Other | Admitting: Orthopedic Surgery

## 2020-12-12 VITALS — Ht 64.0 in | Wt 367.0 lb

## 2020-12-12 DIAGNOSIS — M25511 Pain in right shoulder: Secondary | ICD-10-CM

## 2020-12-12 DIAGNOSIS — M545 Low back pain, unspecified: Secondary | ICD-10-CM

## 2020-12-12 NOTE — Progress Notes (Signed)
Chief Complaint  Patient presents with   Hip Pain    L/Having pain now for about a month/had xray at [facility] on 11/11/20. I am having pain in my right leg and foot.   65 year old female presented to the ER unable to ambulate she was simply turning to her left felt acute pain in her left hip and was unable to walk called EMS and went to the hospital x-rays were negative  She comes in today still using a walker having trouble with her gait and lower back pain  Secondary musculoskeletal complaints include right shoulder pain which started 3 days ago associated with decreased abduction and flexion of the arm and painful forward elevation no trauma  System review reveals patient has chronic foot pain from stress fractures presumably  She also has burning sensation in the right leg  Past Medical History:  Diagnosis Date   Arthritis    Atrial fibrillation (HCC)    a. on Eliquis   Bilateral knee pain    CHF (congestive heart failure) (HCC)    Depression    DJD (degenerative joint disease)    HTN (hypertension)    Insomnia    Obesity, morbid (HCC)    Persistent atrial fibrillation (HCC)    Renal insufficiency    .psh Past Surgical History:  Procedure Laterality Date   ABDOMINAL HYSTERECTOMY     bilateral knee surgery     CESAREAN SECTION     CHOLECYSTECTOMY  2001   COLONOSCOPY  2007   Dr. Jena Gauss: friable anal canal, few scattered left-sided diverticula, benign colonic biopsies    right ankle surgery     TONSILLECTOMY     VESICOVAGINAL FISTULA CLOSURE W/ TAH       Ht 5\' 4"  (1.626 m)   Wt (!) 367 lb (166.5 kg)   BMI 63.00 kg/m   Right shoulder abduction 90 flexion 90 active strength normal pain with abduction external rotation and flexion against resistance positive impingement sign  Right and left lower extremity normal range of motion both hips no pain normal abduction abduction rotation without pain  Tenderness lower back left side of her lower back and right side of her back  as well as midline straight leg raise is negative no reflex changes   Encounter Diagnoses  Name Primary?   Lumbar pain Yes   Acute pain of right shoulder    It seems to me that the patient complained of hip pain but it was actually back pain causing her to have such pain and difficulty ambulating  She is now ambulating back to her baseline status with a walker still has lower back pain  The pain is much improved she can now ambulate her hip exam was normal  Recommend therapy as noted below  Her right shoulder 3 days of pain sounds like bursitis no history of trauma doubt rotator cuff tear still has active forward elevation and abduction  Subacromial injection performed Recommend home therapy due to the fact the patient is not able to ambulate or drive and still using a walker  Subacromial injection right shoulder  A steroid injection was performed at right subacromial space using 1% plain Lidocaine and 6 mg of Celestone. This was well tolerated.   No follow-up

## 2020-12-20 ENCOUNTER — Telehealth: Payer: Self-pay | Admitting: Orthopedic Surgery

## 2020-12-20 NOTE — Telephone Encounter (Signed)
Gave verbal orders told her to let us know when we need to transition to outpatient.

## 2020-12-20 NOTE — Telephone Encounter (Signed)
Maria from [facility] called for orders on this patient.  Please call Byrd Hesselbach at [phone]

## 2020-12-21 ENCOUNTER — Other Ambulatory Visit: Payer: Self-pay | Admitting: Cardiology

## 2020-12-23 ENCOUNTER — Encounter: Payer: Self-pay | Admitting: *Deleted

## 2020-12-28 ENCOUNTER — Encounter: Payer: Self-pay | Admitting: Pulmonary Disease

## 2020-12-28 ENCOUNTER — Other Ambulatory Visit: Payer: Self-pay

## 2020-12-28 ENCOUNTER — Ambulatory Visit: Payer: Medicare Other | Admitting: Pulmonary Disease

## 2020-12-28 VITALS — BP 128/84 | HR 74 | Ht 64.0 in | Wt 365.1 lb

## 2020-12-28 DIAGNOSIS — Z23 Encounter for immunization: Secondary | ICD-10-CM | POA: Diagnosis not present

## 2020-12-28 DIAGNOSIS — J9611 Chronic respiratory failure with hypoxia: Secondary | ICD-10-CM | POA: Insufficient documentation

## 2020-12-28 DIAGNOSIS — G4733 Obstructive sleep apnea (adult) (pediatric): Secondary | ICD-10-CM | POA: Diagnosis not present

## 2020-12-28 NOTE — Progress Notes (Signed)
Subjective:    Patient ID: Jasmin Martin, female    DOB: 12/10/1955, 65 y.o.   MRN: [id number]  HPI  Chief Complaint  Patient presents with   Consult    Not resting well. Cont. To wake up through out night. Falling asleep during the day. Snores. Suspected sleep apnea reason for consult. Wears 2L O2 at night and prn during the day.    65 year old morbidly obese never smoker presents for evaluation of sleep disordered breathing and chronic respiratory failure. She has been maintained on 2 L oxygen at night for several years, started by Dr. Juanetta Gosling.  I note sleep study from 11-Jul-2009 showing mild OSA with CPAP was never recommended. She is referred by cardiology and their last consultation was reviewed She reports difficulty falling asleep.  No bed partner history is available.  She reports several nocturnal awakenings and frequent daytime naps. Epworth sleepiness score is 18 and she reports sleepiness while sitting and reading, watching TV or as a passenger in a car or lying down to rest in the afternoon.  Bedtime is between 10 and 11 PM, there is a TV in the bedroom.  Her son died in 07-11-16 after drug overdose and she states that her mind is still racing and at bedtime.  She has a special bed and sleeps with her head of bed raised and is able to lie on her back with 1 pillow.  Reports 5-6 nocturnal awakenings including nocturia, she will often just sit up on the side of the bed and rock back-and-forth for several minutes before falling asleep again.  Her best sleep is in the early morning hours after 5 AM and she stays in bed until 8:30 AM when she wakes up feeling tired with dryness of mouth. She has lost weight from a peak of 410 to her current weight of 365 pounds  There is no history suggestive of cataplexy, sleep paralysis or parasomnias    PMH -diabetes type 2, atrial fibrillation, hypertension , chronic diastolic heart failure, mild aortic stenosis  Labs from 11/11/2020 show hypokalemia,  normal renal function  Significant tests/ events reviewed  N PSG 08/2009 -mild OSA with AHI 11/hour, severe PLM's     Past Medical History:  Diagnosis Date   Arthritis    Atrial fibrillation (HCC)    a. on Eliquis   Bilateral knee pain    CHF (congestive heart failure) (HCC)    Depression    DJD (degenerative joint disease)    HTN (hypertension)    Insomnia    Obesity, morbid (HCC)    Persistent atrial fibrillation (HCC)    Renal insufficiency     Past Surgical History:  Procedure Laterality Date   ABDOMINAL HYSTERECTOMY     bilateral knee surgery     CESAREAN SECTION     CHOLECYSTECTOMY  2001   COLONOSCOPY  2005-07-11   Dr. Jena Gauss: friable anal canal, few scattered left-sided diverticula, benign colonic biopsies    right ankle surgery     TONSILLECTOMY     VESICOVAGINAL FISTULA CLOSURE W/ TAH      Allergies  Allergen Reactions   Pedi-Pre Tape Spray [Wound Dressing Adhesive] Rash    White/ leaves a sore spot     Social History   Socioeconomic History   Marital status: Divorced    Spouse name: Not on file   Number of children: Not on file   Years of education: Not on file   Highest education level: Not on file  Occupational History   Not on file  Tobacco Use   Smoking status: Never   Smokeless tobacco: Never   Tobacco comments:    Never smoked  Vaping Use   Vaping Use: Never used  Substance and Sexual Activity   Alcohol use: No    Alcohol/week: 0.0 standard drinks   Drug use: No   Sexual activity: Yes    Birth control/protection: Surgical  Other Topics Concern   Not on file  Social History Narrative   Not on file   Social Determinants of Health   Financial Resource Strain: Not on file  Food Insecurity: Not on file  Transportation Needs: Not on file  Physical Activity: Not on file  Stress: Not on file  Social Connections: Not on file  Intimate Partner Violence: Not on file      Family History  Problem Relation Age of Onset   Cancer Father         Lung    CVA Son    Colon cancer Neg Hx      Review of Systems Shortness of breath with activity Irregular heartbeat Ambulates with a cane Occasional lower extremity edema  Constitutional: negative for anorexia, fevers and sweats  Eyes: negative for irritation, redness and visual disturbance  Ears, nose, mouth, throat, and face: negative for earaches, epistaxis, nasal congestion and sore throat  Respiratory: negative for cough, sputum and wheezing  Cardiovascular: negative for chest pain, orthopnea, palpitations and syncope  Gastrointestinal: negative for abdominal pain, constipation, diarrhea, melena, nausea and vomiting  Genitourinary:negative for dysuria, frequency and hematuria  Hematologic/lymphatic: negative for bleeding, easy bruising and lymphadenopathy  Musculoskeletal:negative for arthralgias, muscle weakness and stiff joints  Neurological: negative for coordination problems, gait problems, headaches and weakness  Endocrine: negative for diabetic symptoms including polydipsia, polyuria and weight loss     Objective:   Physical Exam  Gen. Pleasant, obese, in no distress, normal affect ENT - no pallor,icterus, no post nasal drip, class 2-3 airway Neck: No JVD, no thyromegaly, no carotid bruits Lungs: no use of accessory muscles, no dullness to percussion, decreased without rales or rhonchi  Cardiovascular: Rhythm regular, heart sounds  normal, no murmurs or gallops, no peripheral edema Abdomen: soft and non-tender, no hepatosplenomegaly, BS normal. Musculoskeletal: No deformities, no cyanosis or clubbing Neuro:  alert, non focal, no tremors       Assessment & Plan:

## 2020-12-28 NOTE — Assessment & Plan Note (Signed)
She has been maintained on nocturnal oxygen for 2 to 3 years but I think this is more likely either on the basis of chronic diastolic heart failure or OSA. We will reassess need for oxygen after home sleep test.  If significant desaturation is noted out of proportion to OSA, then we will do a formal titration study to further clarify need for oxygen

## 2020-12-28 NOTE — Assessment & Plan Note (Signed)
Mild OSA was noted on her sleep study in 2011.  We will repeat home sleep study to reassess.  Even though she has lost some weight, it is likely that she still has significant residual OSA. I think this study can be safely performed without oxygen as a home sleep test   The pathophysiology of obstructive sleep apnea , it's cardiovascular consequences & modes of treatment including CPAP were discused with the patient in detail & they evidenced understanding.

## 2020-12-28 NOTE — Patient Instructions (Signed)
Home sleep test 

## 2021-01-01 NOTE — Progress Notes (Signed)
Cardiology Office Note  Date: 01/02/2021   ID: Jasmin, Martin [birth year], MRN [id number]  PCP:  Ponciano Ort, [facility]  Cardiologist:  Dina Rich, MD Electrophysiologist:  None   Chief Complaint: 65-month follow-up  History of Present Illness: Jasmin Martin is a 65 y.o. female with a history of HTN, atrial fibrillation, diastolic HF, OSA, nocturnal hypoxia, chronic respiratory failure with hypoxia on home O2, morbid obesity, mild aortic stenosis,  She was last seen by Dr. Wyline Mood on 11/02/2020.  She had had a recent admission in July 2022 with acute on chronic diastolic heart failure.  Echocardiogram demonstrated EF of 55 to 60%, indeterminate diastolic function, normal RV.  BNP was 161.  Chest x-ray was negative.  She reported 20 pound weight gain over 4 months.  Weight on admission was 389 pounds.  Weight in February 2022 was 372.  Weight during visit was 361.  She had some mild swelling in her legs and breathing was much better.  She was continuing to use O2 at night for nocturnal hypoxia.  She reported negative sleep study for OSA several years ago.  Admitted to snoring, and daytime somnolence.  No apneic episodes.  Her atrial fibrillation was controlled with no symptoms and continuing current medications.  Her weight was down approximately 30 pounds when she was recently admitted.  She was on torsemide 60 mg daily.  Plans were to recheck basic metabolic panel and magnesium.  Symptoms were well controlled.  She reported a sleep study a long time ago and was told she just needed nocturnal oxygen.  She was positive for snoring and daytime somnolence.  Significant history of atrial fibrillation, diastolic HF.  Plan was to refer to pulmonary for repeat evaluation.  Recent echocardiogram 10/12/2020 EF 55 to 60%.  Mild LVH indeterminate diastolic parameters, RV size mildly enlarged, mildly elevated PASP 41 mmHg, pericardial effusion circumferential, mild mitral stenosis, mild to moderate  TR, mild aortic valve stenosis.  She saw Dr. Vassie Loll pulmonology on 12/28/2020.  He noted she had been maintained on nocturnal oxygen for 2 to 3 years but he thought it was more likely either on the basis of chronic diastolic heart failure or OSA.  He mention he would reassess need for oxygen after home sleep test.  He noted she had mild OSA on her sleep study and 2011.  Plan was to repeat home sleep study to reassess.  He mentioned although she had lost some weight it was likely she has a significant residual OSA.  She is here today for 57-month follow-up.  She states she has been doing well.  She recently saw Dr. Vassie Loll as noted above.  She has an upcoming home sleep study to reassess her sleep apnea.  She states she has been maintaining her weight around her weight when she was discharged from the hospital at last admission.  Weight today is 364.  Her discharge weight at previous hospitalization was 361.  She is asking if she could possibly reduce her torsemide dosage due to frequent trips to the bathroom.  States she has been watching her fluid intake and only drinking about 62 ounces per day.  She is watching her salt intake.  States she is weighing her self every day.  She has no lower extremity edema.  She has chronic shortness of breath which is likely multifactorial.  She is not wearing her oxygen today.  Has significant morbid obesity with BMI of 62.5.  She states she knows a large  part of her problem has been significantly overweight.   Past Medical History:  Diagnosis Date   Arthritis    Atrial fibrillation (HCC)    a. on Eliquis   Bilateral knee pain    CHF (congestive heart failure) (HCC)    Depression    DJD (degenerative joint disease)    HTN (hypertension)    Insomnia    Obesity, morbid (HCC)    Persistent atrial fibrillation (HCC)    Renal insufficiency     Past Surgical History:  Procedure Laterality Date   ABDOMINAL HYSTERECTOMY     bilateral knee surgery     CESAREAN SECTION      CHOLECYSTECTOMY  2001   COLONOSCOPY  2007   Dr. Jena Gauss: friable anal canal, few scattered left-sided diverticula, benign colonic biopsies    right ankle surgery     TONSILLECTOMY     VESICOVAGINAL FISTULA CLOSURE W/ TAH      Current Outpatient Medications  Medication Sig Dispense Refill   albuterol (VENTOLIN HFA) 108 (90 Base) MCG/ACT inhaler Inhale 2 puffs into the lungs every 6 (six) hours as needed for wheezing or shortness of breath.     apixaban (ELIQUIS) 5 MG TABS tablet Take 5 mg by mouth 2 (two) times daily.     diltiazem (CARDIZEM CD) 300 MG 24 hr capsule TAKE ONE CAPSULE BY MOUTH ONCE DAILY. 90 capsule 0   hydrOXYzine (ATARAX/VISTARIL) 25 MG tablet Take 25 mg by mouth every 8 (eight) hours as needed.     metFORMIN (GLUCOPHAGE) 500 MG tablet Take 500 mg by mouth every morning.     metoprolol (TOPROL-XL) 200 MG 24 hr tablet TAKE (1) TABLET BY MOUTH ONCE DAILY. 90 tablet 0   oxyCODONE-acetaminophen (PERCOCET) 10-325 MG per tablet Take 1 tablet by mouth every 6 (six) hours.     OXYGEN Inhale 2 L into the lungs daily.     potassium chloride SA (KLOR-CON) 20 MEQ tablet Take 1 tablet (20 mEq total) by mouth daily. 30 tablet 1   pravastatin (PRAVACHOL) 40 MG tablet Take 40 mg by mouth daily.     torsemide (DEMADEX) 20 MG tablet Take 2 tablets (40 mg total) by mouth daily. May take an additional 20 mg daily as needed for weight gain of 3 lbs/24 hours or 5 lbs/week 220 tablet 3   No current facility-administered medications for this visit.   Allergies:  Pedi-pre tape spray [wound dressing adhesive]   Social History: The patient  reports that she has never smoked. She has never used smokeless tobacco. She reports that she does not drink alcohol and does not use drugs.   Family History: The patient's family history includes CVA in her son; Cancer in her father.   ROS:  Please see the history of present illness. Otherwise, complete review of systems is positive for none.  All other  systems are reviewed and negative.   Physical Exam: VS:  BP 136/84   Pulse 76   Ht 5\' 4"  (1.626 m)   Wt (!) 364 lb 9.6 oz (165.4 kg)   SpO2 96%   BMI 62.58 kg/m , BMI Body mass index is 62.58 kg/m.  Wt Readings from Last 3 Encounters:  01/02/21 (!) 364 lb 9.6 oz (165.4 kg)  12/28/20 (!) 365 lb 1.9 oz (165.6 kg)  12/12/20 (!) 367 lb (166.5 kg)    General: Significant morbid obesity patient appears comfortable at rest. Neck: Supple, no elevated JVP or carotid bruits, no thyromegaly. Lungs: Clear to auscultation,  nonlabored breathing at rest. Cardiac: Regular rate and rhythm, no S3 or significant systolic murmur, no pericardial rub. Extremities: No pitting edema, distal pulses 2+. Skin: Warm and dry. Musculoskeletal: No kyphosis. Neuropsychiatric: Alert and oriented x3, affect grossly appropriate.  ECG:    Recent Labwork: 08/07/2020: ALT 38; AST 34 10/11/2020: B Natriuretic Peptide 161.0 11/04/2020: Magnesium 1.6 11/11/2020: BUN 23; Creatinine, Ser 0.95; Hemoglobin 13.1; Platelets 237; Potassium 2.9; Sodium 138     Component Value Date/Time   CHOL 169 11/03/2014 0712   TRIG 193 (H) 11/03/2014 0712   HDL 40 (L) 11/03/2014 0712   CHOLHDL 4.2 11/03/2014 0712   VLDL 39 (H) 11/03/2014 0712   LDLCALC 90 11/03/2014 0712    Other Studies Reviewed Today:  Echocardiogram 10/12/2020   1. Left ventricular ejection fraction, by estimation, is 55 to 60%. The  left ventricle has normal function. The left ventricle has no regional  wall motion abnormalities. There is mild left ventricular hypertrophy.  Left ventricular diastolic parameters  are indeterminate.   2. Right ventricular systolic function is normal. The right ventricular  size is mildly enlarged. There is mildly elevated pulmonary artery  systolic pressure.   3. The pericardial effusion is circumferential.   4. The mitral valve is abnormal. No evidence of mitral valve  regurgitation. Mild mitral stenosis.   5. The tricuspid  valve is abnormal. Tricuspid valve regurgitation is mild  to moderate.   6. The aortic valve has an indeterminant number of cusps. There is  moderate calcification of the aortic valve. There is moderate thickening  of the aortic valve. Aortic valve regurgitation is not visualized. Mild  aortic valve stenosis. Aortic valve mean  gradient measures 13.0 mmHg. Aortic valve peak gradient measures 25.0  mmHg. Aortic valve area, by VTI measures 1.41 cm.   7. Moderate pulmonary HTN, PASP is 41 mmHg.   8. The inferior vena cava is dilated in size with >50% respiratory  variability, suggesting right atrial pressure of 8 mmHg.   FINDINGS    07/14/11 Echo: LVEF 50-55%, mild LVH, mod LAE   06/2014 MPI IMPRESSION: 1. No reversible ischemia or infarction. Hypertensive response to stress.   2. Normal left ventricular wall motion.   3. Left ventricular ejection fraction 30%. However, this is likely due to gating abnormalities as echocardiogram on 06/17/14 reported normal LV systolic function, LVEF 55-60%. Function appears grossly normal on nuclear images.   4. Low-risk stress test findings*.   06/2014 echo Study Conclusions  - Left ventricle: Systolic function was normal. The estimated   ejection fraction was in the range of 55% to 60%. - Aortic valve: Mildly calcified annulus. Trileaflet; mildly   thickened leaflets. Valve area (VTI): 1.52 cm^2. - Mitral valve: Mildly calcified annulus. - Left atrium: The atrium was severely dilated. - Right ventricle: The cavity size was mildly dilated. - Right atrium: The atrium was severely dilated. - Atrial septum: There was a patent foramen ovale. - Technically adequate study.    Assessment and Plan:  1. Atrial fibrillation, unspecified type (HCC)   2. Chronic diastolic heart failure (HCC)   3. Nocturnal hypoxia   4. Suspected sleep apnea    1. Atrial fibrillation, unspecified type (HCC) Rates controlled today at 76.  Denies any bleeding on  Eliquis.  Continue Eliquis 5 mg p.o. twice daily.  Continue Toprol-XL 200 mg p.o. daily.  Continue Cardizem CD 300 mg p.o. daily  2. Chronic diastolic heart failure (HCC) Recent echocardiogram 10/12/2020 EF 55 to 60%.  Mild  LVH indeterminate diastolic parameters, RV size mildly enlarged, mildly elevated PASP 41 mmHg, pericardial effusion circumferential, mild mitral stenosis, mild to moderate TR, mild aortic valve stenosis.  Continue Toprol-XL 200 mg p.o. daily.  Her weight today is 364 which is near her baseline weight at discharge from last hospitalization.  She is asking if she can reduced her torsemide dosage.  Torsemide to 40 mg daily.  Advised her if she gains 3 pounds in a day or 5 pounds in a week she can take an extra 20 mg as needed.  Continue potassium 20 mill equivalents daily.  Continue to restrict sodium intake and restrict fluid intake to approximately 60 ounces per day.  Continue to weigh yourself every day.  3. Nocturnal hypoxia Continuing O2 at night for nocturnal hypoxia.  Continue O2 as directed.  4. Suspected sleep apnea She has an upcoming home sleep study to reevaluate for sleep apnea ordered by Dr. Vassie Loll.  Dr. Vassie Loll plans to reassess for need for oxygen after sleep study has been done.  5.  DOE/SOB DOE/SOB is likely multifactorial due to significant morbid obesity/obesity hypoventilation syndrome, OSA, chronic diastolic heart failure.  Medication Adjustments/Labs and Tests Ordered: Current medicines are reviewed at length with the patient today.  Concerns regarding medicines are outlined above.   Disposition: Follow-up with Dr. Wyline Mood or APP 1 year  Signed, Rennis Harding, NP 01/02/2021 4:48 PM    [facility] at [facility] [address] [place], [place], [place] [address] Phone: [phone]; Fax: [phone]

## 2021-01-02 ENCOUNTER — Encounter: Payer: Self-pay | Admitting: Family Medicine

## 2021-01-02 ENCOUNTER — Other Ambulatory Visit: Payer: Self-pay

## 2021-01-02 ENCOUNTER — Ambulatory Visit: Payer: Medicare Other | Admitting: Family Medicine

## 2021-01-02 VITALS — BP 136/84 | HR 76 | Ht 64.0 in | Wt 364.6 lb

## 2021-01-02 DIAGNOSIS — I5032 Chronic diastolic (congestive) heart failure: Secondary | ICD-10-CM

## 2021-01-02 DIAGNOSIS — I4891 Unspecified atrial fibrillation: Secondary | ICD-10-CM | POA: Diagnosis not present

## 2021-01-02 DIAGNOSIS — G4734 Idiopathic sleep related nonobstructive alveolar hypoventilation: Secondary | ICD-10-CM | POA: Diagnosis not present

## 2021-01-02 DIAGNOSIS — R29818 Other symptoms and signs involving the nervous system: Secondary | ICD-10-CM

## 2021-01-02 MED ORDER — TORSEMIDE 20 MG PO TABS: 40.0000 mg | ORAL_TABLET | Freq: Every day | ORAL | 3 refills | Status: DC

## 2021-01-02 NOTE — Patient Instructions (Addendum)
Medication Instructions:  Your physician has recommended you make the following change in your medication:  Decrease torsemide to 40 mg daily. May take an additional 20 mg daily as needed for weight gain of 3 lbs/24 hours or 5 lbs/week Continue other medications the same  Labwork: none  Testing/Procedures: none  Follow-Up: Your physician recommends that you schedule a follow-up appointment in: 1 year. You will receive a reminder cal or letter in the mail in about 10 months reminding you to call and schedule your appointment. If you don't receive this letter, please contact our office.  Any Other Special Instructions Will Be Listed Below (If Applicable).  If you need a refill on your cardiac medications before your next appointment, please call your pharmacy.

## 2021-01-12 ENCOUNTER — Ambulatory Visit: Payer: Medicare Other | Admitting: Orthopaedic Surgery

## 2021-01-12 ENCOUNTER — Encounter: Payer: Self-pay | Admitting: Orthopaedic Surgery

## 2021-01-12 ENCOUNTER — Other Ambulatory Visit: Payer: Self-pay

## 2021-01-12 ENCOUNTER — Ambulatory Visit: Payer: Medicare Other

## 2021-01-12 VITALS — BP 116/60 | HR 61 | Ht 64.0 in | Wt 364.0 lb

## 2021-01-12 DIAGNOSIS — Z6841 Body Mass Index (BMI) 40.0 and over, adult: Secondary | ICD-10-CM

## 2021-01-12 DIAGNOSIS — M79672 Pain in left foot: Secondary | ICD-10-CM | POA: Diagnosis not present

## 2021-01-12 DIAGNOSIS — G8929 Other chronic pain: Secondary | ICD-10-CM

## 2021-01-12 DIAGNOSIS — M1A0720 Idiopathic chronic gout, left ankle and foot, without tophus (tophi): Secondary | ICD-10-CM

## 2021-01-12 NOTE — Progress Notes (Signed)
My left foot hurts.  She has history of stress fracture of the right foot I have treated in the past.  She has recently developed pain in the left foot over the last three weeks that is getting worse.  She feels she may have a stress fracture on the left foot.  She says it acts like the right one did in the past.  She has some swelling and it hurts just to have a sheet on it at night.  She denies history of gout but it has been in her family.  I will get serum uric acid.  She has slight dorsal swelling of the left foot, no redness.  NV intact.  She says she cannot stand on it.  She is in a wheelchair.    Encounter Diagnoses  Name Primary?   Chronic foot pain, left Yes   Body mass index 60.0-69.9, adult (HCC)    Morbid obesity (HCC)    Chronic idiopathic gout involving toe of left foot without tophus    I got X-rays of the left foot, reported separately. I do not appreciate a fracture but she may have one.    I will have her limit activity.  She does not want a boot or CAM walker.  I will get serum uric acid to rule out gout.  Return in three weeks. X-rays of the left foot then.  Call if any problem.  Precautions discussed.  Electronically Signed Darreld Mclean, MD 10/13/202210:05 AM

## 2021-01-13 ENCOUNTER — Telehealth: Payer: Self-pay | Admitting: Orthopaedic Surgery

## 2021-01-13 LAB — URIC ACID: Uric Acid, Serum: 7.8 mg/dL — ABNORMAL HIGH (ref 2.5–7.0)

## 2021-01-13 NOTE — Telephone Encounter (Signed)
Patient called regarding labs which she said she has seen the results on MyChart that she "has gout".  Please advise if any medication is to be ordered. If so, patient uses: [facility] [place]

## 2021-01-16 ENCOUNTER — Telehealth: Payer: Self-pay

## 2021-01-16 MED ORDER — ALLOPURINOL 300 MG PO TABS: 300.0000 mg | ORAL_TABLET | Freq: Every day | ORAL | 5 refills | Status: DC

## 2021-01-16 NOTE — Telephone Encounter (Signed)
Marcelino Duster with [facility] called needing to see if its okay to put patient's physical therapy on hold. Per Marcelino Duster patient is in a lot of pain and can't really do therapy at this time.  Please call and advise [phone]

## 2021-01-16 NOTE — Telephone Encounter (Signed)
Ok to put on hold, left message for Marcelino Duster to advise.   She should probably make appointment since she is in too much pain to complete therapy

## 2021-01-16 NOTE — Telephone Encounter (Signed)
Patient called to see if anything had been called in for her gout. Stated that she read her lab results via MyChart.   PATIENT USES [facility]

## 2021-02-02 ENCOUNTER — Ambulatory Visit: Payer: Medicare Other | Admitting: Orthopaedic Surgery

## 2021-02-15 ENCOUNTER — Telehealth: Payer: Self-pay

## 2021-02-15 NOTE — Telephone Encounter (Signed)
Patient called wanting to see if you could prescribe something for her gout. States she has been taking the Allopurinol for a month now and no relief. States it is full blown and she can not walk.  PATIENT USES [facility]  Please advise

## 2021-02-16 MED ORDER — PREDNISONE 5 MG (21) PO TBPK: ORAL_TABLET | ORAL | 0 refills | Status: DC

## 2021-03-02 ENCOUNTER — Telehealth: Payer: Self-pay | Admitting: Cardiology

## 2021-03-02 MED ORDER — PRAVASTATIN SODIUM 40 MG PO TABS: 40.0000 mg | ORAL_TABLET | Freq: Every day | ORAL | 6 refills | Status: DC

## 2021-03-02 NOTE — Telephone Encounter (Signed)
Done

## 2021-03-02 NOTE — Telephone Encounter (Signed)
*  STAT* If patient is at the pharmacy, call can be transferred to refill team.   1. Which medications need to be refilled? (please list name of each medication and dose if known)  pravastatin (PRAVACHOL) 40 MG tablet  2. Which pharmacy/location (including street and city if local pharmacy) is medication to be sent to?[facility] - [place], [place] - [address]  3. Do they need a 30 day or 90 day supply?90 day

## 2021-03-15 ENCOUNTER — Other Ambulatory Visit: Payer: Self-pay | Admitting: Cardiology

## 2021-03-16 DIAGNOSIS — M48061 Spinal stenosis, lumbar region without neurogenic claudication: Secondary | ICD-10-CM | POA: Insufficient documentation

## 2021-03-17 DIAGNOSIS — M5416 Radiculopathy, lumbar region: Secondary | ICD-10-CM | POA: Insufficient documentation

## 2021-05-04 ENCOUNTER — Other Ambulatory Visit: Payer: Self-pay | Admitting: Cardiology

## 2021-05-18 ENCOUNTER — Telehealth: Payer: Self-pay | Admitting: Orthopaedic Surgery

## 2021-06-23 ENCOUNTER — Other Ambulatory Visit: Payer: Self-pay | Admitting: Cardiology

## 2021-08-18 ENCOUNTER — Encounter: Payer: Self-pay | Admitting: Cardiology

## 2021-08-18 ENCOUNTER — Telehealth: Payer: Self-pay | Admitting: Cardiology

## 2021-08-18 ENCOUNTER — Ambulatory Visit: Payer: Medicare Other | Admitting: Cardiology

## 2021-08-18 VITALS — BP 110/78 | HR 55 | Ht 64.0 in | Wt 375.2 lb

## 2021-08-18 DIAGNOSIS — I5033 Acute on chronic diastolic (congestive) heart failure: Secondary | ICD-10-CM

## 2021-08-18 DIAGNOSIS — I4891 Unspecified atrial fibrillation: Secondary | ICD-10-CM | POA: Diagnosis not present

## 2021-08-18 DIAGNOSIS — R0602 Shortness of breath: Secondary | ICD-10-CM | POA: Diagnosis not present

## 2021-08-18 NOTE — Progress Notes (Signed)
Clinical Summary Jasmin Martin is a 66 y.o.female seen today for follow up of the following medical problem.s   1. Afib - no symptoms, compliant with meds   - palpitations at times.  - compliant with meds     2. HTN - she is compliant with meds   3. Nocturnal hypoxia - uses home O2 at night, reports negative sleep study for OSA several years ago.  - +snoring, no apneic episodes, +daytime somnolence   - upcoming sleep apnea test.     4. Chronic diastolic HF - admit 0000000 with acute on chronic diastolic HF - - 0000000 LVE 0000000, indet diastolic function, normal RV - BNP 161, CXR no acute process. Reported 20 lbs weight gain over 4 months - wt 177 kg on admission(389 lbs), was 372 lbs in 05/2020     - over last month increased LE edema, SOB/DOE. +PND - usually weight low 360s, today up to 375 lbs - taking torsemide 60mg  daily and 40mg  for last few weeks, fluid not improving.        5. Mild aortic stenosis - Aortic valve mean gradient measures 13.0 mmHg. Aortic valve peak gradient measures 25.0  mmHg. Aortic valve area, by VTI measures 1.41 cm.     6. Chronic respiraotory failure - on home O2 2L     SH: son passed away 02/26/2017 Past Medical History:  Diagnosis Date   Arthritis    Atrial fibrillation ([place])    a. on Eliquis   Bilateral knee pain    CHF (congestive heart failure) (HCC)    Depression    DJD (degenerative joint disease)    HTN (hypertension)    Insomnia    Obesity, morbid (HCC)    Persistent atrial fibrillation (HCC)    Renal insufficiency      Allergies  Allergen Reactions   Pedi-Pre Tape Spray [Wound Dressing Adhesive] Rash    White/ leaves a sore spot     Current Outpatient Medications  Medication Sig Dispense Refill   albuterol (VENTOLIN HFA) 108 (90 Base) MCG/ACT inhaler Inhale 2 puffs into the lungs every 6 (six) hours as needed for wheezing or shortness of breath.     allopurinol (ZYLOPRIM) 300 MG tablet TAKE 1 TABLET BY MOUTH  ONCE DAILY. 30 tablet 5   apixaban (ELIQUIS) 5 MG TABS tablet Take 5 mg by mouth 2 (two) times daily.     diltiazem (CARDIZEM CD) 300 MG 24 hr capsule TAKE ONE CAPSULE BY MOUTH ONCE DAILY. 90 capsule 3   hydrOXYzine (ATARAX/VISTARIL) 25 MG tablet Take 25 mg by mouth every 8 (eight) hours as needed.     metFORMIN (GLUCOPHAGE) 500 MG tablet Take 500 mg by mouth every morning.     metoprolol (TOPROL-XL) 200 MG 24 hr tablet TAKE (1) TABLET BY MOUTH ONCE DAILY. 90 tablet 1   oxyCODONE-acetaminophen (PERCOCET) 10-325 MG per tablet Take 1 tablet by mouth every 6 (six) hours.     OXYGEN Inhale 2 L into the lungs daily.     potassium chloride SA (KLOR-CON) 20 MEQ tablet Take 1 tablet (20 mEq total) by mouth daily. 30 tablet 1   pravastatin (PRAVACHOL) 40 MG tablet Take 1 tablet (40 mg total) by mouth daily. 30 tablet 6   predniSONE (STERAPRED UNI-PAK 21 TAB) 5 MG (21) TBPK tablet Take 6 pills first day; 5 pills second day; 4 pills third day; 3 pills fourth day; 2 pills next day and 1 pill last day. 21 tablet  0   torsemide (DEMADEX) 20 MG tablet Take 2 tablets (40 mg total) by mouth daily. May take an additional 20 mg daily as needed for weight gain of 3 lbs/24 hours or 5 lbs/week 220 tablet 3   No current facility-administered medications for this visit.     Past Surgical History:  Procedure Laterality Date   ABDOMINAL HYSTERECTOMY     bilateral knee surgery     CESAREAN SECTION     CHOLECYSTECTOMY  2001   COLONOSCOPY  2007   Dr. Gala Romney: friable anal canal, few scattered left-sided diverticula, benign colonic biopsies    right ankle surgery     TONSILLECTOMY     VESICOVAGINAL FISTULA CLOSURE W/ TAH       Allergies  Allergen Reactions   Pedi-Pre Tape Spray [Wound Dressing Adhesive] Rash    White/ leaves a sore spot      Family History  Problem Relation Age of Onset   Cancer Father        Lung    CVA Son    Colon cancer Neg Hx      Social History Jasmin Martin reports that she has  never smoked. She has never used smokeless tobacco. Jasmin Martin reports no history of alcohol use.   Review of Systems CONSTITUTIONAL: No weight loss, fever, chills, weakness or fatigue.  HEENT: Eyes: No visual loss, blurred vision, double vision or yellow sclerae.No hearing loss, sneezing, congestion, runny nose or sore throat.  SKIN: No rash or itching.  CARDIOVASCULAR: per hpi RESPIRATORY: per hpi GASTROINTESTINAL: No anorexia, nausea, vomiting or diarrhea. No abdominal pain or blood.  GENITOURINARY: No burning on urination, no polyuria NEUROLOGICAL: No headache, dizziness, syncope, paralysis, ataxia, numbness or tingling in the extremities. No change in bowel or bladder control.  MUSCULOSKELETAL: No muscle, back pain, joint pain or stiffness.  LYMPHATICS: No enlarged nodes. No history of splenectomy.  PSYCHIATRIC: No history of depression or anxiety.  ENDOCRINOLOGIC: No reports of sweating, cold or heat intolerance. No polyuria or polydipsia.  Marland Kitchen   Physical Examination Today's Vitals   08/18/21 1002  BP: 110/78  Pulse: (!) 55  SpO2: 92%  Weight: (!) 375 lb 3.2 oz (170.2 kg)  Height: 5\' 4"  (1.626 m)   Body mass index is 64.4 kg/m.  Gen: resting comfortably, no acute distress HEENT: no scleral icterus, pupils equal round and reactive, no palptable cervical adenopathy,  CV: irreg, 2/6 systolic murmur rusb Resp: Clear to auscultation bilaterally GI: abdomen is soft, non-tender, non-distended, normal bowel sounds, no hepatosplenomegaly MSK: extremities are warm, 2+ bilateral LE edema Skin: warm, no rash Neuro:  no focal deficits Psych: appropriate affect   Diagnostic Studies   07/14/11 Echo: LVEF 50-55%, mild LVH, mod LAE   06/2014 MPI IMPRESSION: 1. No reversible ischemia or infarction. Hypertensive response to stress.   2. Normal left ventricular wall motion.   3. Left ventricular ejection fraction 30%. However, this is likely due to gating abnormalities as  echocardiogram on 06/17/14 reported normal LV systolic function, LVEF 0000000. Function appears grossly normal on nuclear images.   4. Low-risk stress test findings*.   06/2014 echo Study Conclusions  - Left ventricle: Systolic function was normal. The estimated   ejection fraction was in the range of 55% to 60%. - Aortic valve: Mildly calcified annulus. Trileaflet; mildly   thickened leaflets. Valve area (VTI): 1.52 cm^2. - Mitral valve: Mildly calcified annulus. - Left atrium: The atrium was severely dilated. - Right ventricle: The cavity size was mildly  dilated. - Right atrium: The atrium was severely dilated. - Atrial septum: There was a patent foramen ovale. - Technically adequate study.    Assessment and Plan  1. Acute on chronic diastolic - significantlt volume overloaded, weight 375 today usually she is low 360s - she increased her torsemide to 60mg  in AM and 40mg  in PM without improvement. Had labs 2 days ago with pcp, request and if stable increase torsemide to 80mg  bid over the weekend and have her update [place] Monday on weights.May require a few days of metolazone if not diuresing on higher torsemide - repeat echo given worsening fluid retention despite high dose diuretic.   *unable to get labs Friday, try again Monday. Will go ahead and have her take the torsemide 80mg  bid over he weekend  2. Afib/acquired thrombophilia - no symptoms - EKG today shows rate controlled afib      Arnoldo Lenis, M.D

## 2021-08-18 NOTE — Patient Instructions (Signed)
Medication Instructions:  Continue all current medications.  Labwork: none  Testing/Procedures:  Your physician has requested that you have an echocardiogram. Echocardiography is a painless test that uses sound waves to create images of your heart. It provides your doctor with information about the size and shape of your heart and how well your heart's chambers and valves are working. This procedure takes approximately one hour. There are no restrictions for this procedure.  Office will contact with results via phone or letter.    Follow-Up: Pending test results.  Any Other Special Instructions Will Be Listed Below (If Applicable).  If you need a refill on your cardiac medications before your next appointment, please call your pharmacy.  

## 2021-08-18 NOTE — Telephone Encounter (Signed)
Checking percert on the following patient for testing scheduled at [facility].      ECHO     08/25/2021

## 2021-08-21 ENCOUNTER — Encounter: Payer: Self-pay | Admitting: Cardiology

## 2021-08-21 ENCOUNTER — Encounter: Payer: Self-pay | Admitting: *Deleted

## 2021-08-22 ENCOUNTER — Other Ambulatory Visit: Payer: Self-pay | Admitting: *Deleted

## 2021-08-22 DIAGNOSIS — I10 Essential (primary) hypertension: Secondary | ICD-10-CM

## 2021-08-22 DIAGNOSIS — Z79899 Other long term (current) drug therapy: Secondary | ICD-10-CM

## 2021-08-22 DIAGNOSIS — I5033 Acute on chronic diastolic (congestive) heart failure: Secondary | ICD-10-CM

## 2021-08-22 NOTE — Telephone Encounter (Signed)
Weight moving in right direction, can she continue the torsemide 80mg  bid. Can she get a bmet/mg/bnp this week please   J Kimiko Common MD

## 2021-08-25 ENCOUNTER — Ambulatory Visit (HOSPITAL_COMMUNITY)
Admission: RE | Admit: 2021-08-25 | Discharge: 2021-08-25 | Disposition: A | Discharge status: Home / Self Care | Payer: Medicare Other | Source: Ambulatory Visit | Attending: Cardiology | Admitting: Cardiology

## 2021-08-25 DIAGNOSIS — R0602 Shortness of breath: Secondary | ICD-10-CM | POA: Diagnosis present

## 2021-08-25 LAB — ECHOCARDIOGRAM COMPLETE
AR max vel: 1.23 cm2
AV Area VTI: 1.22 cm2
AV Area mean vel: 1.24 cm2
AV Mean grad: 10 mmHg
AV Peak grad: 17 mmHg
Ao pk vel: 2.06 m/s
Area-P 1/2: 3.65 cm2
S' Lateral: 3.4 cm

## 2021-08-25 NOTE — Progress Notes (Signed)
*  PRELIMINARY RESULTS* Echocardiogram 2D Echocardiogram has been performed.  Stacey Drain 08/25/2021, 1:41 PM

## 2021-08-26 LAB — MAGNESIUM: Magnesium: 1.6 mg/dL (ref 1.6–2.3)

## 2021-08-26 LAB — BASIC METABOLIC PANEL
BUN/Creatinine Ratio: 11 — ABNORMAL LOW (ref 12–28)
BUN: 10 mg/dL (ref 8–27)
CO2: 30 mmol/L — ABNORMAL HIGH (ref 20–29)
Calcium: 9.4 mg/dL (ref 8.7–10.3)
Chloride: 92 mmol/L — ABNORMAL LOW (ref 96–106)
Creatinine, Ser: 0.9 mg/dL (ref 0.57–1.00)
Glucose: 142 mg/dL — ABNORMAL HIGH (ref 70–99)
Potassium: 3.8 mmol/L (ref 3.5–5.2)
Sodium: 143 mmol/L (ref 134–144)
eGFR: 70 mL/min/{1.73_m2} (ref >59)

## 2021-08-26 LAB — BRAIN NATRIURETIC PEPTIDE: BNP: 78.8 pg/mL (ref 0.0–100.0)

## 2021-08-29 ENCOUNTER — Encounter: Payer: Self-pay | Admitting: *Deleted

## 2021-08-31 MED ORDER — TORSEMIDE 20 MG PO TABS: 80.0000 mg | ORAL_TABLET | Freq: Two times a day (BID) | ORAL | Status: DC

## 2021-08-31 NOTE — Telephone Encounter (Signed)
Patient notified and verbalized understanding.  She will take the appointment on 09/06/2021 in [place] office & confirms that she is still taking the Torsemide at 80mg  twice a day.

## 2021-08-31 NOTE — Telephone Encounter (Signed)
Can she see me 1140 June 7th to reevaluate. By weights we have made good progress. Please clarify she is still taking the torsemide 80mg  bid. We may have to cut back in the near future, seeing her next week would help decide  MD

## 2021-09-06 ENCOUNTER — Ambulatory Visit: Payer: Medicare Other | Admitting: Cardiology

## 2021-09-06 ENCOUNTER — Encounter: Payer: Self-pay | Admitting: Cardiology

## 2021-09-06 VITALS — BP 122/82 | HR 98 | Ht 63.0 in | Wt 360.0 lb

## 2021-09-06 DIAGNOSIS — I5032 Chronic diastolic (congestive) heart failure: Secondary | ICD-10-CM

## 2021-09-06 DIAGNOSIS — Z79899 Other long term (current) drug therapy: Secondary | ICD-10-CM

## 2021-09-06 MED ORDER — TORSEMIDE 20 MG PO TABS: 80.0000 mg | ORAL_TABLET | Freq: Every day | ORAL | Status: DC

## 2021-09-06 NOTE — Patient Instructions (Addendum)
Medication Instructions:  Your physician has recommended you make the following change in your medication:  Hold Torsemide today and tomorrow. You may restart medication on Friday 09/08/2021. Take 80 mg daily.  You may also take additional 80mg  as needed for weight above 365 lbs.    Labwork: In 1 week: -BMET MAG  Testing/Procedures: None  Follow-Up: Follow up with Dr. in 4 months.   Any Other Special Instructions Will Be Listed Below (If Applicable).     If you need a refill on your cardiac medications before your next appointment, please call your pharmacy.

## 2021-09-06 NOTE — Progress Notes (Signed)
Clinical Summary [name] is a 66 y.o.female seen today for follow up of the following medical problem. This is a focused visit on recent issues with acute on chronic diastolic heart failure        4. Chronic diastolic HF - admit 09/2020 with acute on chronic diastolic HF - - 09/2020 LVE 55-60%, indet diastolic function, normal RV - BNP 161, CXR no acute process. Reported 20 lbs weight gain over 4 months - wt 177 kg on admission(389 lbs), was 372 lbs in 05/2020       - over last month increased LE edema, SOB/DOE. +PND - usually weight low 360s, today up to 375 lbs - taking torsemide 60mg  daily and 40mg  for last few weeks, fluid not improving.     - last visit we increased torsemide to 80mg  bid - repeat labs showed Cr 0.9, K 3.8, BNP 79 - 07/2021 echo: LVEF 65-70%, indet diastolic, normal RV, mod LAE, severe RAE, mild to mod TR, mild AS - from phone calls home weight 365-->358 lbs.  - by clinic weights down from 375-->360 lbs  - she reports limiting fluid intake.  - orthostatic symptoms. Home weight 359 lbs     2. Mild aortic stenosis - Aortic valve mean gradient measures 13.0 mmHg. Aortic valve peak gradient measures 25.0  mmHg. Aortic valve area, by VTI measures 1.41 cm.   07/2021 echoL mean grad 10, AVA VTI 1.22, SVI 22         SH: son passed away 01/2017  Past Medical History:  Diagnosis Date   Arthritis    Atrial fibrillation (HCC)    a. on Eliquis   Bilateral knee pain    CHF (congestive heart failure) (HCC)    Depression    DJD (degenerative joint disease)    HTN (hypertension)    Insomnia    Obesity, morbid (HCC)    Persistent atrial fibrillation (HCC)    Renal insufficiency      Allergies  Allergen Reactions   Pedi-Pre Tape Spray [Wound Dressing Adhesive] Rash    White/ leaves a sore spot     Current Outpatient Medications  Medication Sig Dispense Refill   albuterol (VENTOLIN HFA) 108 (90 Base) MCG/ACT inhaler Inhale 2 puffs into the  lungs every 6 (six) hours as needed for wheezing or shortness of breath.     allopurinol (ZYLOPRIM) 300 MG tablet TAKE 1 TABLET BY MOUTH ONCE DAILY. 30 tablet 5   apixaban (ELIQUIS) 5 MG TABS tablet Take 5 mg by mouth 2 (two) times daily.     diltiazem (CARDIZEM CD) 300 MG 24 hr capsule TAKE ONE CAPSULE BY MOUTH ONCE DAILY. 90 capsule 3   gabapentin (NEURONTIN) 100 MG capsule Take 100 mg by mouth 3 (three) times daily as needed.     hydrOXYzine (ATARAX/VISTARIL) 25 MG tablet Take 25 mg by mouth every 8 (eight) hours as needed. (Patient not taking: Reported on 08/18/2021)     metFORMIN (GLUCOPHAGE) 500 MG tablet Take 500 mg by mouth every morning.     methocarbamol (ROBAXIN) 500 MG tablet Take 500 mg by mouth 2 (two) times daily as needed.     metoprolol (TOPROL-XL) 200 MG 24 hr tablet TAKE (1) TABLET BY MOUTH ONCE DAILY. 90 tablet 1   montelukast (SINGULAIR) 10 MG tablet Take 10 mg by mouth daily.     Oxycodone HCl 10 MG TABS Take 10 mg by mouth every 6 (six) hours as needed.     OXYGEN  Inhale 2 L into the lungs daily.     potassium chloride SA (KLOR-CON) 20 MEQ tablet Take 1 tablet (20 mEq total) by mouth daily. 30 tablet 1   pravastatin (PRAVACHOL) 40 MG tablet Take 1 tablet (40 mg total) by mouth daily. 30 tablet 6   predniSONE (STERAPRED UNI-PAK 21 TAB) 5 MG (21) TBPK tablet Take 6 pills first day; 5 pills second day; 4 pills third day; 3 pills fourth day; 2 pills next day and 1 pill last day. (Patient not taking: Reported on 08/18/2021) 21 tablet 0   torsemide (DEMADEX) 20 MG tablet Take 4 tablets (80 mg total) by mouth 2 (two) times daily.     No current facility-administered medications for this visit.     Past Surgical History:  Procedure Laterality Date   ABDOMINAL HYSTERECTOMY     bilateral knee surgery     CESAREAN SECTION     CHOLECYSTECTOMY  2001   COLONOSCOPY  2007   Dr. Jena Gauss: friable anal canal, few scattered left-sided diverticula, benign colonic biopsies    right ankle  surgery     TONSILLECTOMY     VESICOVAGINAL FISTULA CLOSURE W/ TAH       Allergies  Allergen Reactions   Pedi-Pre Tape Spray [Wound Dressing Adhesive] Rash    White/ leaves a sore spot      Family History  Problem Relation Age of Onset   Cancer Father        Lung    CVA Son    Colon cancer Neg Hx      Social History [name] reports that she has never smoked. She has never used smokeless tobacco. [name] reports no history of alcohol use.   Review of Systems CONSTITUTIONAL: No weight loss, fever, chills, weakness or fatigue.  HEENT: Eyes: No visual loss, blurred vision, double vision or yellow sclerae.No hearing loss, sneezing, congestion, runny nose or sore throat.  SKIN: No rash or itching.  CARDIOVASCULAR: per hpi RESPIRATORY: No shortness of breath, cough or sputum.  GASTROINTESTINAL: No anorexia, nausea, vomiting or diarrhea. No abdominal pain or blood.  GENITOURINARY: No burning on urination, no polyuria NEUROLOGICAL: No headache, dizziness, syncope, paralysis, ataxia, numbness or tingling in the extremities. No change in bowel or bladder control.  MUSCULOSKELETAL: No muscle, back pain, joint pain or stiffness.  LYMPHATICS: No enlarged nodes. No history of splenectomy.  PSYCHIATRIC: No history of depression or anxiety.  ENDOCRINOLOGIC: No reports of sweating, cold or heat intolerance. No polyuria or polydipsia.  Marland Kitchen   Physical Examination Today's Vitals   09/06/21 1134  BP: 122/82  Pulse: 98  SpO2: 94%  Weight: (!) 360 lb (163.3 kg)  Height: 5\' 3"  (1.6 m)   Body mass index is 63.77 kg/m.  Gen: resting comfortably, no acute distress HEENT: no scleral icterus, pupils equal round and reactive, no palptable cervical adenopathy,  CV: irreg Resp: Clear to auscultation bilaterally GI: abdomen is soft, non-tender, non-distended, normal bowel sounds, no hepatosplenomegaly MSK: extremities are warm, trace bilaterla edema  Skin: warm, no rash Neuro:  no  focal deficits Psych: appropriate affect   Diagnostic Studies  07/14/11 Echo: LVEF 50-55%, mild LVH, mod LAE   06/2014 MPI IMPRESSION: 1. No reversible ischemia or infarction. Hypertensive response to stress.   2. Normal left ventricular wall motion.   3. Left ventricular ejection fraction 30%. However, this is likely due to gating abnormalities as echocardiogram on 06/17/14 reported normal LV systolic function, LVEF 55-60%. Function appears grossly normal on  nuclear images.   4. Low-risk stress test findings*.   06/2014 echo Study Conclusions  - Left ventricle: Systolic function was normal. The estimated   ejection fraction was in the range of 55% to 60%. - Aortic valve: Mildly calcified annulus. Trileaflet; mildly   thickened leaflets. Valve area (VTI): 1.52 cm^2. - Mitral valve: Mildly calcified annulus. - Left atrium: The atrium was severely dilated. - Right ventricle: The cavity size was mildly dilated. - Right atrium: The atrium was severely dilated. - Atrial septum: There was a patent foramen ovale. - Technically adequate study.     Assessment and Plan    1. Chronic diastolic -down 15 lbs since last visit, edema has improved - some orthostatic symptoms. Will hold torsemide x 2 days, then start back on torsemide 80mg  just once a day as opposed to bid. Can take additional 80mg  prn weight above 365 lbs. - check bmet/mg in 1 week.    , M.D

## 2021-09-20 ENCOUNTER — Encounter: Payer: Self-pay | Admitting: Neurology

## 2021-09-20 ENCOUNTER — Ambulatory Visit (INDEPENDENT_AMBULATORY_CARE_PROVIDER_SITE_OTHER): Payer: Medicare Other | Admitting: Neurology

## 2021-09-20 ENCOUNTER — Telehealth: Payer: Self-pay | Admitting: Cardiology

## 2021-09-20 VITALS — BP 160/78 | HR 106 | Ht 63.0 in | Wt 371.0 lb

## 2021-09-20 DIAGNOSIS — Z79891 Long term (current) use of opiate analgesic: Secondary | ICD-10-CM

## 2021-09-20 DIAGNOSIS — I5033 Acute on chronic diastolic (congestive) heart failure: Secondary | ICD-10-CM

## 2021-09-20 DIAGNOSIS — Z6841 Body Mass Index (BMI) 40.0 and over, adult: Secondary | ICD-10-CM

## 2021-09-20 DIAGNOSIS — J9621 Acute and chronic respiratory failure with hypoxia: Secondary | ICD-10-CM | POA: Diagnosis not present

## 2021-09-20 DIAGNOSIS — Z7901 Long term (current) use of anticoagulants: Secondary | ICD-10-CM

## 2021-09-20 DIAGNOSIS — I4819 Other persistent atrial fibrillation: Secondary | ICD-10-CM

## 2021-09-20 DIAGNOSIS — E662 Morbid (severe) obesity with alveolar hypoventilation: Secondary | ICD-10-CM

## 2021-09-20 DIAGNOSIS — E1159 Type 2 diabetes mellitus with other circulatory complications: Secondary | ICD-10-CM

## 2021-09-20 DIAGNOSIS — Z9189 Other specified personal risk factors, not elsewhere classified: Secondary | ICD-10-CM | POA: Insufficient documentation

## 2021-09-20 DIAGNOSIS — E119 Type 2 diabetes mellitus without complications: Secondary | ICD-10-CM | POA: Insufficient documentation

## 2021-09-20 DIAGNOSIS — R351 Nocturia: Secondary | ICD-10-CM

## 2021-09-20 DIAGNOSIS — Z9981 Dependence on supplemental oxygen: Secondary | ICD-10-CM | POA: Insufficient documentation

## 2021-09-20 DIAGNOSIS — R0601 Orthopnea: Secondary | ICD-10-CM

## 2021-09-20 MED ORDER — TORSEMIDE 20 MG PO TABS: 80.0000 mg | ORAL_TABLET | Freq: Every day | ORAL | 6 refills | Status: DC

## 2021-09-20 NOTE — Patient Instructions (Signed)
Obesity-Hypoventilation Syndrome Obesity-hypoventilation syndrome (OHS) is a condition in which a person cannot efficiently move air in and out of the lungs (ventilate). This condition causes a buildup of carbon dioxidelevels in the blood and a drop in oxygen levels. OHS can increase the risk for: Cor pulmonale, or right-sided heart failure. Left-sided heart failure. Pulmonary hypertension, or high blood pressure in the arteries in the lungs. Too many red blood cells in the body. Disability or death. What are the causes? This condition may be caused by: Being obese with a BMI (body mass index) greater than or equal to 30 kg/m2. Having too much fat around the abdomen, chest, and neck. The brain being unable to properly manage the high carbon dioxide and low oxygen levels. Hormones made by fat cells. These hormones may interfere with breathing. Sleep apnea. This is when breathing stops, pauses, or is shallow during sleep. What are the signs or symptoms? Symptoms of this condition include: Feeling sleepy during the day. Headaches. These may be worse in the morning. Shortness of breath. Snoring, choking, gasping, or trouble breathing during sleep. Poor concentration or poor memory. Mood changes or feeling irritable. Depression. How is this diagnosed? This condition may be diagnosed by: BMI measurement. Blood tests to measure blood levels of serum bicarbonate, carbon dioxide, and oxygen. Pulse oximetry to measure the amount of oxygen in your blood. This uses a small device that is placed on your finger, earlobe, or toe. Polysomnogram, or sleep study, to check your breathing patterns and levels of oxygen and carbon dioxide while you sleep. You may also have other tests, including: A chest X-ray to rule out other breathing problems. Lung tests, or pulmonary function tests, to rule out other breathing problems. Electrocardiogram (ECG) or echocardiogram to check for signs of heart  failure. How is this treated? This condition may be treated with: A device such as a continuous positive airway pressure (CPAP) machine or a bi-level positive airway pressure (BIPAP) machine. These devices deliver pressure and sometimes oxygen to make breathing easier. A mask may be placed over your nose or mouth. Oxygen if your blood oxygen levels are low. A weight-loss program. Bariatric, or weight-loss, surgery. Tracheostomy. A tube is placed in the windpipe through the neck to help with breathing. Follow these instructions at home:  Medicines Take over-the-counter and prescription medicines only as told by your health care provider. Ask your health care provider what medicines are safe for you. You may be told to avoid medicines such as sedatives and narcotics. These can affect breathing and make OHS worse. Sleeping habits If you are prescribed a CPAP or a BIPAP machine, make sure you understand how to use it. Use your CPAP or BIPAP machine only as told by your health care provider. Try to get at least 8 hours of sleep every night. Eating and drinking  Eat foods that are high in fiber, such as beans, whole grains, and fresh fruits and vegetables. Limit foods that are high in fat and processed sugars, such as fried or sweet foods. Drink enough fluid to keep your urine pale yellow. Do not drink alcohol if: Your health care provider tells you not to drink. You are pregnant, may be pregnant, or are planning to become pregnant. General instructions Follow a diet and exercise plan that helps you reach and keep a healthy weight as told by your health care provider. Exercise regularly as told by your health care provider. Do not use any products that contain nicotine or tobacco. These products   include cigarettes, chewing tobacco, and vaping devices, such as e-cigarettes. If you need help quitting, ask your health care provider. Keep all follow-up visits. This is important. Contact a health  care provider if: You develop new or worsening shortness of breath. You are having trouble waking up or staying awake. You are confused. You develop a cough. You have a fever. Get help right away if: You have chest pain. You have fast or irregular heartbeats. You are dizzy or you faint. You have any symptoms of a stroke. "BE FAST" is an easy way to remember the main warning signs of a stroke: B - Balance. Signs are dizziness, sudden trouble walking, or loss of balance. E - Eyes. Signs are trouble seeing or a sudden change in vision. F - Face. Signs are sudden weakness or numbness of the face, or the face or eyelid drooping on one side. A - Arms. Signs are weakness or numbness in an arm. This happens suddenly and usually on one side of the body. S - Speech. Signs are sudden trouble speaking, slurred speech, or trouble understanding what people say. T - Time. Time to call emergency services. Write down what time symptoms started. You have other signs of a stroke, such as: A sudden, severe headache with no known cause. Nausea or vomiting. Seizure. These symptoms may be an emergency. Get help right away. Call 911. Do not wait to see if the symptoms will go away. Do not drive yourself to the hospital. Summary Obesity-hypoventilation syndrome (OHS) causes a buildup of carbon dioxidelevels in the blood and a drop in oxygen levels. OHS can increase the risk for heart failure, pulmonary hypertension, disability, and death. Follow your diet and exercise plan as told by your health care provider. Get help right away if you have any symptoms of a stroke. This information is not intended to replace advice given to you by your health care provider. Make sure you discuss any questions you have with your health care provider. Document Revised: 10/25/2020 Document Reviewed: 10/25/2020 Elsevier Patient Education  2023 [employer] Sleep Apnea Sleep apnea affects breathing during sleep. It causes  breathing to stop for 10 seconds or more, or to become shallow. People with sleep apnea usually snore loudly. It can also increase the risk of: Heart attack. Stroke. Being very overweight (obese). Diabetes. Heart failure. Irregular heartbeat. High blood pressure. The goal of treatment is to help you breathe normally again. What are the causes?  The most common cause of this condition is a collapsed or blocked airway. There are three kinds of sleep apnea: Obstructive sleep apnea. This is caused by a blocked or collapsed airway. Central sleep apnea. This happens when the brain does not send the right signals to the muscles that control breathing. Mixed sleep apnea. This is a combination of obstructive and central sleep apnea. What increases the risk? Being overweight. Smoking. Having a small airway. Being older. Being female. Drinking alcohol. Taking medicines to calm yourself (sedatives or tranquilizers). Having family members with the condition. Having a tongue or tonsils that are larger than normal. What are the signs or symptoms? Trouble staying asleep. Loud snoring. Headaches in the morning. Waking up gasping. Dry mouth or sore throat in the morning. Being sleepy or tired during the day. If you are sleepy or tired during the day, you may also: Not be able to focus your mind (concentrate). Forget things. Get angry a lot and have mood swings. Feel sad (depressed). Have changes in your personality. Have  less interest in sex, if you are female. Be unable to have an erection, if you are female. How is this treated?  Sleeping on your side. Using a medicine to get rid of mucus in your nose (decongestant). Avoiding the use of alcohol, medicines to help you relax, or certain pain medicines (narcotics). Losing weight, if needed. Changing your diet. Quitting smoking. Using a machine to open your airway while you sleep, such as: An oral appliance. This is a mouthpiece that  shifts your lower jaw forward. A CPAP device. This device blows air through a mask when you breathe out (exhale). An EPAP device. This has valves that you put in each nostril. A BIPAP device. This device blows air through a mask when you breathe in (inhale) and breathe out. Having surgery if other treatments do not work. Follow these instructions at home: Lifestyle Make changes that your doctor recommends. Eat a healthy diet. Lose weight if needed. Avoid alcohol, medicines to help you relax, and some pain medicines. Do not smoke or use any products that contain nicotine or tobacco. If you need help quitting, ask your doctor. General instructions Take over-the-counter and prescription medicines only as told by your doctor. If you were given a machine to use while you sleep, use it only as told by your doctor. If you are having surgery, make sure to tell your doctor you have sleep apnea. You may need to bring your device with you. Keep all follow-up visits. Contact a doctor if: The machine that you were given to use during sleep bothers you or does not seem to be working. You do not get better. You get worse. Get help right away if: Your chest hurts. You have trouble breathing in enough air. You have an uncomfortable feeling in your back, arms, or stomach. You have trouble talking. One side of your body feels weak. A part of your face is hanging down. These symptoms may be an emergency. Get help right away. Call your local emergency services (911 in the [place] Do not wait to see if the symptoms will go away. Do not drive yourself to the hospital. Summary This condition affects breathing during sleep. The most common cause is a collapsed or blocked airway. The goal of treatment is to help you breathe normally while you sleep. This information is not intended to replace advice given to you by your health care provider. Make sure you discuss any questions you have with your health care  provider. Document Revised: 10/26/2020 Document Reviewed: 02/26/2020 Elsevier Patient Education  2023 [employer].

## 2021-09-20 NOTE — Telephone Encounter (Signed)
Contacted to remind of bmet that is due. Says she will do bmet tomorrow

## 2021-09-20 NOTE — Telephone Encounter (Signed)
Patient said she is in need of refill of her Fluid pills torsemide. She has enough for today at 4pm and, in the am. Thank you.

## 2021-09-20 NOTE — Progress Notes (Signed)
SLEEP MEDICINE CLINIC     Provider:  Melvyn Novas, MD  Primary Care Physician:  Marylynn Pearson, FNP [address] [place] [address]     Referring Provider: Juel Burrow, Np [address] [place],  [place] [address]          Chief Complaint according to patient   Patient presents with:     New Patient (Initial Visit) NOT A REFERRAL TO NEUROLOGY- TO SLEEP MEDICINE      Pt with friend, rm 11. Presents today for sleep eval. Averaging about 3 hrs of sleep but states that its not solid. She will wake up and get in her recliner and drift off to sleep. She is on a fluid pill which causes her to have to get up a lot to go to the bathroom. She had a SS several years ago and was dg with sleep apnea. They never followed up with her post SS but years later someone told her she should have been set up with machine but she wasn't. Pt with friend, rm 11. Presents today for sleep eval. Averaging about 3 hrs of sleep but states that its not solid. She will wake up and get in her recliner and drift off to sleep. She is on a fluid pill which causes her to have to get up a lot to go to the bathroom.  She had a SS several years ago at [facility] - and was dg with sleep apnea but had no follow up and never received CPAP- . They never followed up with her post SS but years later someone told her she should have been set up with machine but she wasn't.       HISTORY OF PRESENT ILLNESS:  Jasmin Martin is a 66 y.o.  Caucasian female patient is seen here with her former spouse  on 09-20-2021 for a sleep consultation- as a referral on 09/20/2021 from PCP at [facility].  Chief concern according to patient :  I never was given a CPAP but I know I have apnea- and have atrial fib, CHF, morbid obesity - super-obesity, borderline DM, had recent Echo and was told she needs to see a sleep doctor. I have to take 80 mg HCTZ twice a day.     Jasmin Martin   has a past medical  history of Arthritis, Atrial fibrillation (HCC), Bilateral knee pain, CHF (congestive heart failure) (HCC), Depression, DJD (degenerative joint disease), HTN (hypertension), Insomnia, Obesity, morbid (HCC), Persistent atrial fibrillation (HCC), and Renal insufficiency.  1)Chronic diastolic HF - admit 09/2020 with acute on chronic diastolic HF - - 09/2020 LVE 55-60%, indet diastolic function, normal RV - BNP 161, CXR no acute process. Reported 20 lbs weight gain over 4 months - wt 177 kg on admission(389 lbs), was 372 lbs in 05/2020  2)- over last month increased LE edema, SOB/DOE. +PND - usually weight low 360s, today up to 375 lbs - taking torsemide 60mg  daily and 40mg  for last few weeks, fluid not improving.  2b) . Mild aortic stenosis - Aortic valve mean gradient measures 13.0 mmHg. Aortic valve peak gradient measures 25.0  mmHg. Aortic valve area, by VTI measures 1.41 cm.    3)Chronic respiraotory failure - on home O2 2L but not on CPAP-    The patient had the first sleep study in the year 2010- or thereabouts .    Sleep relevant medical history: Nocturia - 5  times, had a Tonsillectomy, sinus surgery with Dr Ernesto Rutherford,  deviated septum .     Family medical /sleep history: NO other family member on CPAP with OSA, insomnia, sleep walkers.    Social history:  Patient is retired from a Copy, on [employer]- and lives in a household alone. Family status is divorced , with one deceased son , died in Aug 02, 2016, raised 2 granddaughters.   Tobacco use; none- .  ETOH use ; rare ,  Caffeine intake in form of Coffee( /) Soda( /) Tea ( /) or energy drinks. ?exercise in form of ":very little ".      Sleep habits are as follows: The patient's dinner time is between 4-5 PM. The patient goes to bed at 12-1 AM  and reports a racing mind, insomnia, anxiety, worried- and continues to sleep for 3-4 hours, wakes for many, many bathroom breaks, the first time at 2 AM. She changes to sleep in her  recliner and gets another 1-2 hours of sleep, very fragmented.    The preferred sleep position is reclined, with the support of 2-3 pillows or raised bed-.  Dreams are reportedly frequent/vivid.  Very lucid- hallucinatory. 6  AM is the usual rise time. The patient wakes up spontaneously.  She reports not feeling refreshed or restored in AM, with symptoms such as dry mouth,  and residual fatigue.  Naps are taken frequently, lasting from 20-30  minutes and are  refreshing.  Review of Systems: Out of a complete 14 system review, the patient complains of only the following symptoms, and all other reviewed systems are negative.:  severe insomnia, worse since son died.  NOCTURIA  Sleep attacks. Vivid dreams, traumatic.  Reclined position, snoring. Witnessed apnea.  Fatigue, sleepiness , snoring, fragmented sleep, Insomnia    How likely are you to doze in the following situations: 0 = not likely, 1 = slight chance, 2 = moderate chance, 3 = high chance   Sitting and Reading?3 Watching Television?3 Sitting inactive in a public place (theater or meeting)? As a passenger in a car for an hour without a break? Lying down in the afternoon when circumstances permit? Sitting and talking to someone?2 Sitting quietly after lunch without alcohol?3 In a car, while stopped for a few minutes in traffic?0   Total =14 / 24 points   FSS endorsed at 49/ 63 points.   Social History   Socioeconomic History   Marital status: Divorced    Spouse name: Not on file   Number of children: Not on file   Years of education: Not on file   Highest education level: Not on file  Occupational History   Not on file  Tobacco Use   Smoking status: Never   Smokeless tobacco: Never   Tobacco comments:    Never smoked  Vaping Use   Vaping Use: Never used  Substance and Sexual Activity   Alcohol use: No    Alcohol/week: 0.0 standard drinks of alcohol   Drug use: No   Sexual activity: Yes    Birth  control/protection: Surgical  Other Topics Concern   Not on file  Social History Narrative   Not on file   Social Determinants of Health   Financial Resource Strain: Not on file  Food Insecurity: Not on file  Transportation Needs: Not on file  Physical Activity: Not on file  Stress: Not on file  Social Connections: Not on file    Family History  Problem Relation Age of Onset  Cancer Father        Lung    CVA Son    Colon cancer Neg Hx     Past Medical History:  Diagnosis Date   Arthritis    Atrial fibrillation (HCC)    a. on Eliquis   Bilateral knee pain    CHF (congestive heart failure) (HCC)    Depression    DJD (degenerative joint disease)    HTN (hypertension)    Insomnia    Obesity, morbid (HCC)    Persistent atrial fibrillation (HCC)    Renal insufficiency     Past Surgical History:  Procedure Laterality Date   ABDOMINAL HYSTERECTOMY     bilateral knee surgery     CESAREAN SECTION     CHOLECYSTECTOMY  2001   COLONOSCOPY  2007   Dr. Jena Gauss: friable anal canal, few scattered left-sided diverticula, benign colonic biopsies    right ankle surgery     TONSILLECTOMY     VESICOVAGINAL FISTULA CLOSURE W/ TAH       Current Outpatient Medications on File Prior to Visit  Medication Sig Dispense Refill   albuterol (VENTOLIN HFA) 108 (90 Base) MCG/ACT inhaler Inhale 2 puffs into the lungs every 6 (six) hours as needed for wheezing or shortness of breath.     allopurinol (ZYLOPRIM) 300 MG tablet TAKE 1 TABLET BY MOUTH ONCE DAILY. 30 tablet 5   apixaban (ELIQUIS) 5 MG TABS tablet Take 5 mg by mouth 2 (two) times daily.     diltiazem (CARDIZEM CD) 300 MG 24 hr capsule TAKE ONE CAPSULE BY MOUTH ONCE DAILY. 90 capsule 3   gabapentin (NEURONTIN) 100 MG capsule Take 100 mg by mouth 3 (three) times daily as needed.     hydrOXYzine (ATARAX/VISTARIL) 25 MG tablet Take 25 mg by mouth every 8 (eight) hours as needed.     metFORMIN (GLUCOPHAGE) 500 MG tablet Take 500 mg by  mouth every morning.     methocarbamol (ROBAXIN) 500 MG tablet Take 500 mg by mouth 2 (two) times daily as needed.     metoprolol (TOPROL-XL) 200 MG 24 hr tablet TAKE (1) TABLET BY MOUTH ONCE DAILY. 90 tablet 1   montelukast (SINGULAIR) 10 MG tablet Take 10 mg by mouth daily.     Oxycodone HCl 10 MG TABS Take 10 mg by mouth every 6 (six) hours as needed.     OXYGEN Inhale 2 L into the lungs daily.     potassium chloride SA (KLOR-CON) 20 MEQ tablet Take 1 tablet (20 mEq total) by mouth daily. 30 tablet 1   pravastatin (PRAVACHOL) 40 MG tablet Take 1 tablet (40 mg total) by mouth daily. 30 tablet 6   torsemide (DEMADEX) 20 MG tablet Take 4 tablets (80 mg total) by mouth daily.     No current facility-administered medications on file prior to visit.    Allergies  Allergen Reactions   Pedi-Pre Tape Spray [Wound Dressing Adhesive] Rash    White/ leaves a sore spot    Physical exam:  Today's Vitals   09/20/21 1104  BP: (!) 160/78  Pulse: (!) 106  SpO2: 95%  Weight: (!) 371 lb (168.3 kg)  Height: 5\' 3"  (1.6 m)   Body mass index is 65.72 kg/m.   Wt Readings from Last 3 Encounters:  09/20/21 (!) 371 lb (168.3 kg)  09/06/21 (!) 360 lb (163.3 kg)  08/18/21 (!) 375 lb 3.2 oz (170.2 kg)     Ht Readings from Last 3 Encounters:  09/20/21  5\' 3"  (1.6 m)  09/06/21 5\' 3"  (1.6 m)  08/18/21 5\' 4"  (1.626 m)      General: The patient is awake, alert and appears not in acute distress. The patient is well groomed. Head: Normocephalic, atraumatic.  Neck is supple. Mallampati: 1- ,  neck circumference:20.5 inches !!! . Nasal airflow  patent.  short of breath while speaking- Retrognathia is not seen.  Dental status: biological, irregular  Cardiovascular:  Regular rate and cardiac rhythm by pulse,  without distended neck veins. Respiratory: Lungs are clear to auscultation.  Skin:  Without evidence of ankle edema, or rash. Trunk: The patient's posture is erect.   Neurologic exam : The  patient is awake and alert, oriented to place and time.   Memory subjective described as intact.  Attention span & concentration ability appears normal.  Speech is fluent, with dysphonia or aphasia.  Mood and affect are appropriate.   Cranial nerves: no loss of smell or taste reported  Pupils are equal and briskly reactive to light. Funduscopic exam deferred..  Extraocular movements in vertical and horizontal planes were intact and without nystagmus. No Diplopia. Visual fields by finger perimetry are intact. Hearing was intact to soft voice and finger rubbing.    Facial sensation intact to fine touch.  Facial motor strength is symmetric and tongue and uvula move midline.  Neck ROM : rotation, tilt and flexion extension were normal for age and shoulder shrug was symmetrical.    Motor exam:  Symmetric bulk, tone and ROM.   Normal tone without cog wheeling, symmetric grip strength .   Sensory: deferred.  Coordination: Rapid alternating movements in the fingers/hands were of normal speed.  The Finger-to-nose maneuver was intact without evidence of ataxia, dysmetria or tremor. Gait and station: Patient  walked with a  4 prong cane for  assistive device.  Stance is of wider  width/ base and the patient turned with 6  steps.  Deep tendon reflexes:  deferred .       After spending a total time of  45  minutes face to face and additional time for physical and neurologic examination, review of laboratory studies,  personal review of imaging studies, reports and results of other testing and review of referral information / records as far as provided in visit, I have established the following assessments:  1) extreme risk factors for OSA and also for CSA- she uses nocturnal oxygen prescribed by [facility] , started by dr Juanetta Gosling 5 years ago.  2) previously dx with sleep apnea- no records found.  3)  CHF diastolic heart failure, respiratory failure, CHF. atrial fib persistent and superobesity.  On OXYCODONE per Dr. Juanetta Gosling in an untreated sleep apnea patient with hypoxia. (!) HIGH RISK OF CSA>    My Plan is to proceed with:  1) 14 6.avs Social Jasmin Martin has a remarkable set of risk factors for obstructive as well as for central sleep apnea and given her history of diastolic heart failure, congestive heart failure, chronic persistent atrial fibrillation and oxygen need at night it is concerning that she would be on an opiate pain medication.  She is also on a fairly high dose of hydrochlorothiazide causing frequent nocturia interrupting her little sleep even more.  She remains with ankle edema.  She is diabetic her hemoglobin A1c is 7 so this is clearly a diagnosis of diabetes.  Her bad cholesterol the LDL is 122 so that is too high but she does have a good higher HDL  cholesterol to balance this.  Her triglycerides are high and these are usually induced by starch and sugar intake.  She presents with a hyper natremia of 145 as of April 28, 2021 her creatinine was 0.82 glomerular filtration rate was normal with rate at 7.8.  Her fasting glucose was 132 much too high.    I am very worried that she is considering her body mass index her medication and her medical history at high risk of having obesity hypoventilation, opiate induced hypoventilation or central apnea and given that she is already on oxygen I do suspect that she has at least a complex sleep apnea with some obstruction as well.  I can only treat this patient if she comes in for an attended sleep study.  Her conditions do not allow a home sleep test.  I would allow the technician to split the study if an AHI of 40 is reached or an oxygen nadir without oxygen being added of 75.  At that time we will see if we can with positive airway pressure overcome any oxygen needs.  If this is not possible then we will have to add oxygen again.  She is currently using 2 L at night per minute nasal cannula.   I would like to thank Denyce Robert,  FNP , Dr Harl Bowie, and [place], [address],  [place] [address] for allowing me to meet with and to take care of this pleasant patient.   In short, Jasmin Martin is  to follow up either personally or through our NP within 3 months.   CC: I will share my notes with PCP.   Electronically signed by: Larey Seat, MD 09/20/2021 11:15 AM  [facility] Board certified by The [employer] of Sleep Medicine and Diplomate of the [employer] of Sleep Medicine. Board certified In Neurology through the [place], Fellow of the [employer] of Neurology. Medical Director of [facility].

## 2021-09-21 ENCOUNTER — Telehealth: Payer: Self-pay | Admitting: Neurology

## 2021-09-21 NOTE — Telephone Encounter (Signed)
LVM for pt to call back to schedule    [employer] medicare no auth req  

## 2021-09-21 NOTE — Telephone Encounter (Signed)
Patient called me back, she stated that the Doctor told her to wait until the new equipment comes in to do the sleep study. I informed the patient that once the new equipment gets set up I will give her a call to schedule.

## 2021-09-26 ENCOUNTER — Other Ambulatory Visit (HOSPITAL_COMMUNITY)
Admission: RE | Admit: 2021-09-26 | Discharge: 2021-09-26 | Disposition: A | Discharge status: Home / Self Care | Payer: Medicare Other | Source: Ambulatory Visit | Attending: Cardiology | Admitting: Cardiology

## 2021-09-26 DIAGNOSIS — E662 Morbid (severe) obesity with alveolar hypoventilation: Secondary | ICD-10-CM | POA: Insufficient documentation

## 2021-09-26 DIAGNOSIS — Z6841 Body Mass Index (BMI) 40.0 and over, adult: Secondary | ICD-10-CM | POA: Diagnosis present

## 2021-09-26 DIAGNOSIS — Z79899 Other long term (current) drug therapy: Secondary | ICD-10-CM | POA: Insufficient documentation

## 2021-09-26 DIAGNOSIS — Z79891 Long term (current) use of opiate analgesic: Secondary | ICD-10-CM | POA: Diagnosis present

## 2021-09-26 DIAGNOSIS — I4819 Other persistent atrial fibrillation: Secondary | ICD-10-CM | POA: Insufficient documentation

## 2021-09-26 DIAGNOSIS — J9621 Acute and chronic respiratory failure with hypoxia: Secondary | ICD-10-CM | POA: Insufficient documentation

## 2021-09-26 DIAGNOSIS — R0601 Orthopnea: Secondary | ICD-10-CM | POA: Diagnosis present

## 2021-09-26 DIAGNOSIS — Z9189 Other specified personal risk factors, not elsewhere classified: Secondary | ICD-10-CM | POA: Insufficient documentation

## 2021-09-26 DIAGNOSIS — Z9981 Dependence on supplemental oxygen: Secondary | ICD-10-CM | POA: Insufficient documentation

## 2021-09-26 DIAGNOSIS — I5033 Acute on chronic diastolic (congestive) heart failure: Secondary | ICD-10-CM | POA: Insufficient documentation

## 2021-09-26 LAB — BASIC METABOLIC PANEL
Anion gap: 12 (ref 5–15)
BUN: 13 mg/dL (ref 8–23)
CO2: 34 mmol/L — ABNORMAL HIGH (ref 22–32)
Calcium: 8.6 mg/dL — ABNORMAL LOW (ref 8.9–10.3)
Chloride: 94 mmol/L — ABNORMAL LOW (ref 98–111)
Creatinine, Ser: 0.81 mg/dL (ref 0.44–1.00)
GFR, Estimated: >60 mL/min (ref >60)
Glucose, Bld: 144 mg/dL — ABNORMAL HIGH (ref 70–99)
Potassium: 3.3 mmol/L — ABNORMAL LOW (ref 3.5–5.1)
Sodium: 140 mmol/L (ref 135–145)

## 2021-09-26 LAB — MAGNESIUM: Magnesium: 1.4 mg/dL — ABNORMAL LOW (ref 1.7–2.4)

## 2021-10-09 ENCOUNTER — Telehealth: Payer: Self-pay

## 2021-10-09 MED ORDER — POTASSIUM CHLORIDE CRYS ER 20 MEQ PO TBCR: 40.0000 meq | EXTENDED_RELEASE_TABLET | Freq: Every day | ORAL | 3 refills | Status: DC

## 2021-10-09 MED ORDER — MAGNESIUM OXIDE 400 MG PO CAPS: 400.0000 mg | ORAL_CAPSULE | ORAL | 2 refills | Status: DC

## 2021-10-09 NOTE — Telephone Encounter (Signed)
-----   Message from Antoine Poche, MD sent at 10/08/2021 11:18 AM EDT ----- Potassium low, would increase daily potasssium chloride to daily. Magnesium low, would take magnesium oxided 400mg  bid x 4 days then 400mg  daily  MD

## 2021-10-09 NOTE — Telephone Encounter (Signed)
Labs discussed with patient and she agrees to medication changes   E-scribed to [facility]

## 2021-10-11 ENCOUNTER — Telehealth: Payer: Self-pay | Admitting: Cardiology

## 2021-10-11 MED ORDER — MAGNESIUM OXIDE 400 MG PO CAPS: 400.0000 mg | ORAL_CAPSULE | ORAL | 2 refills | Status: AC

## 2021-10-11 NOTE — Telephone Encounter (Signed)
Patient called and, said she went to [facility] to pick up her prescriptions she was able to get the potassium chloride. [facility] didn't receive the prescription order for the magnesium oxide 40mg  is what she was told when she arrived at the pharmacy.

## 2021-10-11 NOTE — Telephone Encounter (Signed)
Resent Mag-Ox 400 mg tablets to the pharmacy per pt's request. Called pt to let her know prescription was sent in again. Pt had no other questions or concerns at this time.

## 2021-11-16 NOTE — Telephone Encounter (Signed)
Split- [employer] medicare no auth req.  Patient is scheduled at [facility] for 12/12/21 at 9 pm.  Mailed packet and email patient.

## 2021-11-25 ENCOUNTER — Other Ambulatory Visit: Payer: Self-pay | Admitting: Cardiology

## 2021-12-13 ENCOUNTER — Telehealth: Payer: Self-pay | Admitting: Orthopaedic Surgery

## 2021-12-19 NOTE — Telephone Encounter (Signed)
Noted, spoke with the patient she is r/s for 01/15/22 at 8 pm.  Sent patient an email and mailed the packet to the patient.

## 2021-12-19 NOTE — Telephone Encounter (Signed)
Called and spoke w/ pt. Both her granddaughters work early morning and were not able to bring her to sleep study at our office. She had a 45 min drive and was concerned about coming by herself. I explained that I spoke w/ Dr. Brett Fairy and she said we cannot do HST on her since we need to evaluate need for oxygen.  Pt agreeable to reschedule. Aware I will send back to [facility] in sleep lab to reschedule.

## 2021-12-19 NOTE — Telephone Encounter (Signed)
Patient no showed her 12/12/21 appt at [facility] because she was so upset from having the thoughts of having to drive up here.  She asked me if she can do a home sleep study. Can you put an HST order in. Thank you.

## 2021-12-27 NOTE — Telephone Encounter (Signed)
Due to tech out Patient is r/s for 02/01/22 at 8 pm.  Mailed new packet to the patient.

## 2022-01-19 ENCOUNTER — Ambulatory Visit: Payer: Medicare Other | Attending: Cardiology | Admitting: Cardiology

## 2022-01-19 ENCOUNTER — Encounter: Payer: Self-pay | Admitting: Cardiology

## 2022-01-19 VITALS — BP 110/82 | HR 86 | Ht 63.0 in | Wt 361.6 lb

## 2022-01-19 DIAGNOSIS — I10 Essential (primary) hypertension: Secondary | ICD-10-CM

## 2022-01-19 DIAGNOSIS — Z79899 Other long term (current) drug therapy: Secondary | ICD-10-CM | POA: Diagnosis not present

## 2022-01-19 DIAGNOSIS — I4891 Unspecified atrial fibrillation: Secondary | ICD-10-CM

## 2022-01-19 DIAGNOSIS — D6869 Other thrombophilia: Secondary | ICD-10-CM

## 2022-01-19 DIAGNOSIS — I5032 Chronic diastolic (congestive) heart failure: Secondary | ICD-10-CM | POA: Diagnosis not present

## 2022-01-19 NOTE — Patient Instructions (Addendum)
Medication Instructions:  Continue all current medications.  Labwork: BMET, Mg - orders given today Office will contact with results via phone, letter or mychart.     Testing/Procedures: none  Follow-Up: 6 months   Any Other Special Instructions Will Be Listed Below (If Applicable).   If you need a refill on your cardiac medications before your next appointment, please call your pharmacy.  

## 2022-01-19 NOTE — Progress Notes (Signed)
Clinical Summary Jasmin Martin is a 66 y.o.female seen today for follow up of the following medical problems.   1. Chronic diastolic HF - admit 0000000 with acute on chronic diastolic HF - - 0000000 LVE 0000000, indet diastolic function, normal RV -07/2021 echo LVEF [id number], indet diastolic, normal RV  - home weights typically 365 and under which is stable and her baseline.   - taking torsemide 80mg  daily, will take extra as needed    2. Aortic stenosis 07/2021 echo mean grad 10, AVA VTI 1.22, SVI 22     3. Afib - no symptoms, compliant with meds   - palpitations at times, 1--2 times per week. Resolves with rest. Overall not bothersome - no bleeding eliquis   4. HTN - she is compliant with meds   5. Nocturnal hypoxia - uses home O2 at night, reports negative sleep study for OSA several years ago.  - +snoring, no apneic episodes, +daytime somnolence   - upcoming sleep apnea test Nov 2nd, followed by Dr Brett Fairy.        SH: son passed away February 23, 2017 Past Medical History:  Diagnosis Date   Arthritis    Atrial fibrillation ([place])    a. on Eliquis   Bilateral knee pain    CHF (congestive heart failure) (HCC)    Depression    DJD (degenerative joint disease)    HTN (hypertension)    Insomnia    Obesity, morbid (HCC)    Persistent atrial fibrillation (HCC)    Renal insufficiency      Allergies  Allergen Reactions   Pedi-Pre Tape Spray [Wound Dressing Adhesive] Rash    White/ leaves a sore spot     Current Outpatient Medications  Medication Sig Dispense Refill   albuterol (VENTOLIN HFA) 108 (90 Base) MCG/ACT inhaler Inhale 2 puffs into the lungs every 6 (six) hours as needed for wheezing or shortness of breath.     allopurinol (ZYLOPRIM) 300 MG tablet TAKE 1 TABLET BY MOUTH ONCE DAILY. 30 tablet 5   apixaban (ELIQUIS) 5 MG TABS tablet Take 5 mg by mouth 2 (two) times daily.     diltiazem (CARDIZEM CD) 300 MG 24 hr capsule TAKE ONE CAPSULE BY MOUTH ONCE DAILY. 90  capsule 3   gabapentin (NEURONTIN) 100 MG capsule Take 100 mg by mouth 3 (three) times daily as needed.     hydrOXYzine (ATARAX/VISTARIL) 25 MG tablet Take 25 mg by mouth every 8 (eight) hours as needed.     Magnesium Oxide 400 MG CAPS Take 1 capsule (400 mg total) by mouth as directed. 94 capsule 2   metFORMIN (GLUCOPHAGE) 500 MG tablet Take 500 mg by mouth every morning.     methocarbamol (ROBAXIN) 500 MG tablet Take 500 mg by mouth 2 (two) times daily as needed.     metoprolol (TOPROL-XL) 200 MG 24 hr tablet TAKE (1) TABLET BY MOUTH ONCE DAILY. 90 tablet 1   montelukast (SINGULAIR) 10 MG tablet Take 10 mg by mouth daily.     Oxycodone HCl 10 MG TABS Take 10 mg by mouth every 6 (six) hours as needed.     OXYGEN Inhale 2 L into the lungs daily.     potassium chloride SA (KLOR-CON M20) 20 MEQ tablet Take 2 tablets (40 mEq total) by mouth daily. 180 tablet 3   pravastatin (PRAVACHOL) 40 MG tablet Take 1 tablet (40 mg total) by mouth daily. 30 tablet 6   torsemide (DEMADEX) 20 MG tablet Take  4 tablets (80 mg total) by mouth daily. Can take additional 80mg  as needed for weight above 365 lbs. 240 tablet 6   No current facility-administered medications for this visit.     Past Surgical History:  Procedure Laterality Date   ABDOMINAL HYSTERECTOMY     bilateral knee surgery     CESAREAN SECTION     CHOLECYSTECTOMY  2001   COLONOSCOPY  2007   Dr. Gala Romney: friable anal canal, few scattered left-sided diverticula, benign colonic biopsies    right ankle surgery     TONSILLECTOMY     VESICOVAGINAL FISTULA CLOSURE W/ TAH       Allergies  Allergen Reactions   Pedi-Pre Tape Spray [Wound Dressing Adhesive] Rash    White/ leaves a sore spot      Family History  Problem Relation Age of Onset   Cancer Father        Lung    CVA Son    Colon cancer Neg Hx      Social History Ms. Casel reports that she has never smoked. She has never used smokeless tobacco. Ms. Foran reports no history  of alcohol use.   Review of Systems CONSTITUTIONAL: No weight loss, fever, chills, weakness or fatigue.  HEENT: Eyes: No visual loss, blurred vision, double vision or yellow sclerae.No hearing loss, sneezing, congestion, runny nose or sore throat.  SKIN: No rash or itching.  CARDIOVASCULAR: per hpi RESPIRATORY: No shortness of breath, cough or sputum.  GASTROINTESTINAL: No anorexia, nausea, vomiting or diarrhea. No abdominal pain or blood.  GENITOURINARY: No burning on urination, no polyuria NEUROLOGICAL: No headache, dizziness, syncope, paralysis, ataxia, numbness or tingling in the extremities. No change in bowel or bladder control.  MUSCULOSKELETAL: No muscle, back pain, joint pain or stiffness.  LYMPHATICS: No enlarged nodes. No history of splenectomy.  PSYCHIATRIC: No history of depression or anxiety.  ENDOCRINOLOGIC: No reports of sweating, cold or heat intolerance. No polyuria or polydipsia.  Marland Kitchen   Physical Examination Today's Vitals   01/19/22 1339  BP: 110/82  Pulse: 86  SpO2: 91%  Weight: (!) 361 lb 9.6 oz (164 kg)  Height: 5\' 3"  (1.6 m)   Body mass index is 64.05 kg/m.  Gen: resting comfortably, no acute distress HEENT: no scleral icterus, pupils equal round and reactive, no palptable cervical adenopathy,  CV: irreg, 2/6 systolic murmur rusb, no jvd Resp: Clear to auscultation bilaterally GI: abdomen is soft, non-tender, non-distended, normal bowel sounds, no hepatosplenomegaly MSK: extremities are warm, trace bilateral edema  Skin: warm, no rash Neuro:  no focal deficits Psych: appropriate affect   Diagnostic Studies  07/14/11 Echo: LVEF 50-55%, mild LVH, mod LAE   06/2014 MPI IMPRESSION: 1. No reversible ischemia or infarction. Hypertensive response to stress.   2. Normal left ventricular wall motion.   3. Left ventricular ejection fraction 30%. However, this is likely due to gating abnormalities as echocardiogram on 06/17/14 reported normal LV systolic  function, LVEF 0000000. Function appears grossly normal on nuclear images.   4. Low-risk stress test findings*.   06/2014 echo Study Conclusions  - Left ventricle: Systolic function was normal. The estimated   ejection fraction was in the range of 55% to 60%. - Aortic valve: Mildly calcified annulus. Trileaflet; mildly   thickened leaflets. Valve area (VTI): 1.52 cm^2. - Mitral valve: Mildly calcified annulus. - Left atrium: The atrium was severely dilated. - Right ventricle: The cavity size was mildly dilated. - Right atrium: The atrium was severely dilated. - Atrial  septum: There was a patent foramen ovale. - Technically adequate study.       Assessment and Plan    1. Chronic diastolic -no significant edema, she is at her baseline weight based on home scales - continue current diuretic dosing  2. Afib/acquired thrombophilia - infrequent symptmos, continue current meds. Room to titrate dilt if needed - continue eliquis for stroke prevention  3.Aortic stenosis - mild to moderate, continue to monitor  4. HTN  At goal, continue current meds   Arnoldo Lenis, M.D.

## 2022-01-31 ENCOUNTER — Other Ambulatory Visit: Payer: Self-pay | Admitting: *Deleted

## 2022-01-31 MED ORDER — APIXABAN 5 MG PO TABS: 5.0000 mg | ORAL_TABLET | Freq: Two times a day (BID) | ORAL | 3 refills | Status: DC

## 2022-02-05 NOTE — Telephone Encounter (Signed)
Patient called the after hours on Thursday 02/01/22 only hours before her appointment. Patient was told she will be charged the $250 cancellation few this time if she wants to r/s. Patient will call us when she is ready.

## 2022-02-08 ENCOUNTER — Encounter: Payer: Self-pay | Admitting: *Deleted

## 2022-02-12 ENCOUNTER — Other Ambulatory Visit: Payer: Self-pay | Admitting: *Deleted

## 2022-02-15 ENCOUNTER — Telehealth: Payer: Self-pay | Admitting: *Deleted

## 2022-02-15 NOTE — Telephone Encounter (Signed)
Received fax from [facility] - approved 02/14/2022 through 04/01/2022.

## 2022-05-02 ENCOUNTER — Other Ambulatory Visit: Payer: Self-pay | Admitting: Cardiology

## 2022-05-30 ENCOUNTER — Telehealth: Payer: Self-pay | Admitting: Orthopaedic Surgery

## 2022-05-31 ENCOUNTER — Encounter: Payer: Self-pay | Admitting: Radiology

## 2022-06-21 ENCOUNTER — Other Ambulatory Visit: Payer: Self-pay | Admitting: Cardiology

## 2022-07-04 ENCOUNTER — Ambulatory Visit: Payer: Medicare Other | Admitting: Cardiology

## 2022-07-20 ENCOUNTER — Ambulatory Visit: Payer: Medicare Other | Attending: Nurse Practitioner | Admitting: Nurse Practitioner

## 2022-07-20 ENCOUNTER — Encounter: Payer: Self-pay | Admitting: Nurse Practitioner

## 2022-07-20 VITALS — BP 110/78 | HR 70 | Ht 63.0 in | Wt 346.0 lb

## 2022-07-20 DIAGNOSIS — I38 Endocarditis, valve unspecified: Secondary | ICD-10-CM | POA: Diagnosis not present

## 2022-07-20 DIAGNOSIS — I10 Essential (primary) hypertension: Secondary | ICD-10-CM

## 2022-07-20 DIAGNOSIS — I5032 Chronic diastolic (congestive) heart failure: Secondary | ICD-10-CM | POA: Diagnosis not present

## 2022-07-20 DIAGNOSIS — I350 Nonrheumatic aortic (valve) stenosis: Secondary | ICD-10-CM

## 2022-07-20 DIAGNOSIS — G4734 Idiopathic sleep related nonobstructive alveolar hypoventilation: Secondary | ICD-10-CM

## 2022-07-20 DIAGNOSIS — I4819 Other persistent atrial fibrillation: Secondary | ICD-10-CM

## 2022-07-20 NOTE — Patient Instructions (Addendum)
Medication Instructions:  Your physician recommends that you continue on your current medications as directed. Please refer to the Current Medication list given to you today.  *If you need a refill on your cardiac medications before your next appointment, please call your pharmacy*   Lab Work: None If you have labs (blood work) drawn today and your tests are completely normal, you will receive your results only by: MyChart Message (if you have MyChart) OR A paper copy in the mail If you have any lab test that is abnormal or we need to change your treatment, we will call you to review the results.   Testing/Procedures: Your physician has requested that you have an echocardiogram. Echocardiography is a painless test that uses sound waves to create images of your heart. It provides your doctor with information about the size and shape of your heart and how well your heart's chambers and valves are working. This procedure takes approximately one hour. There are no restrictions for this procedure. Please do NOT wear cologne, perfume, aftershave, or lotions (deodorant is allowed). Please arrive 15 minutes prior to your appointment time.    Follow-Up: At [facility], you and your health needs are our priority.  As part of our continuing mission to provide you with exceptional heart care, we have created designated Provider Care Teams.  These Care Teams include your primary Cardiologist (physician) and Advanced Practice Providers (APPs -  Physician Assistants and Nurse Practitioners) who all work together to provide you with the care you need, when you need it.  We recommend signing up for the patient portal called "MyChart".  Sign up information is provided on this After Visit Summary.  MyChart is used to connect with patients for Virtual Visits (Telemedicine).  Patients are able to view lab/test results, encounter notes, upcoming appointments, etc.  Non-urgent messages can be sent to your  provider as well.   To learn more about what you can do with MyChart, go to [email or web address].    Your next appointment:   6 month(s)  Provider:   Dina Rich, MD    Other Instructions  [email or web address] [email or web address]

## 2022-07-20 NOTE — Progress Notes (Signed)
Office Visit    Patient Name: Jasmin Martin Date of Encounter: 07/20/2022  PCP:  Aliene Beams, MD   [facility]  Cardiologist:  Dina Rich, MD  Advanced Practice Provider:  No care team member to display Electrophysiologist:  None   Chief Complaint    Jasmin Martin is a 67 y.o. female with a hx of chronic diastolic CHF, A-fib, hypertension, aortic valve stenosis, renal insufficiency, nocturnal hypoxia, and obesity, who presents today for 87-month follow-up.  Past Medical History    Past Medical History:  Diagnosis Date   Arthritis    Atrial fibrillation    a. on Eliquis   Bilateral knee pain    CHF (congestive heart failure)    Depression    DJD (degenerative joint disease)    HTN (hypertension)    Insomnia    Obesity, morbid    Persistent atrial fibrillation    Renal insufficiency    Past Surgical History:  Procedure Laterality Date   ABDOMINAL HYSTERECTOMY     bilateral knee surgery     CESAREAN SECTION     CHOLECYSTECTOMY  2001   COLONOSCOPY  2007   Dr. Jena Gauss: friable anal canal, few scattered left-sided diverticula, benign colonic biopsies    right ankle surgery     TONSILLECTOMY     VESICOVAGINAL FISTULA CLOSURE W/ TAH      Allergies  Allergies  Allergen Reactions   Pedi-Pre Tape Spray [Wound Dressing Adhesive] Rash    White/ leaves a sore spot    History of Present Illness    Jasmin Martin is a 67 y.o. female with a PMH as mentioned above.  Echocardiogram in May 2023 revealed EF 65 to 70%, indeterminate diastolic function, normal RV.  Mild aortic valve stenosis noted with mean gradient along aortic valve 10 mmHg, overall stable findings from previous study.  Last seen by Dr. Dina Rich in January 19, 2022.  Weight was stable.  Was overall doing well at the time.  Had upcoming sleep apnea test in November.  Today she presents for 44-month follow-up.  She states she is doing well. Recently started [facility] and is  doing well on it. Changing her diet. Weight is down almost 20 lbs since October 2023. Denies any chest pain, shortness of breath, syncope, presyncope, dizziness, orthopnea, PND, swelling or significant weight changes, acute bleeding, or claudication. Wears 2 liters of oxygen, used to wear only at PM for 5-6 years, now wearing oxygen continuously. Has never seen pulmonology. Notes occasional infrequent palpitations, brief, not bothersome per her report.   SH: She has loved doing [employer] in her free time.   EKGs/Labs/Other Studies Reviewed:   The following studies were reviewed today:   EKG:  EKG is ordered today.  The ekg ordered today demonstrates Afib, 66 bpm, nonspecific ST/T wave abnormality, no acute ischemic changes.   Echo 07/2021:   1. Left ventricular ejection fraction, by estimation, is 65 to 70%. Left  ventricular ejection fraction by PLAX is 66 %. The left ventricle has  normal function. The left ventricle has no regional wall motion  abnormalities. There is mild left ventricular  hypertrophy. Left ventricular diastolic function could not be evaluated.   2. Right ventricular systolic function is normal. The right ventricular  size is normal. Tricuspid regurgitation signal is inadequate for assessing  PA pressure.   3. Left atrial size was moderately dilated.   4. Right atrial size was severely dilated.   5. The  pericardial effusion is circumferential.   6. The mitral valve is abnormal. Trivial mitral valve regurgitation.   7. Tricuspid valve regurgitation is mild to moderate.   8. The aortic valve is calcified. Aortic valve regurgitation is not  visualized. Mild aortic valve stenosis. Aortic valve area, by VTI measures  1.22 cm. Aortic valve mean gradient measures 10.0 mmHg. Aortic valve Vmax  measures 2.06 m/s. DI is 0.43.   Comparison(s): Changes from prior study are noted. 10/12/2020: LVEF 55-60%,  mild AS - mean gradient 13 mmHg, RVSP 41 mmHg.  Recent  Labs: 08/25/2021: BNP 78.8 09/26/2021: BUN 13; Creatinine, Ser 0.81; Magnesium 1.4; Potassium 3.3; Sodium 140  Recent Lipid Panel    Component Value Date/Time   CHOL 169 11/03/2014 0712   TRIG 193 (H) 11/03/2014 0712   HDL 40 (L) 11/03/2014 0712   CHOLHDL 4.2 11/03/2014 0712   VLDL 39 (H) 11/03/2014 0712   LDLCALC 90 11/03/2014 0712    Risk Assessment/Calculations:   CHA2DS2-VASc Score = 6  This indicates a 9.7% annual risk of stroke. The patient's score is based upon: CHF History: 1 HTN History: 1 Diabetes History: 1 Stroke History: 0 Vascular Disease History: 1 Age Score: 1 Gender Score: 1    Home Medications   Current Meds  Medication Sig   albuterol (VENTOLIN HFA) 108 (90 Base) MCG/ACT inhaler Inhale 2 puffs into the lungs every 6 (six) hours as needed for wheezing or shortness of breath.   allopurinol (ZYLOPRIM) 300 MG tablet TAKE 1 TABLET BY MOUTH ONCE DAILY.   apixaban (ELIQUIS) 5 MG TABS tablet Take 1 tablet (5 mg total) by mouth 2 (two) times daily.   diltiazem (CARDIZEM CD) 300 MG 24 hr capsule TAKE ONE CAPSULE BY MOUTH ONCE DAILY.   hydrOXYzine (ATARAX/VISTARIL) 25 MG tablet Take 25 mg by mouth every 8 (eight) hours as needed.   Magnesium Oxide 400 MG CAPS Take 1 capsule (400 mg total) by mouth as directed.   methocarbamol (ROBAXIN) 500 MG tablet Take 500 mg by mouth 2 (two) times daily as needed.   metoprolol (TOPROL-XL) 200 MG 24 hr tablet TAKE (1) TABLET BY MOUTH ONCE DAILY.   montelukast (SINGULAIR) 10 MG tablet Take 10 mg by mouth daily.   MOUNJARO 2.5 MG/0.5ML Pen Inject 2.5 mg into the skin once a week.   Oxycodone HCl 10 MG TABS Take 10 mg by mouth every 6 (six) hours as needed.   OXYGEN Inhale 2 L into the lungs daily.   potassium chloride SA (KLOR-CON M20) 20 MEQ tablet Take 2 tablets (40 mEq total) by mouth daily.   pravastatin (PRAVACHOL) 40 MG tablet Take 1 tablet (40 mg total) by mouth daily.   torsemide (DEMADEX) 20 MG tablet Take 4 tablets (80 mg  total) by mouth daily. Can take additional  as needed for weight above 365 lbs.     Review of Systems    All other systems reviewed and are otherwise negative except as noted above.  Physical Exam    VS:  BP 110/78 (BP Location: Left Arm, Patient Position: Sitting, Cuff Size: Large)   Pulse 70   Ht  (1.6 m)   Wt (!) 346 lb (156.9 kg)   SpO2 94% Comment: currently on 2 liters of Oxygen  BMI 61.29 kg/m  , BMI Body mass index is 61.29 kg/m.  Wt Readings from Last 3 Encounters:  07/20/22 (!) 346 lb (156.9 kg)  01/19/22 (!) 361 lb 9.6 oz (164 kg)  09/20/21 Marland Kitchen)  371 lb (168.3 kg)     GEN: Morbidly obese, 67 y.o. female in no acute distress, wearing chronic O2 HEENT: normal. Neck: Supple, no JVD, carotid bruits, or masses. Cardiac: S1/S2, Grade 1/6 murmur, irregular rhythm and regular rate, no rubs, no gallops. No clubbing, cyanosis. Chronic lymphedema. Radials/PT 2+ and equal bilaterally.  Respiratory:  Respirations regular and unlabored, clear to auscultation bilaterally. MS: No deformity or atrophy. Skin: Warm and dry, no rash. Neuro:  Strength and sensation are intact. Psych: Normal affect.  Assessment & Plan    Chronic diastolic CHF Echo revealed EF 95-62%, no RWMA, mild LVH. Weight is down. Euvolemic and well compensated on exam. Continue Toprol XL and torsemide. Low sodium diet, fluid restriction <2L, and daily weights encouraged. Educated to contact our office for weight gain of 2 lbs overnight or 5 lbs in one week. Heart healthy diet and regular cardiovascular exercise encouraged.   Persistent A-fib Occasional, brief palpitations, not bothersome/stable per her report. Will continue to monitor. Continue Toprol XL and diltiazem. Continue Eliquis 5 mg BID, denies any bleeding issues, on appropriate dosage.   Aortic valve stenosis, valvular insufficiency Echo 07/2021 revealed mild aortic valve stenosis (mean gradient 10 mmHg), mild to moderate TR, stable findings. Will  update Echo next month for monitoring. Denies any symptoms. No medication changes. Heart healthy diet and regular cardiovascular exercise encouraged.   HTN BP stable. Discussed to monitor BP at home at least 2 hours after medications and sitting for 5-10 minutes. No medication changes. Heart healthy diet and regular cardiovascular exercise encouraged.   Nocturnal hypoxia On chronic O2 continuously. Wants to know when she can get off home O2. Has never seen pulmonologist. Will refer to pulmonology.   6. Morbid obesity BMI 61.29. Has lost almost 20 lbs since 12/2021. Congratulated her. Weight loss via diet and exercise encouraged. Discussed the impact being overweight would have on cardiovascular risk. Continue current meds.   Disposition: Follow up in 6 month(s) with Dina Rich, MD or APP.  Signed, Sharlene Dory, NP 07/20/2022, 12:47 PM [facility]

## 2022-07-31 ENCOUNTER — Other Ambulatory Visit (HOSPITAL_COMMUNITY): Payer: Self-pay | Admitting: Family Medicine

## 2022-07-31 DIAGNOSIS — R1903 Right lower quadrant abdominal swelling, mass and lump: Secondary | ICD-10-CM

## 2022-08-14 ENCOUNTER — Ambulatory Visit (HOSPITAL_COMMUNITY)
Admission: RE | Admit: 2022-08-14 | Discharge: 2022-08-14 | Disposition: A | Discharge status: Home / Self Care | Payer: Medicare Other | Source: Ambulatory Visit | Attending: Family Medicine | Admitting: Family Medicine

## 2022-08-14 DIAGNOSIS — R1903 Right lower quadrant abdominal swelling, mass and lump: Secondary | ICD-10-CM | POA: Diagnosis present

## 2022-08-16 ENCOUNTER — Other Ambulatory Visit: Payer: Medicare Other

## 2022-09-12 ENCOUNTER — Institutional Professional Consult (permissible substitution): Payer: Medicare Other | Admitting: Primary Care

## 2022-09-21 ENCOUNTER — Other Ambulatory Visit: Payer: Self-pay | Admitting: *Deleted

## 2022-09-21 DIAGNOSIS — D179 Benign lipomatous neoplasm, unspecified: Secondary | ICD-10-CM

## 2022-09-25 ENCOUNTER — Encounter: Payer: Self-pay | Admitting: Surgery

## 2022-09-25 ENCOUNTER — Ambulatory Visit: Payer: Medicare Other | Admitting: Surgery

## 2022-09-25 VITALS — BP 142/87 | HR 65 | Temp 98.1°F | Resp 18 | Ht 63.0 in | Wt 330.0 lb

## 2022-09-25 DIAGNOSIS — R198 Other specified symptoms and signs involving the digestive system and abdomen: Secondary | ICD-10-CM

## 2022-09-25 DIAGNOSIS — R1031 Right lower quadrant pain: Secondary | ICD-10-CM | POA: Diagnosis not present

## 2022-09-25 MED ORDER — NYSTATIN 100000 UNIT/GM EX POWD: 1.0000 | Freq: Three times a day (TID) | CUTANEOUS | 0 refills | Status: AC

## 2022-09-25 NOTE — Progress Notes (Unsigned)
[facility] History and Physical  Reason for Referral:*** Referring Physician: ***  Chief Complaint   New Patient (Initial Visit)     Jasmin Martin is a 67 y.o. female.  HPI: ***.  The *** started *** and has had a duration of ***.  It is associated with ***.  The *** is improved with ***, and is made worse with ***.    Quality*** Context***  Past Medical History:  Diagnosis Date   Arthritis    Atrial fibrillation (HCC)    a. on Eliquis   Bilateral knee pain    CHF (congestive heart failure) (HCC)    Depression    DJD (degenerative joint disease)    HTN (hypertension)    Insomnia    Obesity, morbid (HCC)    Persistent atrial fibrillation (HCC)    Renal insufficiency     Past Surgical History:  Procedure Laterality Date   ABDOMINAL HYSTERECTOMY     bilateral knee surgery     CESAREAN SECTION     CHOLECYSTECTOMY  2001   COLONOSCOPY  2007   Dr. Jena Gauss: friable anal canal, few scattered left-sided diverticula, benign colonic biopsies    right ankle surgery     TONSILLECTOMY     VESICOVAGINAL FISTULA CLOSURE W/ TAH      Family History  Problem Relation Age of Onset   Cancer Father        Lung    CVA Son    Colon cancer Neg Hx     Social History   Tobacco Use   Smoking status: Never    Passive exposure: Never   Smokeless tobacco: Never   Tobacco comments:    Never smoked  Vaping Use   Vaping Use: Never used  Substance Use Topics   Alcohol use: No    Alcohol/week: 0.0 standard drinks of alcohol   Drug use: No    Medications: {medication reviewed/display:3041432} Allergies as of 09/25/2022       Reactions   Pedi-pre Tape Spray [wound Dressing Adhesive] Rash   White/ leaves a sore spot        Medication List        Accurate as of September 25, 2022  1:50 PM. If you have any questions, ask your nurse or doctor.          STOP taking these medications    gabapentin 100 MG capsule Commonly known as: NEURONTIN Stopped by:  Roda Lauture A Kahdijah Errickson, DO   hydrOXYzine 25 MG tablet Commonly known as: ATARAX Stopped by: Mariselda Badalamenti A Migel Hannis, DO   metFORMIN 500 MG tablet Commonly known as: GLUCOPHAGE Stopped by: Kyreese Chio A Jenifer Struve, DO       TAKE these medications    albuterol 108 (90 Base) MCG/ACT inhaler Commonly known as: VENTOLIN HFA Inhale 2 puffs into the lungs every 6 (six) hours as needed for wheezing or shortness of breath.   allopurinol 300 MG tablet Commonly known as: ZYLOPRIM TAKE 1 TABLET BY MOUTH ONCE DAILY.   apixaban 5 MG Tabs tablet Commonly known as: ELIQUIS Take 1 tablet (5 mg total) by mouth 2 (two) times daily.   diltiazem 300 MG 24 hr capsule Commonly known as: CARDIZEM CD TAKE ONE CAPSULE BY MOUTH ONCE DAILY.   Magnesium Oxide 400 MG Caps Take 1 capsule (400 mg total) by mouth as directed.   methocarbamol 500 MG tablet Commonly known as: ROBAXIN Take 500 mg by mouth 2 (two) times daily as needed.   metoprolol 200 MG 24  hr tablet Commonly known as: TOPROL-XL TAKE (1) TABLET BY MOUTH ONCE DAILY.   montelukast 10 MG tablet Commonly known as: SINGULAIR Take 10 mg by mouth daily.   Mounjaro 2.5 MG/0.5ML Pen Generic drug: tirzepatide Inject 2.5 mg into the skin once a week. Takes on Thursday   Oxycodone HCl 10 MG Tabs Take 10 mg by mouth every 6 (six) hours as needed.   OXYGEN Inhale 2 L into the lungs daily.   potassium chloride SA 20 MEQ tablet Commonly known as: [place]-Con M20 Take 2 tablets (40 mEq total) by mouth daily.   pravastatin 40 MG tablet Commonly known as: PRAVACHOL Take 1 tablet (40 mg total) by mouth daily.   torsemide 20 MG tablet Commonly known as: DEMADEX Take 4 tablets (80 mg total) by mouth daily. Can take additional 80mg  as needed for weight above 365 lbs.         ROS:  {Review of Systems:30496}  Blood pressure (!) 142/87, pulse 65, temperature 98.1 F (36.7 C), temperature source Oral, resp. rate 18, height 5\' 3"  (1.6 m),  weight (!) 330 lb (149.7 kg), SpO2 90 %. Physical Exam  Results: No results found for this or any previous visit (from the past 48 hour(s)).  No results found.   Assessment & Plan:  Jasmin Martin is a 67 y.o. female with *** -*** -*** -Follow up ***  All questions were answered to the satisfaction of the patient and family***.  The risk and benefits of *** were discussed including but not limited to ***.  After careful consideration, Jasmin Martin has decided to ***.    Pasqualino Witherspoon A Nayelly Laughman 09/25/2022, 1:50 PM   Theophilus Kinds, DO [facility] [address] [place], [place] [address] [phone] (office)

## 2022-09-27 ENCOUNTER — Other Ambulatory Visit: Payer: Self-pay | Admitting: Cardiology

## 2022-10-03 ENCOUNTER — Ambulatory Visit (HOSPITAL_COMMUNITY)
Admission: RE | Admit: 2022-10-03 | Discharge: 2022-10-03 | Disposition: A | Discharge status: Home / Self Care | Payer: Medicare Other | Source: Ambulatory Visit | Attending: Nurse Practitioner | Admitting: Nurse Practitioner

## 2022-10-03 DIAGNOSIS — I350 Nonrheumatic aortic (valve) stenosis: Secondary | ICD-10-CM | POA: Diagnosis present

## 2022-10-03 LAB — ECHOCARDIOGRAM COMPLETE
AR max vel: 1.07 cm2
AV Area VTI: 1.07 cm2
AV Area mean vel: 1.13 cm2
AV Mean grad: 11.8 mmHg
AV Peak grad: 23.8 mmHg
Ao pk vel: 2.44 m/s
Area-P 1/2: 2.54 cm2
Calc EF: 54.5 %
MV M vel: 4.46 m/s
MV Peak grad: 79.6 mmHg
S' Lateral: 3.9 cm
Single Plane A2C EF: 52.1 %
Single Plane A4C EF: 57.2 %

## 2022-10-03 NOTE — Progress Notes (Signed)
  Echocardiogram 2D Echocardiogram has been performed.  Jasmin Martin 10/03/2022, 12:08 PM

## 2022-10-11 ENCOUNTER — Telehealth: Payer: Self-pay | Admitting: Cardiology

## 2022-10-11 ENCOUNTER — Encounter: Payer: Self-pay | Admitting: *Deleted

## 2022-10-11 NOTE — Telephone Encounter (Signed)
Replied via mychart.

## 2022-10-11 NOTE — Telephone Encounter (Signed)
Pt c/o medication issue:  1. Name of Medication:   apixaban (ELIQUIS) 5 MG TABS tablet   2. How are you currently taking this medication (dosage and times per day)?   As prescribed  3. Are you having a reaction (difficulty breathing--STAT)?   No  4. What is your medication issue?   Patient stated she is in the donut hole again and will need assistance getting this medication.

## 2022-10-24 ENCOUNTER — Telehealth: Payer: Self-pay | Admitting: Cardiology

## 2022-10-24 NOTE — Telephone Encounter (Signed)
Patient called stating that we were supposed to receive a fax in regards to the patient assistance paperwork. Patient is requesting we call her with an update. Please advise.

## 2022-10-25 ENCOUNTER — Other Ambulatory Visit: Payer: Self-pay | Admitting: Cardiology

## 2022-10-25 NOTE — Telephone Encounter (Signed)
Discussed patient assistance application - missing financial info - she will try to bring to office tomorrow.

## 2022-10-25 NOTE — Telephone Encounter (Signed)
  The patient is calling back to follow up. She mentioned that she has a doctor's appointment and requested that we call her back before 2:30 pm or after 3:30 pm.

## 2022-10-31 ENCOUNTER — Encounter (HOSPITAL_COMMUNITY): Payer: Self-pay | Admitting: Radiology

## 2022-10-31 ENCOUNTER — Ambulatory Visit (HOSPITAL_COMMUNITY)
Admission: RE | Admit: 2022-10-31 | Discharge: 2022-10-31 | Disposition: A | Discharge status: Home / Self Care | Payer: Medicare Other | Source: Ambulatory Visit | Attending: Surgery | Admitting: Surgery

## 2022-10-31 DIAGNOSIS — R1031 Right lower quadrant pain: Secondary | ICD-10-CM | POA: Insufficient documentation

## 2022-10-31 DIAGNOSIS — R198 Other specified symptoms and signs involving the digestive system and abdomen: Secondary | ICD-10-CM | POA: Diagnosis present

## 2022-10-31 LAB — POCT I-STAT CREATININE: Creatinine, Ser: 0.9 mg/dL (ref 0.44–1.00)

## 2022-10-31 MED ORDER — IOHEXOL 300 MG/ML  SOLN
100.0000 mL | Freq: Once | INTRAMUSCULAR | Status: AC | PRN
  Administered 2022-10-31: 100 mL via INTRAVENOUS

## 2022-11-05 ENCOUNTER — Encounter: Payer: Self-pay | Admitting: *Deleted

## 2022-11-05 ENCOUNTER — Other Ambulatory Visit: Payer: Self-pay | Admitting: *Deleted

## 2022-11-05 MED ORDER — APIXABAN 5 MG PO TABS
5.0000 mg | ORAL_TABLET | Freq: Two times a day (BID) | ORAL | 3 refills | Status: AC
Start: 2022-11-05 — End: ?

## 2022-11-05 NOTE — Telephone Encounter (Signed)
Replied via mychart.

## 2022-11-05 NOTE — Telephone Encounter (Signed)
Patient stated she brought proof of income in to office last week and Jasmin Martin sent her a letter stating information is missing from her application.  Patient wants a call back.

## 2022-11-07 ENCOUNTER — Telehealth (INDEPENDENT_AMBULATORY_CARE_PROVIDER_SITE_OTHER): Payer: Medicare Other | Admitting: Surgery

## 2022-11-07 DIAGNOSIS — R198 Other specified symptoms and signs involving the digestive system and abdomen: Secondary | ICD-10-CM

## 2022-11-07 NOTE — Telephone Encounter (Addendum)
[facility]  Called to update the patient regarding the results of her CT of the abdomen and pelvis.  I explained that she does not have any evidence of hernia, abscess, or abdominal wall mass.  I did explain to the patient that there is some soft tissue stranding, which could either be related to edema versus cellulitis.  Advised that if she continues to have significant pain in this area, she should follow-up with her primary care doctor regarding potentially being started on antibiotics to treat cellulitis.  We discussed that there is no need for any surgery at this time, and the patient expressed that she is grateful, she did not want to have any surgery.  All questions were answered to her expressed satisfaction.  CT abdomen and pelvis (10/31/2022): IMPRESSION: Asymmetric region of moderate soft tissue stranding in right lower quadrant abdominal wall subcutaneous tissues, which may be due to edema or cellulitis. No evidence of ventral abdominal wall mass, abscess, or hernia.   Colonic diverticulosis, without radiographic evidence of diverticulitis.   Indeterminate 3 cm right adrenal mass. In patient without cancer history, this is probably a benign adenoma, and followup CT should be considered in 12 months to confirm stability. If patient does have a history of cancer, recommend further evaluation with adrenal protocol abdomen CT without and with contrast. (This recommendation follows ACR consensus guidelines: Management of Incidental Adrenal Masses: A White Paper of the ACR Incidental Findings Committee. J Am Coll Radiol 2017;14:1038-1044.)  Theophilus Kinds, DO [facility] [address] [place], [place] [address] [phone] (office)

## 2022-11-09 ENCOUNTER — Other Ambulatory Visit: Payer: Self-pay | Admitting: Cardiology

## 2022-11-13 ENCOUNTER — Telehealth: Payer: Self-pay | Admitting: Cardiology

## 2022-11-13 NOTE — Telephone Encounter (Signed)
New Message:     Patient called, she says she need to talk to Marylene Land about her application for pt assistance application for Eliquis. She said they need an original prescription, not an electronic one. Also said the date need to be fixed, patient went across the line.

## 2022-11-13 NOTE — Telephone Encounter (Signed)
Called assistance company and notified patient via mychart.

## 2022-11-14 ENCOUNTER — Institutional Professional Consult (permissible substitution): Payer: Medicare Other | Admitting: Pulmonary Disease

## 2022-11-15 ENCOUNTER — Telehealth: Payer: Self-pay | Admitting: *Deleted

## 2022-11-15 ENCOUNTER — Ambulatory Visit (INDEPENDENT_AMBULATORY_CARE_PROVIDER_SITE_OTHER): Payer: Medicare Other | Admitting: Pulmonary Disease

## 2022-11-15 ENCOUNTER — Encounter: Payer: Self-pay | Admitting: Pulmonary Disease

## 2022-11-15 VITALS — BP 137/72 | HR 74 | Ht 63.0 in | Wt 328.0 lb

## 2022-11-15 DIAGNOSIS — G4733 Obstructive sleep apnea (adult) (pediatric): Secondary | ICD-10-CM | POA: Diagnosis not present

## 2022-11-15 DIAGNOSIS — J9611 Chronic respiratory failure with hypoxia: Secondary | ICD-10-CM

## 2022-11-15 DIAGNOSIS — I2729 Other secondary pulmonary hypertension: Secondary | ICD-10-CM

## 2022-11-15 NOTE — Progress Notes (Signed)
   Subjective:    Patient ID: Jasmin Martin, female    DOB: [birth year], 67 y.o.   MRN: [id number]  HPI  67 yo  morbidly obese never smoker for FU of sleep disordered breathing and chronic respiratory failure. She is maintained on 2 L oxygen at night for several years, started by Dr. Juanetta Gosling. sleep study from 2011 showing mild OSA with CPAP was never recommended.  Initial OV 12/2020 >> She  lost weight from a peak of 410 to 365 lbs repeat home study was suggested but not done  She is referred back by cardiology for evaluation of pulm hypertension and chronic hypoxic respiratory failure she states that she was never called about scheduling sleep study. She reports increased nocturia.  She takes 80 mg of Demadex in the morning.  She reports increased nocturnal awakenings. Epworth Sleepiness Scale is 12 Bedtime is between 10 and 11 PM, she sleeps with her head of bed elevated, sleep latency about an hour, gets out of bed by 9 AM feeling tired with dryness of mouth. Weight has decreased from 365 to 328 pounds  She is not using oxygen continuously at home, arrives on POC 2 L with saturation 85% and improved to 97% with 3 L continuous  PMH -diabetes type 2, atrial fibrillation, hypertension , chronic diastolic heart failure, mild aortic stenosis    Significant tests/ events reviewed   N PSG 08/2009 -mild OSA with AHI 11/hour, severe PLM's  Echo 10/2022 shows severely elevated RVSP 61 mm, moderate AS  Review of Systems neg for any significant sore throat, dysphagia, itching, sneezing, nasal congestion or excess/ purulent secretions, fever, chills, sweats, unintended wt loss, pleuritic or exertional cp, hempoptysis, orthopnea pnd or change in chronic leg swelling. Also denies presyncope, palpitations, heartburn, abdominal pain, nausea, vomiting, diarrhea or change in bowel or urinary habits, dysuria,hematuria, rash, arthralgias, visual complaints, headache, numbness weakness or ataxia.       Objective:   Physical Exam  Gen. Pleasant, obese, in no distress ENT - no lesions, no post nasal drip Neck: No JVD, no thyromegaly, no carotid bruits Lungs: no use of accessory muscles, no dullness to percussion, decreased without rales or rhonchi  Cardiovascular: Rhythm regular, heart sounds  normal, no murmurs or gallops, no peripheral edema Musculoskeletal: No deformities, no cyanosis or clubbing , no tremors       Assessment & Plan:

## 2022-11-15 NOTE — Addendum Note (Signed)
Addended by: Shelby Dubin on: 11/15/2022 04:04 PM   Modules accepted: Orders

## 2022-11-15 NOTE — Telephone Encounter (Signed)
Fax received from [facility] - Eliquis approved free-of-charge from 11/14/2022 through 04/02/2023.

## 2022-11-15 NOTE — Assessment & Plan Note (Signed)
This is likely on the basis of chronic diastolic heart failure and pulmonary hypertension.  She is a never smoker so I doubt that she has significant airway obstruction.  Serum bicarbonate is high and this could be due to diuretics or obesity hypoventilation.  No ABG is available. I have asked her to continue using 2 L continuous oxygen when at home and increase her POC 3 to 4 L to maintain saturation above 88% when she is outside.  2 L POC does not seem to be sufficient.  Her DME is [employer]

## 2022-11-15 NOTE — Assessment & Plan Note (Signed)
We will schedule split-night study -less likely that she will need PAP therapy and may need bilevel if she has some degree of hypoventilation.  She will likely need oxygen blended onto her machine

## 2022-11-15 NOTE — Patient Instructions (Addendum)
x schedule split-night study at [facility]  Increase POC to maintain saturation above 90%

## 2022-11-15 NOTE — Assessment & Plan Note (Signed)
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  Will initially treat her sleep disordered breathing with PAP and oxygen.  Then we can consider right heart cath She does seem to be optimally diuresed

## 2022-12-19 ENCOUNTER — Telehealth: Payer: Self-pay | Admitting: Pulmonary Disease

## 2022-12-19 NOTE — Telephone Encounter (Signed)
Patient was called to discuss scheduling her follow up appointment in November.  She states she has not had her sleep study.  Please advise

## 2022-12-26 NOTE — Telephone Encounter (Signed)
Lmam for the sleep center

## 2022-12-26 NOTE — Telephone Encounter (Signed)
Patient is scheduled for sleep study 01/16/23

## 2023-01-03 ENCOUNTER — Telehealth: Payer: Self-pay | Admitting: Orthopaedic Surgery

## 2023-01-16 ENCOUNTER — Ambulatory Visit (HOSPITAL_BASED_OUTPATIENT_CLINIC_OR_DEPARTMENT_OTHER): Payer: Medicare Other | Attending: Pulmonary Disease | Admitting: Pulmonary Disease

## 2023-01-16 DIAGNOSIS — I493 Ventricular premature depolarization: Secondary | ICD-10-CM | POA: Insufficient documentation

## 2023-01-16 DIAGNOSIS — G4733 Obstructive sleep apnea (adult) (pediatric): Secondary | ICD-10-CM | POA: Diagnosis not present

## 2023-01-16 DIAGNOSIS — G2581 Restless legs syndrome: Secondary | ICD-10-CM | POA: Diagnosis not present

## 2023-01-16 DIAGNOSIS — J9611 Chronic respiratory failure with hypoxia: Secondary | ICD-10-CM | POA: Diagnosis present

## 2023-01-25 ENCOUNTER — Other Ambulatory Visit: Payer: Self-pay | Admitting: Cardiology

## 2023-01-29 ENCOUNTER — Telehealth: Payer: Self-pay | Admitting: Pulmonary Disease

## 2023-01-29 DIAGNOSIS — G4733 Obstructive sleep apnea (adult) (pediatric): Secondary | ICD-10-CM

## 2023-01-29 DIAGNOSIS — J9611 Chronic respiratory failure with hypoxia: Secondary | ICD-10-CM

## 2023-01-29 NOTE — Procedures (Signed)
Patient Name: Jasmin Martin, Jasmin Martin Date: 01/16/2023 Gender: Female D.O.B: Dec 17, 1955 Age (years): 66 Referring Provider: Cyril Mourning MD, ABSM Height (inches): 63 Interpreting Physician: Cyril Mourning MD, ABSM Weight (lbs): 325 RPSGT: Lowry Ram BMI: 58 MRN: [id number] Neck Size: 15.50 <br> <br> CLINICAL INFORMATION Sleep Study Type: NPSG    Indication for sleep study: Congestive Heart Failure, Diabetes, Hypertension, Obesity, Re-Evaluation    Epworth Sleepiness Score: 4    SLEEP STUDY TECHNIQUE As per the AASM Manual for the Scoring of Sleep and Associated Events v2.3 (April 2016) with a hypopnea requiring 4% desaturations.  The channels recorded and monitored were frontal, central and occipital EEG, electrooculogram (EOG), submentalis EMG (chin), nasal and oral airflow, thoracic and abdominal wall motion, anterior tibialis EMG, snore microphone, electrocardiogram, and pulse oximetry.  MEDICATIONS Medications self-administered by patient taken the night of the study : N/A  SLEEP ARCHITECTURE The study was initiated at 10:26:26 PM and ended at 5:05:02 AM.  Sleep onset time was 10.7 minutes and the sleep efficiency was 33.9%. The total sleep time was 135 minutes.  Stage REM latency was N/A minutes.  The patient spent 14.8% of the night in stage N1 sleep, 85.2% in stage N2 sleep, 0.0% in stage N3 and 0% in REM.  Alpha intrusion was absent.  Supine sleep was 2.22%.  RESPIRATORY PARAMETERS The overall apnea/hypopnea index (AHI) was 1.3 per hour. There were 2 total apneas, including 2 obstructive, 0 central and 0 mixed apneas. There were 1 hypopneas and 52 RERAs.  The AHI during Stage REM sleep was N/A per hour.  AHI while supine was 0.0 per hour.  The mean oxygen saturation was 93.8%. The minimum SpO2 during sleep was 90.0%.  moderate snoring was noted during this study.  CARDIAC DATA The 2 lead EKG demonstrated atrial fibrillation. The mean heart rate was 64.1  beats per minute. Other EKG findings include: PVCs.   LEG MOVEMENT DATA The total PLMS were 0 with a resulting PLMS index of 0.0. Associated arousal with leg movement index was 58.7 .  IMPRESSIONS - No significant obstructive sleep apnea occurred during this study (AHI = 1.3/h). Upper airway resistance syndomr was noted with significant RERAs - The patient had minimal or no oxygen desaturation during the study (Min O2 = 90.0%). Study was performed at her baseline 2L O2. - The patient snored with moderate snoring volume. She slept on a wedge due to dyspnea - EKG findings include PVCs. - Clinically significant limb movements did occur during sleep. Associated arousals were significant.   DIAGNOSIS - Nocturnal Hypoxemia (G47.36) - Corelate with history of restless legs syndrome   RECOMMENDATIONS - Continue 2 L O2 during sleep - Consider ABG to evaluate for obesity hypoventilation - Consider evaluation & treatment of Leg Movements of Sleep. - Avoid alcohol, sedatives and other CNS depressants that may worsen sleep apnea and disrupt normal sleep architecture. - Sleep hygiene should be reviewed to assess factors that may improve sleep quality. - Weight management and regular exercise should be initiated or continued if appropriate.  [Electronically signed] 01/29/2023 06:02 AM  Cyril Mourning MD, ABSM Diplomate, American Board of Sleep Medicine NPI: [id number]

## 2023-01-29 NOTE — Telephone Encounter (Signed)
No significant OSA She only slept for about 2 hours Continue 2 L during sleep I would like her to obtain ABG -either at [facility] or [facility] location-to see if her resting or when oxygen level is high

## 2023-01-31 ENCOUNTER — Other Ambulatory Visit: Payer: Self-pay

## 2023-01-31 DIAGNOSIS — J9611 Chronic respiratory failure with hypoxia: Secondary | ICD-10-CM

## 2023-01-31 DIAGNOSIS — I2729 Other secondary pulmonary hypertension: Secondary | ICD-10-CM

## 2023-01-31 NOTE — Telephone Encounter (Signed)
Called and spoke with patient regarding her result, pt stated that she understood results, informed pt that she will need to have lab work done , pt said that she will come to [place] office to have this done. Order was placed . Gave pt time she could and have this done.

## 2023-02-04 NOTE — Telephone Encounter (Signed)
ABGs are not typically drawn in clinic, these are done at the hospital. Orders have been changed to [facility] lab, as patient lives in [place].   MyChart message sent to patient to advise she will receive a call to schedule.

## 2023-02-16 IMAGING — DX DG CHEST 1V PORT
1 series · 1 of 1 positions shown · non-contrast
Comparison: 09/05/2016

CLINICAL DATA: Shortness of breath and cough

EXAM:
PORTABLE CHEST 1 VIEW

[chest ap grid]
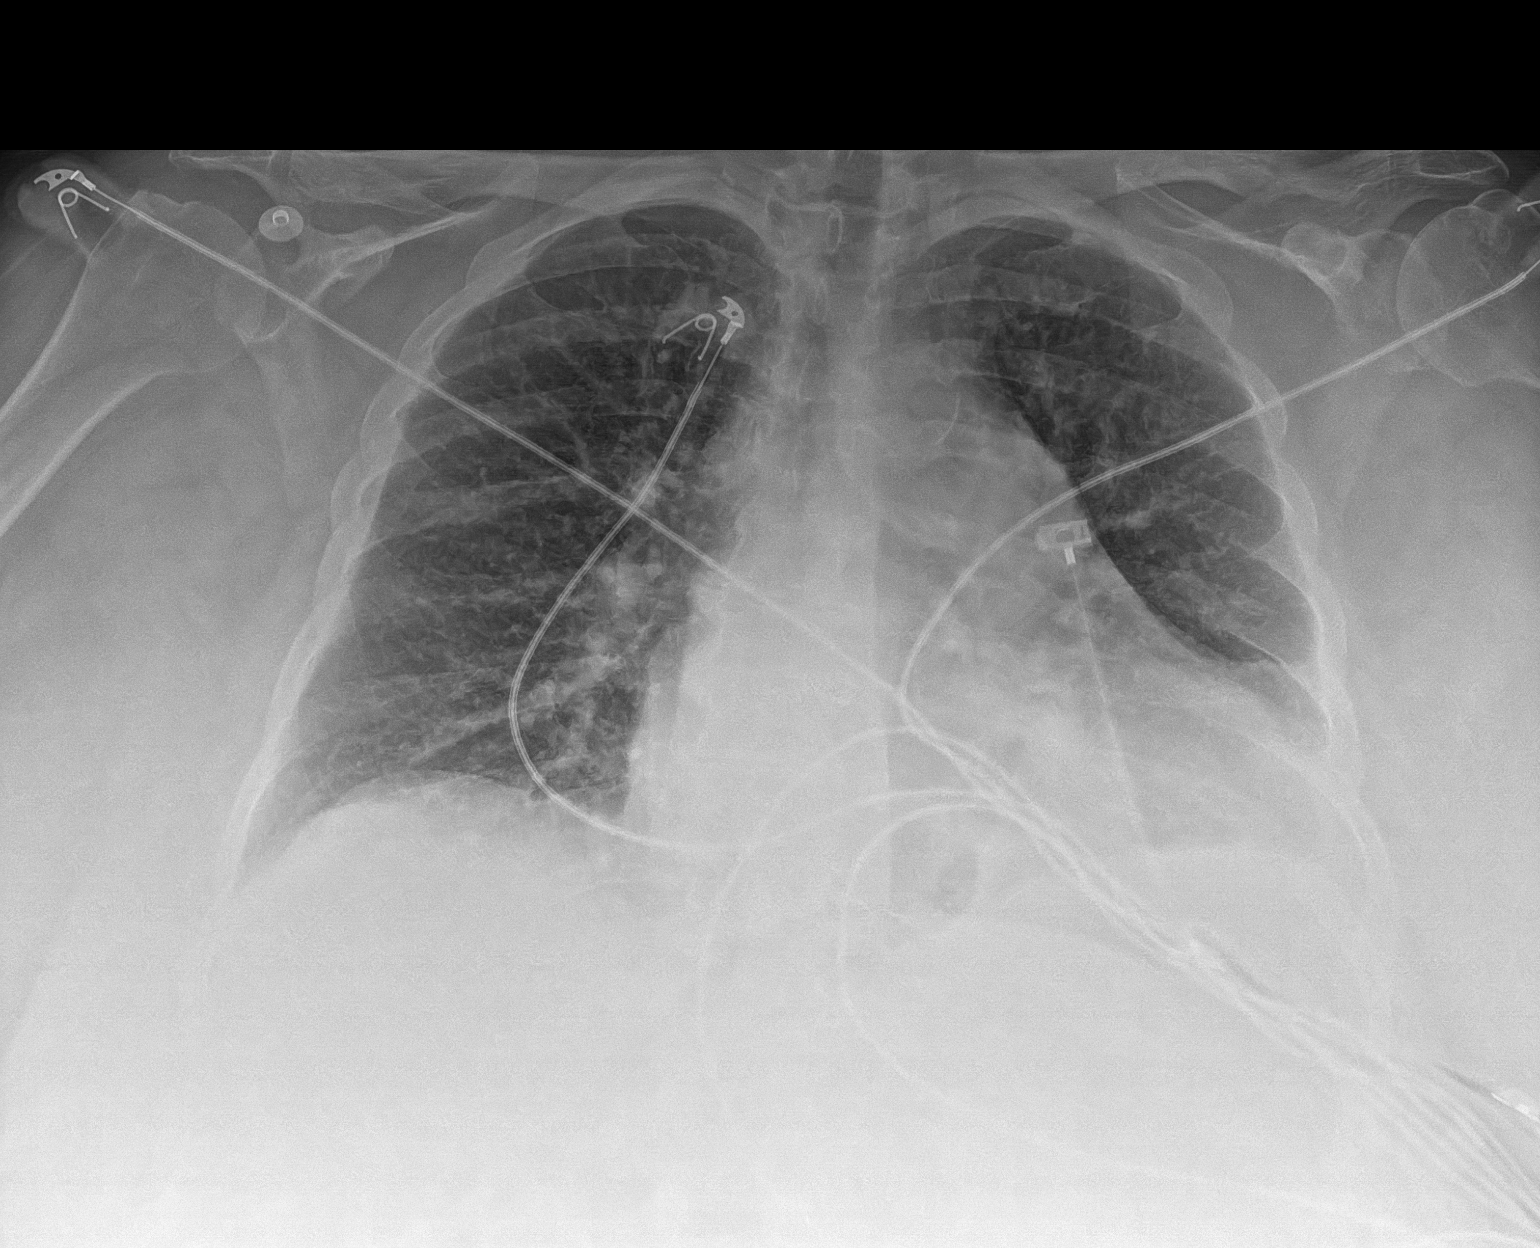

[1 of 1 positions shown; findings below may reference images not displayed]

FINDINGS: Cardiac shadow is enlarged. Aortic calcifications are again noted.
The lungs are hypoinflated with left basilar atelectasis. No bony
abnormality is seen.
IMPRESSION: Mild left basilar atelectasis.

## 2023-02-26 ENCOUNTER — Ambulatory Visit (HOSPITAL_COMMUNITY)
Admission: RE | Admit: 2023-02-26 | Discharge: 2023-02-26 | Disposition: A | Discharge status: Home / Self Care | Payer: Medicare Other | Source: Ambulatory Visit | Attending: Pulmonary Disease | Admitting: Pulmonary Disease

## 2023-02-26 DIAGNOSIS — G4733 Obstructive sleep apnea (adult) (pediatric): Secondary | ICD-10-CM | POA: Diagnosis present

## 2023-02-26 DIAGNOSIS — J9611 Chronic respiratory failure with hypoxia: Secondary | ICD-10-CM | POA: Insufficient documentation

## 2023-02-26 LAB — BLOOD GAS, ARTERIAL
Acid-Base Excess: 5.8 mmol/L — ABNORMAL HIGH (ref 0.0–2.0)
Bicarbonate: 30.6 mmol/L — ABNORMAL HIGH (ref 20.0–28.0)
Drawn by: 560031
FIO2: 28 %
O2 Saturation: 93.8 %
Patient temperature: 37.1
pCO2 arterial: 44 mm[Hg] (ref 32–48)
pH, Arterial: 7.45 (ref 7.35–7.45)
pO2, Arterial: 63 mm[Hg] — ABNORMAL LOW (ref 83–108)

## 2023-03-28 ENCOUNTER — Ambulatory Visit: Payer: Medicare Other | Admitting: Primary Care

## 2023-05-21 ENCOUNTER — Ambulatory Visit: Payer: Medicare PPO | Admitting: Adult Health

## 2023-05-21 ENCOUNTER — Encounter: Payer: Self-pay | Admitting: Adult Health

## 2023-05-23 IMAGING — DX DG HIP (WITH OR WITHOUT PELVIS) 2-3V*L*
3 series · 3 of 3 positions shown · non-contrast
Comparison: 05/02/2011

CLINICAL DATA: Left hip pain unable to move

EXAM:
DG HIP (WITH OR WITHOUT PELVIS) 2-3V LEFT

[pelvis ap]
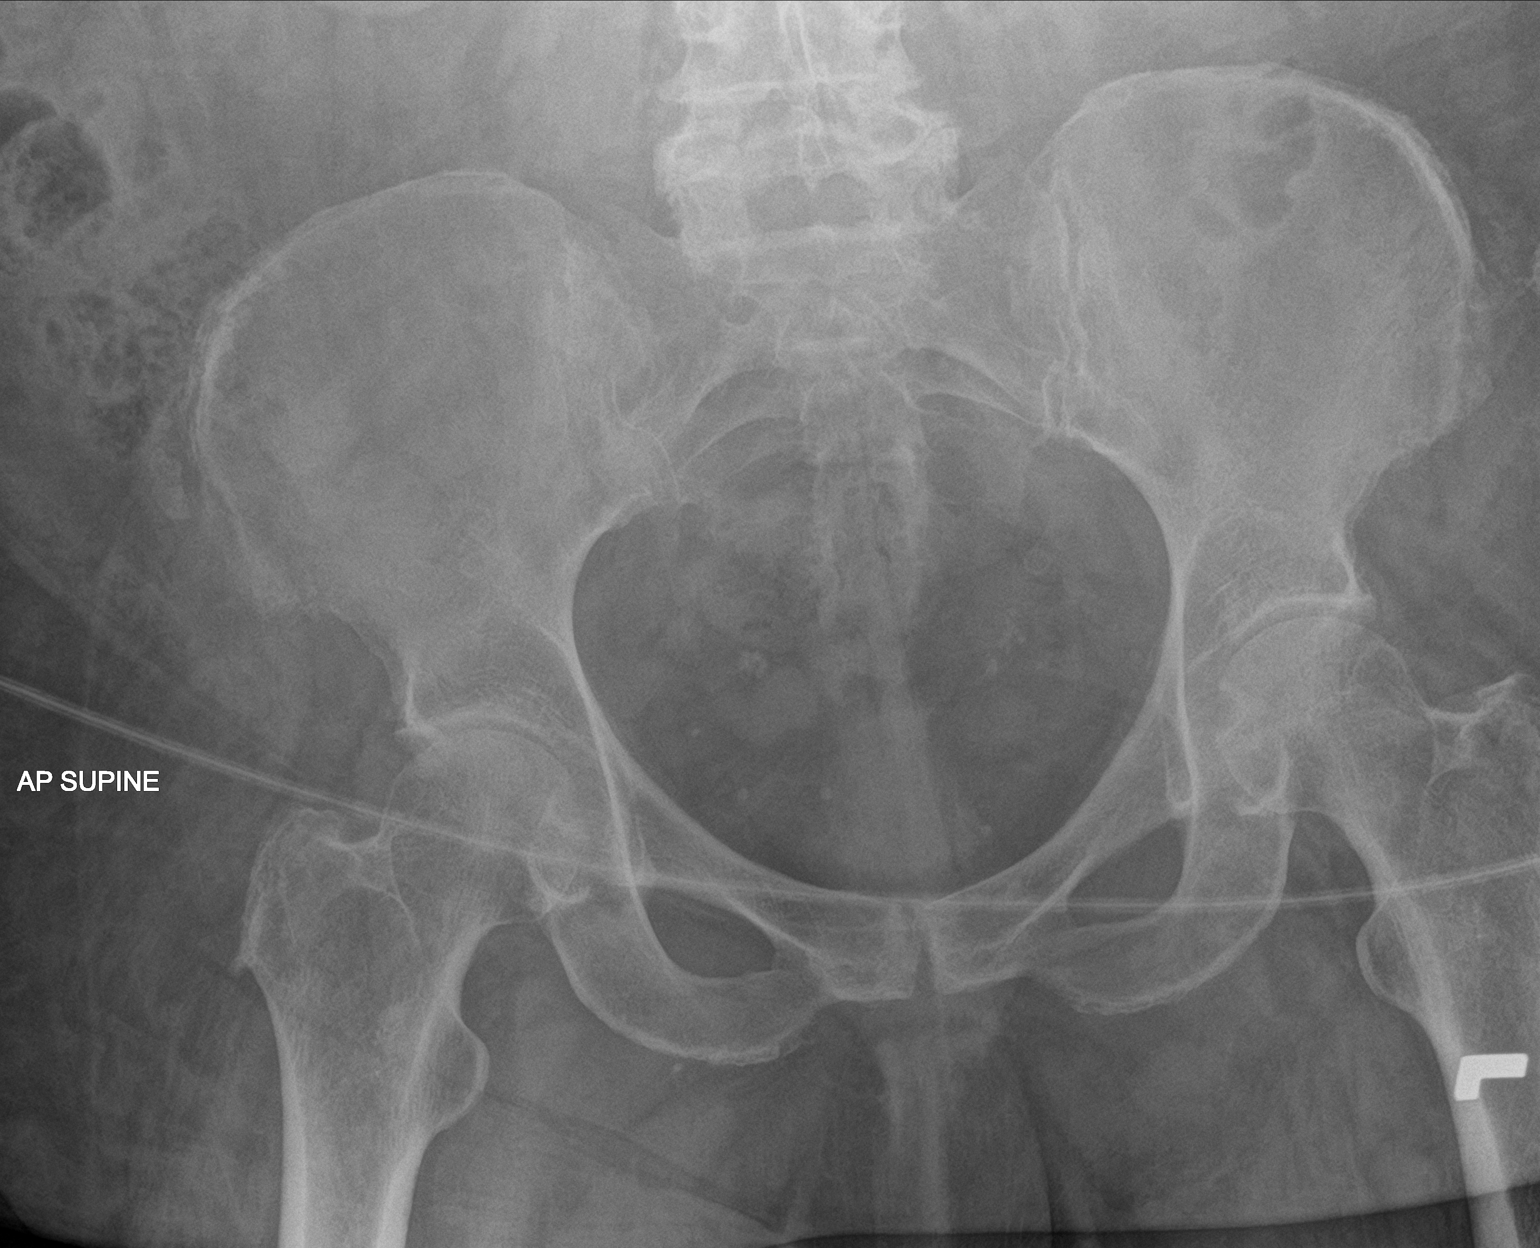

[hip ap]
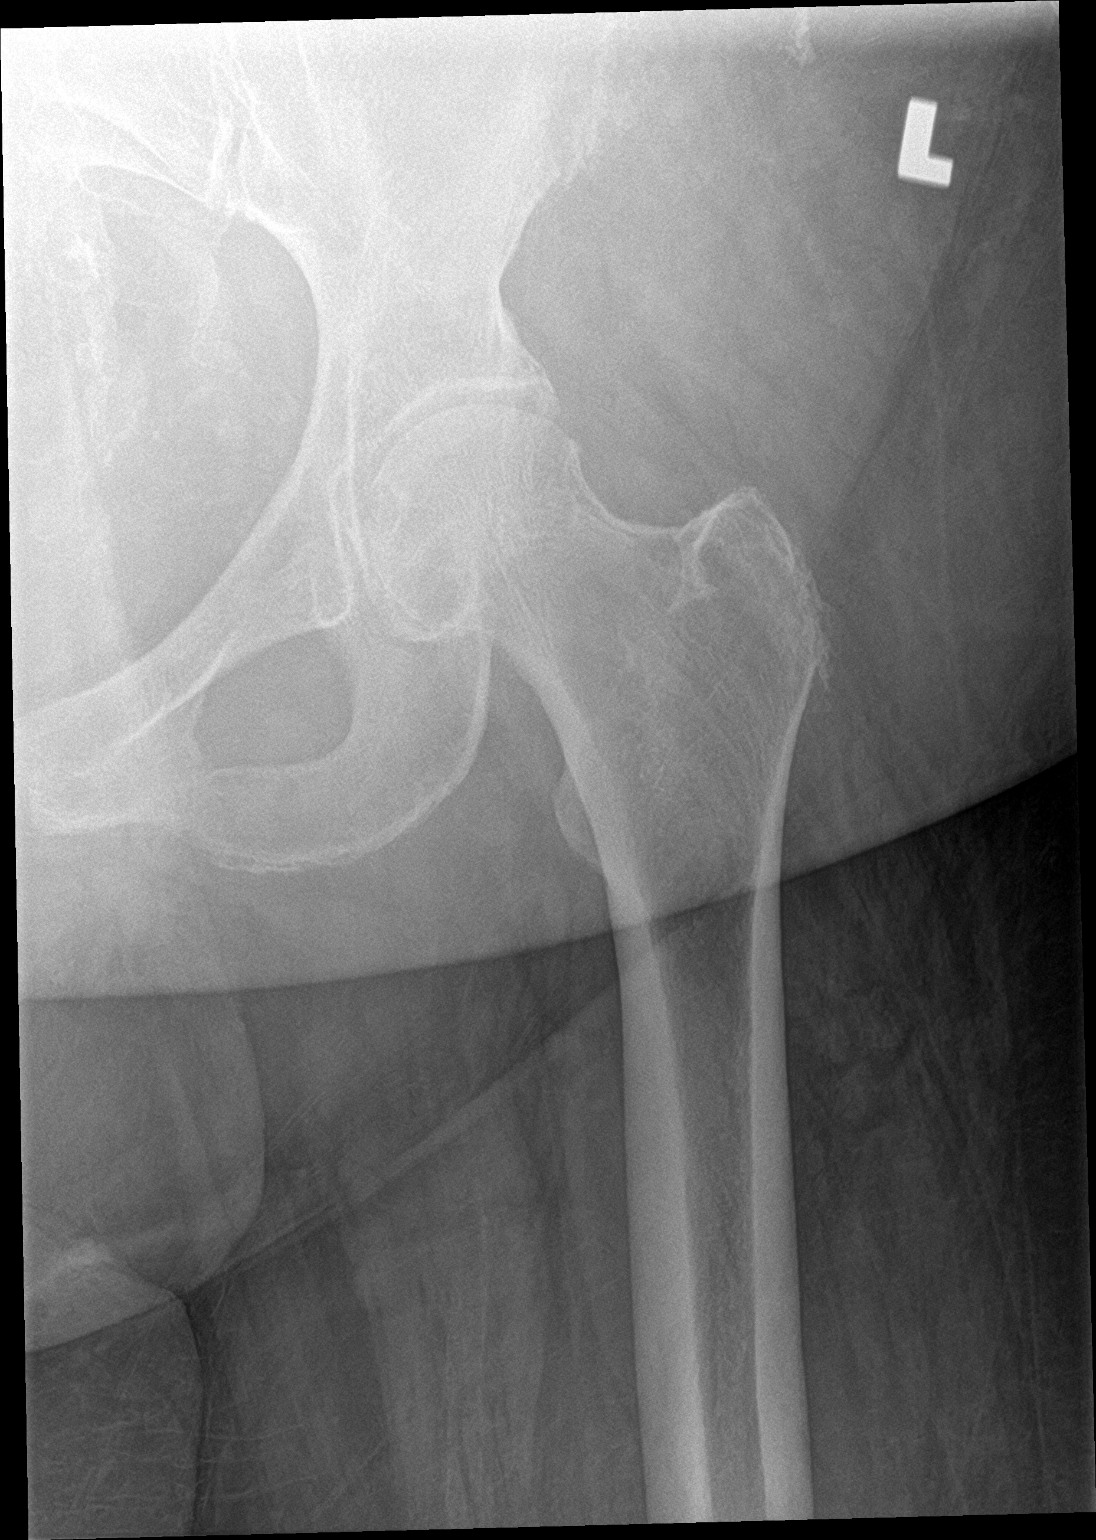

[hip frog leg]
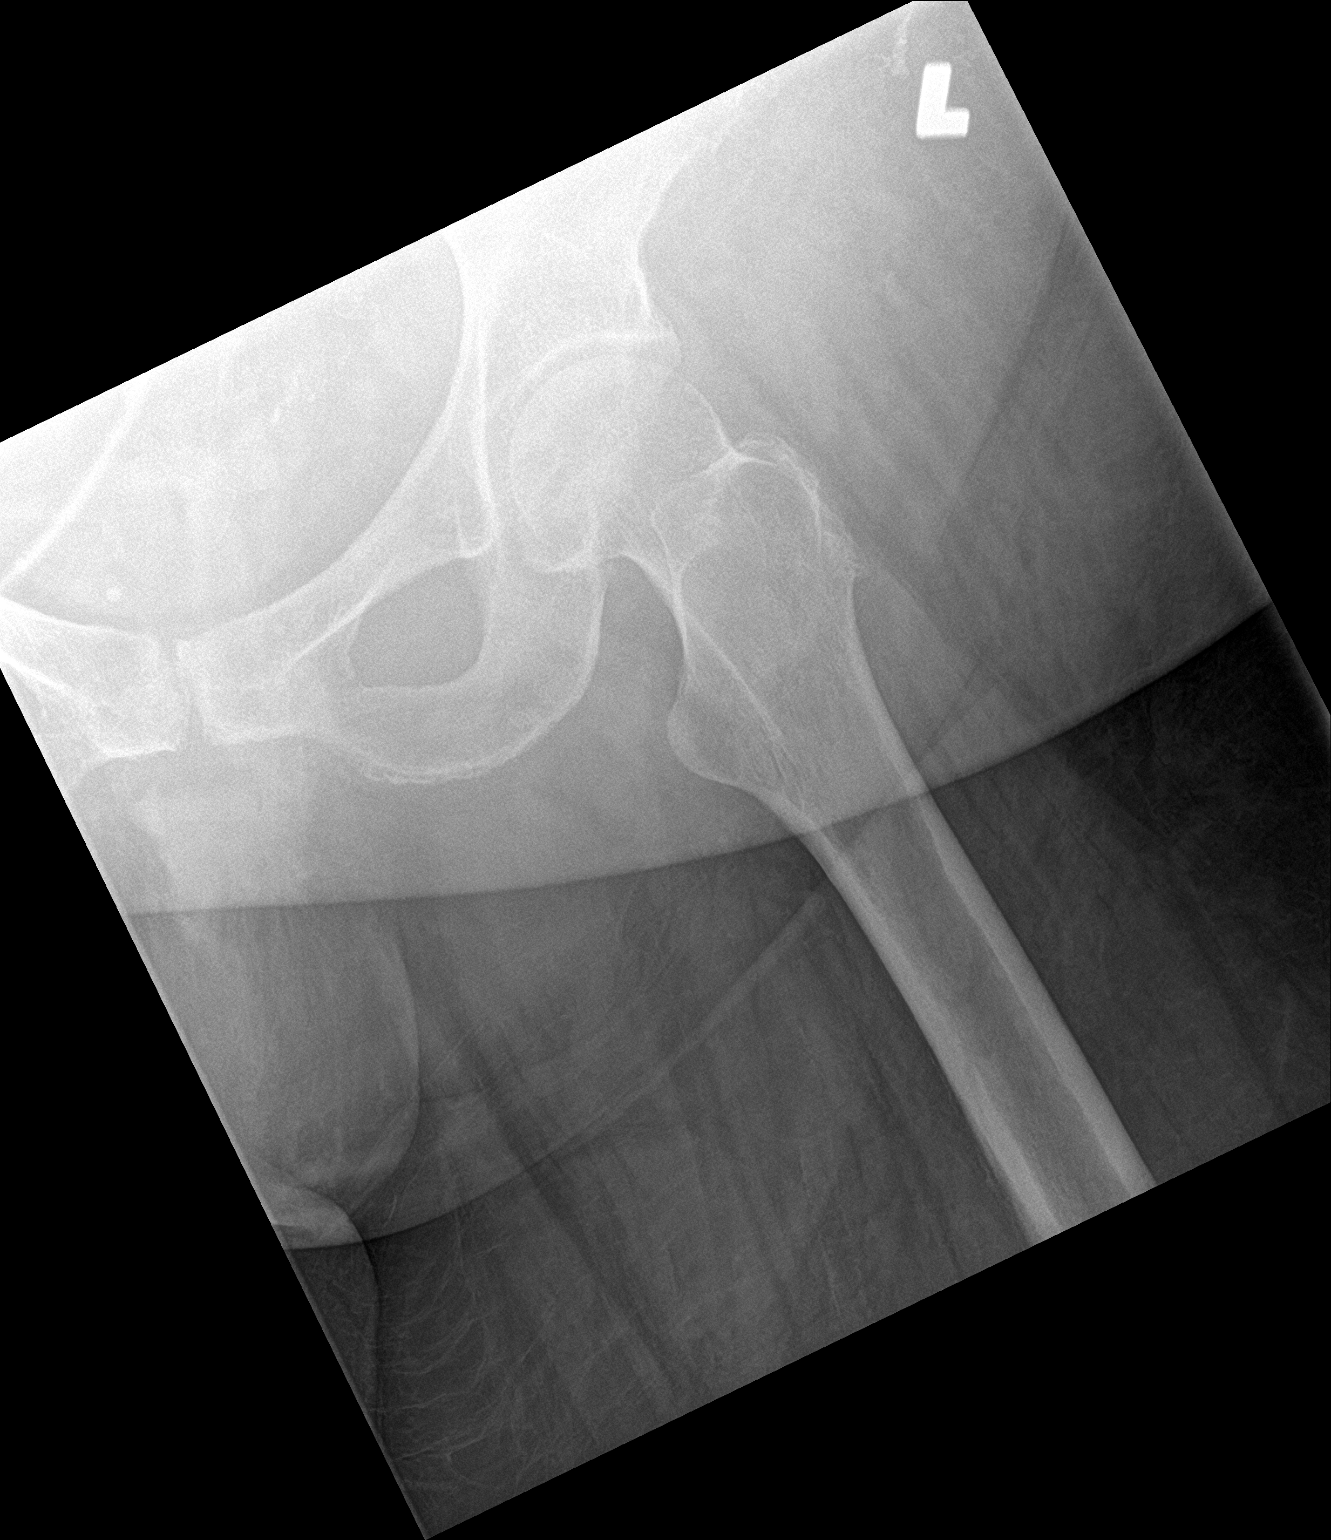

[3 of 3 positions shown; findings below may reference images not displayed]

FINDINGS: SI joints are non widened. Pubic symphysis and rami are intact. No
fracture or malalignment
IMPRESSION: No acute osseous abnormality

## 2023-05-23 IMAGING — DX DG CHEST 1V
1 series · 1 of 1 positions shown · non-contrast
Comparison: 10/11/2020

CLINICAL DATA: Preop hip pain after fall

EXAM:
CHEST  1 VIEW

[chest ap]
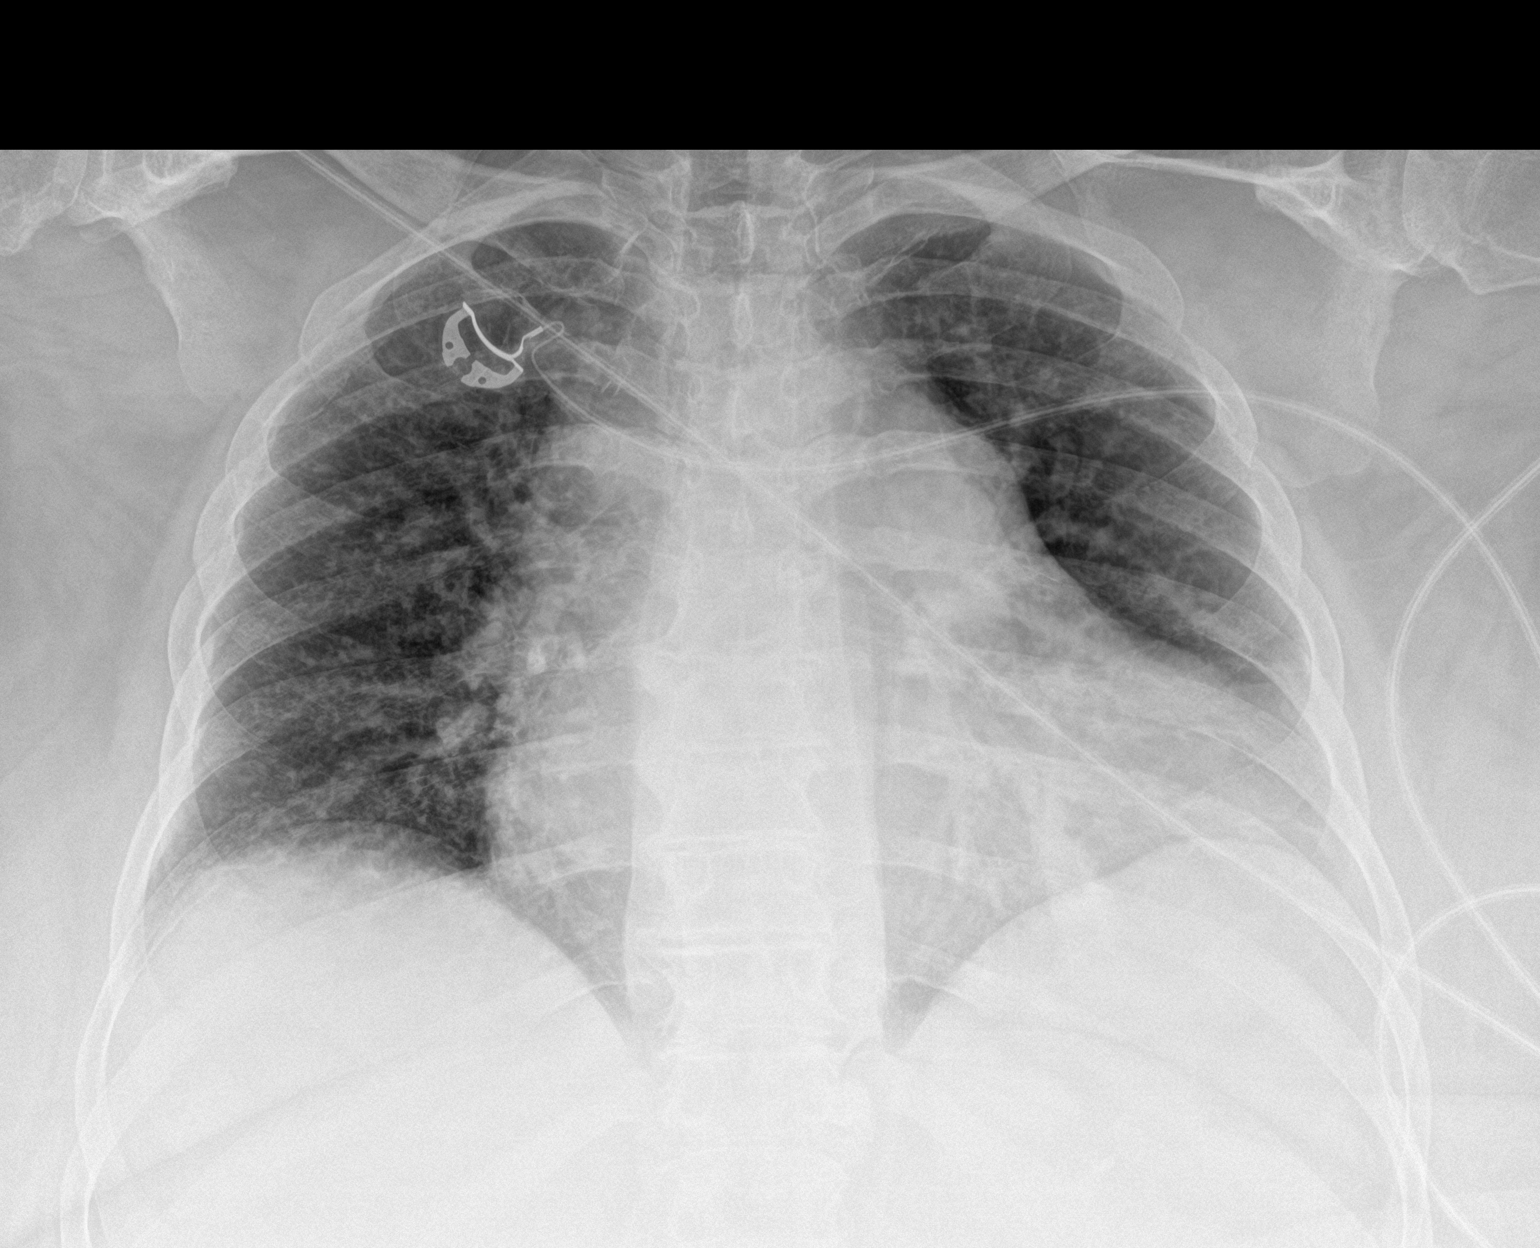

[1 of 1 positions shown; findings below may reference images not displayed]

FINDINGS: Cardiomegaly with vascular congestion. Streaky atelectasis left
base. No consolidation, pleural effusion, or pneumothorax.
IMPRESSION: Cardiomegaly with vascular congestion. Subsegmental atelectasis left
base

## 2023-06-19 ENCOUNTER — Other Ambulatory Visit: Payer: Self-pay | Admitting: Cardiology

## 2023-07-22 ENCOUNTER — Ambulatory Visit: Payer: Medicare PPO | Admitting: Nurse Practitioner

## 2023-07-22 NOTE — Progress Notes (Deleted)
 Office Visit    Patient Name: Jasmin Martin Date of Encounter: 07/22/2023  PCP:  Nsumanganyi, Kalombo Cesar, NP   [facility]  Cardiologist:  Armida Lander, MD  Advanced Practice Provider:  No care team member to display Electrophysiologist:  None   Chief Complaint    Jasmin Martin is a 67 y.o. fe68 y.o. female with a hx of chronic diastolic CHF, A-fib, hypertension, aortic valve stenosis, renal insufficiency, nocturnal hypoxia, and obesity, who presents today for 87-month follow-up.  Past Medical History    Past Medical History:  Diagnosis Date   Arthritis    Atrial fibrillation (HCC)    a. on Eliquis    Bilateral knee pain    CHF (congestive heart failure) (HCC)    Depression    DJD (degenerative joint disease)    HTN (hypertension)    Insomnia    Obesity, morbid (HCC)    Persistent atrial fibrillation (HCC)    Renal insufficiency    Past Surgical History:  Procedure Laterality Date   ABDOMINAL HYSTERECTOMY     bilateral knee surgery     CESAREAN SECTION     CHOLECYSTECTOMY  2001   COLONOSCOPY  2007   Dr. Riley Cheadle: friable anal canal, few scattered left-sided diverticula, benign colonic biopsies    right ankle surgery     TONSILLECTOMY     VESICOVAGINAL FISTULA CLOSURE W/ TAH      Allergies  Allergies  Allergen Reactions   Pedi-Pre Tape Spray [Wound Dressing Adhesive] Rash    White/ leaves a sore spot    History of Present Illness    Jasmin Martin is a 68 y.o. female with a PMH as mentioned above.  Echocardiogram in May 2023 revealed EF 65 to 70%, indeterminate diastolic function, normal RV.  Mild aortic valve stenosis noted with mean gradient along aortic valve 10 mmHg, overall stable findings from previous study.  Last seen by Dr. Armida Lander in January 19, 2022.  Weight was stable.  Was overall doing well at the time.  Had upcoming sleep apnea test in November.  Today she presents for 87-month follow-up.  She states she is doing  well. Recently started Mounjaro and is doing well on it. Changing her diet. Weight is down almost 20 lbs since October 2023. Denies any chest pain, shortness of breath, syncope, presyncope, dizziness, orthopnea, PND, swelling or significant weight changes, acute bleeding, or claudication. Wears 2 liters of oxygen , used to wear only at PM for 5-6 years, now wearing oxygen  continuously. Has never seen pulmonology. Notes occasional infrequent palpitations, brief, not bothersome per her report.   SH: She has loved doing [employer] in her free time.   EKGs/Labs/Other Studies Reviewed:   The following studies were reviewed today:   EKG:  EKG is ordered today.  The ekg ordered today demonstrates Afib, 66 bpm, nonspecific ST/T wave abnormality, no acute ischemic changes.   Echo 07/2021:   1. Left ventricular ejection fraction, by estimation, is 65 to 70%. Left  ventricular ejection fraction by PLAX is 66 %. The left ventricle has  normal function. The left ventricle has no regional wall motion  abnormalities. There is mild left ventricular  hypertrophy. Left ventricular diastolic function could not be evaluated.   2. Right ventricular systolic function is normal. The right ventricular  size is normal. Tricuspid regurgitation signal is inadequate for assessing  PA pressure.   3. Left atrial size was moderately dilated.   4. Right atrial size was severely  dilated.   5. The pericardial effusion is circumferential.   6. The mitral valve is abnormal. Trivial mitral valve regurgitation.   7. Tricuspid valve regurgitation is mild to moderate.   8. The aortic valve is calcified. Aortic valve regurgitation is not  visualized. Mild aortic valve stenosis. Aortic valve area, by VTI measures  1.22 cm. Aortic valve mean gradient measures 10.0 mmHg. Aortic valve Vmax  measures 2.06 m/s. DI is 0.43.   Comparison(s): Changes from prior study are noted. 10/12/2020: LVEF 55-60%,  mild AS - mean gradient 13 mmHg,  RVSP 41 mmHg.  Recent Labs: 10/31/2022: Creatinine, Ser 0.90  Recent Lipid Panel    Component Value Date/Time   CHOL 169 11/03/2014 0712   TRIG 193 (H) 11/03/2014 0712   HDL 40 (L) 11/03/2014 0712   CHOLHDL 4.2 11/03/2014 0712   VLDL 39 (H) 11/03/2014 0712   LDLCALC 90 11/03/2014 0712    Risk Assessment/Calculations:   CHA2DS2-VASc Score =    This indicates a  % annual risk of stroke. The patient's score is based upon:      Home Medications   No outpatient medications have been marked as taking for the 07/22/23 encounter (Appointment) with Lasalle Pointer, NP.     Review of Systems    All other systems reviewed and are otherwise negative except as noted above.  Physical Exam    VS:  There were no vitals taken for this visit. , BMI There is no height or weight on file to calculate BMI.  Wt Readings from Last 3 Encounters:  01/16/23 (!) 325 lb (147.4 kg)  11/15/22 (!) 328 lb (148.8 kg)  09/25/22 (!) 330 lb (149.7 kg)     GEN: Morbidly obese, 68 y.o. female in no acute distress, wearing chronic O2 HEENT: normal. Neck: Supple, no JVD, carotid bruits, or masses. Cardiac: S1/S2, Grade 1/6 murmur, irregular rhythm and regular rate, no rubs, no gallops. No clubbing, cyanosis. Chronic lymphedema. Radials/PT 2+ and equal bilaterally.  Respiratory:  Respirations regular and unlabored, clear to auscultation bilaterally. MS: No deformity or atrophy. Skin: Warm and dry, no rash. Neuro:  Strength and sensation are intact. Psych: Normal affect.  Assessment & Plan    Chronic diastolic CHF Echo revealed EF 16-10%, no RWMA, mild LVH. Weight is down. Euvolemic and well compensated on exam. Continue Toprol  XL and torsemide . Low sodium diet, fluid restriction <2L, and daily weights encouraged. Educated to contact our office for weight gain of 2 lbs overnight or 5 lbs in one week. Heart healthy diet and regular cardiovascular exercise encouraged.   Persistent A-fib Occasional, brief  palpitations, not bothersome/stable per her report. Will continue to monitor. Continue Toprol  XL and diltiazem . Continue Eliquis  5 mg BID, denies any bleeding issues, on appropriate dosage.   Aortic valve stenosis, valvular insufficiency Echo 07/2021 revealed mild aortic valve stenosis (mean gradient 10 mmHg), mild to moderate TR, stable findings. Will update Echo next month for monitoring. Denies any symptoms. No medication changes. Heart healthy diet and regular cardiovascular exercise encouraged.   HTN BP stable. Discussed to monitor BP at home at least 2 hours after medications and sitting for 5-10 minutes. No medication changes. Heart healthy diet and regular cardiovascular exercise encouraged.   Nocturnal hypoxia On chronic O2 continuously. Wants to know when she can get off home O2. Has never seen pulmonologist. Will refer to pulmonology.   6. Morbid obesity BMI 61.29. Has lost almost 20 lbs since 12/2021. Congratulated her. Weight loss via diet and exercise  encouraged. Discussed the impact being overweight would have on cardiovascular risk. Continue current meds.   Disposition: Follow up in 6 month(s) with Armida Lander, MD or APP.  Signed, Lasalle Pointer, NP 07/22/2023, 8:28 AM [facility]

## 2023-09-07 ENCOUNTER — Other Ambulatory Visit: Payer: Self-pay | Admitting: Cardiology

## 2023-09-13 ENCOUNTER — Encounter: Payer: Self-pay | Admitting: Nurse Practitioner

## 2023-09-13 ENCOUNTER — Ambulatory Visit: Attending: Nurse Practitioner | Admitting: Nurse Practitioner

## 2023-09-13 VITALS — BP 138/84 | HR 60 | Ht 64.0 in | Wt 311.2 lb

## 2023-09-13 DIAGNOSIS — I5032 Chronic diastolic (congestive) heart failure: Secondary | ICD-10-CM

## 2023-09-13 DIAGNOSIS — I350 Nonrheumatic aortic (valve) stenosis: Secondary | ICD-10-CM | POA: Diagnosis not present

## 2023-09-13 DIAGNOSIS — G4734 Idiopathic sleep related nonobstructive alveolar hypoventilation: Secondary | ICD-10-CM

## 2023-09-13 DIAGNOSIS — I38 Endocarditis, valve unspecified: Secondary | ICD-10-CM | POA: Diagnosis not present

## 2023-09-13 DIAGNOSIS — I10 Essential (primary) hypertension: Secondary | ICD-10-CM

## 2023-09-13 DIAGNOSIS — I4819 Other persistent atrial fibrillation: Secondary | ICD-10-CM | POA: Diagnosis not present

## 2023-09-13 NOTE — Patient Instructions (Signed)
 Medication Instructions:  Continue all current medications.   Labwork: none  Testing/Procedures: none  Follow-Up: 6 months   Any Other Special Instructions Will Be Listed Below (If Applicable).   If you need a refill on your cardiac medications before your next appointment, please call your pharmacy.

## 2023-09-13 NOTE — Progress Notes (Unsigned)
 Office Visit    Patient Name: Jasmin Martin Date of Encounter: 09/13/2023 PCP:  Nsumanganyi, Kalombo Cesar, NP  [facility]  Cardiologist:  Armida Lander, MD  Advanced Practice Provider:  No care team member to display Electrophysiologist:  None   Chief Complaint and HPI    Jasmin Martin is a 68 y.o. female with a hx of chronic diastolic CHF, A-fib, hypertension, aortic valve stenosis, renal insufficiency, nocturnal hypoxia, and obesity, who presents today for overdue follow-up.  Echocardiogram in May 2023 revealed EF 65 to 70%, indeterminate diastolic function, normal RV.  Mild aortic valve stenosis noted with mean gradient along aortic valve 10 mmHg, overall stable findings from previous study.  Last seen by Dr. Armida Lander in January 19, 2022.  Weight was stable.  Was overall doing well at the time.  Had upcoming sleep apnea test in November.  09/13/2023 - Today she presents for follow-up.  She has lost a significant amount of weight since I last saw her.  She has lost over 30 pounds since the last visit per our scales.  However, she says she is lost in total close to nearly 100 pounds.  She is able to move better and says breathe better.  For activity because of her knees, she will stand up and dance occasionally, says she was unable to do that previously with her weight gain. Denies any chest pain, shortness of breath, palpitations, syncope, presyncope, dizziness, orthopnea, PND, swelling or significant weight changes, acute bleeding, or claudication.  SH: She has loved doing [employer] in her free time.   EKGs/Labs/Other Studies Reviewed:   The following studies were reviewed today:   EKG:   EKG Interpretation Date/Time:  Friday September 13 2023 13:01:11 EDT Ventricular Rate:  75 PR Interval:    QRS Duration:  100 QT Interval:  420 QTC Calculation: 469 R Axis:   205  Text Interpretation: Atrial fibrillation with premature ventricular or  aberrantly conducted complexes Low voltage QRS Possible Anterolateral infarct , age undetermined When compared with ECG of 11-Nov-2020 18:26, PREVIOUS ECG IS PRESENT Confirmed by Lasalle Pointer [phone]) on 09/13/2023 1:06:47 PM    Echo 10/2022:  1. Left ventricular ejection fraction, by estimation, is 50 to 55%. The  left ventricle has low normal function. The left ventricle has no regional  wall motion abnormalities. Left ventricular diastolic parameters are  indeterminate.   2. Right ventricular systolic function is low normal. The right  ventricular size is mildly enlarged. There is severely elevated pulmonary  artery systolic pressure.   3. Left atrial size was moderately dilated.   4. Right atrial size was moderately dilated.   5. The mitral valve is abnormal. Mild mitral valve regurgitation. No  evidence of mitral stenosis.   6. The tricuspid valve is abnormal. Tricuspid valve regurgitation is mild  to moderate.   7. Moderate paradoxical low flow low gradient aortic stenosis. Mean grad  12, AVA VTI 1.07, DI 0.42, SVI 28. . The aortic valve is tricuspid. There  is moderate calcification of the aortic valve. There is moderate  thickening of the aortic valve. Aortic  valve regurgitation is not visualized.   8. The inferior vena cava is normal in size with greater than 50%  respiratory variability, suggesting right atrial pressure of 3 mmHg.  Echo 07/2021:   1. Left ventricular ejection fraction, by estimation, is 65 to 70%. Left  ventricular ejection fraction by PLAX is 66 %. The left ventricle has  normal  function. The left ventricle has no regional wall motion  abnormalities. There is mild left ventricular  hypertrophy. Left ventricular diastolic function could not be evaluated.   2. Right ventricular systolic function is normal. The right ventricular  size is normal. Tricuspid regurgitation signal is inadequate for assessing  PA pressure.   3. Left atrial size was moderately  dilated.   4. Right atrial size was severely dilated.   5. The pericardial effusion is circumferential.   6. The mitral valve is abnormal. Trivial mitral valve regurgitation.   7. Tricuspid valve regurgitation is mild to moderate.   8. The aortic valve is calcified. Aortic valve regurgitation is not  visualized. Mild aortic valve stenosis. Aortic valve area, by VTI measures  1.22 cm. Aortic valve mean gradient measures 10.0 mmHg. Aortic valve Vmax  measures 2.06 m/s. DI is 0.43.   Comparison(s): Changes from prior study are noted. 10/12/2020: LVEF 55-60%,  mild AS - mean gradient 13 mmHg, RVSP 41 mmHg.  Recent Labs: 10/31/2022: Creatinine, Ser 0.90  Recent Lipid Panel    Component Value Date/Time   CHOL 169 11/03/2014 0712   TRIG 193 (H) 11/03/2014 0712   HDL 40 (L) 11/03/2014 0712   CHOLHDL 4.2 11/03/2014 0712   VLDL 39 (H) 11/03/2014 0712   LDLCALC 90 11/03/2014 0712    Review of Systems    All other systems reviewed and are otherwise negative except as noted above.  Physical Exam    VS:  BP 138/84   Pulse 60   Ht 5' 4 (1.626 m)   Wt (!) 311 lb 3.2 oz (141.2 kg)   SpO2 95%   BMI 53.42 kg/m  , BMI Body mass index is 53.42 kg/m.  Wt Readings from Last 3 Encounters:  09/13/23 (!) 311 lb 3.2 oz (141.2 kg)  01/16/23 (!) 325 lb (147.4 kg)  11/15/22 (!) 328 lb (148.8 kg)     GEN: Morbidly obese, 68 y.o. female in no acute distress, wearing chronic O2 HEENT: normal. Neck: Supple, no JVD, carotid bruits, or masses. Cardiac: S1/S2, Grade 1/6 murmur, irregular rhythm and regular rate, no rubs, no gallops. No clubbing, cyanosis. Chronic lymphedema. Radials/PT 2+ and equal bilaterally.  Respiratory:  Respirations regular and unlabored, clear to auscultation bilaterally. MS: No deformity or atrophy. Skin: Warm and dry, no rash. Neuro:  Strength and sensation are intact. Psych: Normal affect.  Assessment & Plan    Chronic diastolic CHF Echo revealed EF 16-10%, no RWMA,  mild LVH. Weight is down. Euvolemic and well compensated on exam. Continue current medication regimen. Low sodium diet, fluid restriction <2L, and daily weights encouraged. Educated to contact our office for weight gain of 2 lbs overnight or 5 lbs in one week. Heart healthy diet and regular cardiovascular exercise encouraged.   Persistent A-fib Occasional, brief palpitations, not bothersome/stable per her report. Will continue to monitor. Continue Toprol  XL and diltiazem . Continue Eliquis  5 mg BID, denies any bleeding issues, on appropriate dosage.   Aortic valve stenosis, valvular insufficiency Echo 10/2022 revealed moderate paradoxical low flow low gradient aortic stenosis with mean gradient 12 mmHg, moderate calcification along aortic valve.  Denies any symptoms.  Tricuspid valve regurgitation noted to be mild to moderate with mild MR.  Plan to update echocardiogram at next office visit.  No medication changes. Heart healthy diet and regular cardiovascular exercise encouraged.   HTN BP stable. Discussed to monitor BP at home at least 2 hours after medications and sitting for 5-10 minutes. No medication changes.  Heart healthy diet and regular cardiovascular exercise encouraged.   Nocturnal hypoxia On chronic O2 continuously.  Continue follow-up with pulmonology as scheduled.  6. Morbid obesity  Congratulated her on her weight loss so far. Weight loss via diet and exercise encouraged. Discussed the impact being overweight would have on cardiovascular risk. Continue current meds.    Disposition: Follow up in 6 month(s) with Armida Lander, MD or APP.  Signed, Lasalle Pointer, NP

## 2023-10-02 ENCOUNTER — Other Ambulatory Visit: Payer: Self-pay | Admitting: Cardiology

## 2023-10-07 ENCOUNTER — Telehealth: Payer: Self-pay | Admitting: Cardiology

## 2023-10-07 NOTE — Telephone Encounter (Signed)
 Advised that torsemide  refill sent to [facility]. Verbalized understanding.

## 2023-10-07 NOTE — Telephone Encounter (Signed)
 Pt called and stated that a request for her torsemide  to be refilled was sent in last week and the request has not been approved. She would like to know why. She uses [facility].

## 2023-10-22 ENCOUNTER — Telehealth: Payer: Self-pay | Admitting: *Deleted

## 2023-10-22 NOTE — Telephone Encounter (Signed)
 Received fax from [facility] pharmacy requesting refill on ALLOPURINOL  300MG  TABLET  QTY WRITTEN 30  LAST FILLED: 10/02/23  DIRECTIONS: TAKE ONE TABLET BY MOUTH DAILY

## 2023-10-23 MED ORDER — ALLOPURINOL 300 MG PO TABS: 300.0000 mg | ORAL_TABLET | Freq: Every day | ORAL | 5 refills | Status: AC

## 2023-11-22 ENCOUNTER — Encounter: Payer: Self-pay | Admitting: Radiology

## 2024-01-29 ENCOUNTER — Other Ambulatory Visit: Payer: Self-pay | Admitting: Cardiology

## 2024-01-30 MED ORDER — DILTIAZEM HCL ER COATED BEADS 300 MG PO CP24: 300.0000 mg | ORAL_CAPSULE | Freq: Every day | ORAL | 2 refills | Status: AC

## 2024-02-03 ENCOUNTER — Encounter: Payer: Self-pay | Admitting: Radiology

## 2024-03-13 ENCOUNTER — Ambulatory Visit: Payer: Self-pay | Attending: Nurse Practitioner | Admitting: Nurse Practitioner

## 2024-03-13 ENCOUNTER — Telehealth: Payer: Self-pay | Admitting: Nurse Practitioner

## 2024-03-13 ENCOUNTER — Encounter: Payer: Self-pay | Admitting: Nurse Practitioner

## 2024-03-13 VITALS — BP 118/82 | HR 52 | Ht 63.0 in | Wt 293.4 lb

## 2024-03-13 DIAGNOSIS — I4819 Other persistent atrial fibrillation: Secondary | ICD-10-CM

## 2024-03-13 DIAGNOSIS — I5032 Chronic diastolic (congestive) heart failure: Secondary | ICD-10-CM

## 2024-03-13 DIAGNOSIS — I10 Essential (primary) hypertension: Secondary | ICD-10-CM | POA: Diagnosis not present

## 2024-03-13 DIAGNOSIS — E785 Hyperlipidemia, unspecified: Secondary | ICD-10-CM | POA: Diagnosis not present

## 2024-03-13 DIAGNOSIS — G4734 Idiopathic sleep related nonobstructive alveolar hypoventilation: Secondary | ICD-10-CM

## 2024-03-13 DIAGNOSIS — I350 Nonrheumatic aortic (valve) stenosis: Secondary | ICD-10-CM

## 2024-03-13 DIAGNOSIS — I38 Endocarditis, valve unspecified: Secondary | ICD-10-CM

## 2024-03-13 MED ORDER — PRAVASTATIN SODIUM 40 MG PO TABS: 40.0000 mg | ORAL_TABLET | Freq: Every day | ORAL | 6 refills | Status: AC

## 2024-03-13 NOTE — Patient Instructions (Addendum)
 Medication Instructions:  Continue all current medications.   Labwork: none  Testing/Procedures: none  Follow-Up: 6 months   Any Other Special Instructions Will Be Listed Below (If Applicable).   If you need a refill on your cardiac medications before your next appointment, please call your pharmacy.

## 2024-03-13 NOTE — Progress Notes (Signed)
 Office Visit    Patient Name: Jasmin Martin Date of Encounter: 03/13/2024 PCP:  Benjamin Raina Elvis Boot, NP [facility]  Cardiologist:  Alvan Carrier, MD  Advanced Practice Provider:  No care team member to display Electrophysiologist:  None   Chief Complaint and HPI    Jasmin Martin is a 68 y.o. female with a hx of chronic diastolic CHF, A-fib, hypertension, aortic valve stenosis, renal insufficiency, nocturnal hypoxia, and obesity, who presents today for scheduled follow-up.  Echocardiogram in May 2023 revealed EF 65 to 70%, indeterminate diastolic function, normal RV.  Mild aortic valve stenosis noted with mean gradient along aortic valve 10 mmHg, overall stable findings from previous study.  Last seen by Dr. Carrier Alvan in January 19, 2022.  Weight was stable.  Was overall doing well at the time.  Had upcoming sleep apnea test in November.  09/13/2023 - Today she presents for follow-up.  She has lost a significant amount of weight since I last saw her.  She has lost over 30 pounds since the last visit per our scales.  However, she says she is lost in total close to nearly 100 pounds.  She is able to move better and says breathe better.  For activity because of her knees, she will stand up and dance occasionally, says she was unable to do that previously with her weight gain. Denies any chest pain, shortness of breath, palpitations, syncope, presyncope, dizziness, orthopnea, PND, swelling or significant weight changes, acute bleeding, or claudication.  03/13/2024 - Here for follow-up. Doing very well. She has lost more weight since I last saw her. She has lost 18 lbs since last office visit. Denies any chest pain, shortness of breath, palpitations, syncope, presyncope, dizziness, orthopnea, PND, swelling or significant weight changes, acute bleeding, or claudication. She is not sure if she is taking pravastatin .   SH: She has loved doing [employer] in  her free time.   EKGs/Labs/Other Studies Reviewed:   The following studies were reviewed today:   EKG:  EKG is not ordered today.   Echo 10/2022:  1. Left ventricular ejection fraction, by estimation, is 50 to 55%. The  left ventricle has low normal function. The left ventricle has no regional  wall motion abnormalities. Left ventricular diastolic parameters are  indeterminate.   2. Right ventricular systolic function is low normal. The right  ventricular size is mildly enlarged. There is severely elevated pulmonary  artery systolic pressure.   3. Left atrial size was moderately dilated.   4. Right atrial size was moderately dilated.   5. The mitral valve is abnormal. Mild mitral valve regurgitation. No  evidence of mitral stenosis.   6. The tricuspid valve is abnormal. Tricuspid valve regurgitation is mild  to moderate.   7. Moderate paradoxical low flow low gradient aortic stenosis. Mean grad  12, AVA VTI 1.07, DI 0.42, SVI 28. . The aortic valve is tricuspid. There  is moderate calcification of the aortic valve. There is moderate  thickening of the aortic valve. Aortic  valve regurgitation is not visualized.   8. The inferior vena cava is normal in size with greater than 50%  respiratory variability, suggesting right atrial pressure of 3 mmHg.  Echo 07/2021:   1. Left ventricular ejection fraction, by estimation, is 65 to 70%. Left  ventricular ejection fraction by PLAX is 66 %. The left ventricle has  normal function. The left ventricle has no regional wall motion  abnormalities. There is  mild left ventricular  hypertrophy. Left ventricular diastolic function could not be evaluated.   2. Right ventricular systolic function is normal. The right ventricular  size is normal. Tricuspid regurgitation signal is inadequate for assessing  PA pressure.   3. Left atrial size was moderately dilated.   4. Right atrial size was severely dilated.   5. The pericardial effusion is  circumferential.   6. The mitral valve is abnormal. Trivial mitral valve regurgitation.   7. Tricuspid valve regurgitation is mild to moderate.   8. The aortic valve is calcified. Aortic valve regurgitation is not  visualized. Mild aortic valve stenosis. Aortic valve area, by VTI measures  1.22 cm. Aortic valve mean gradient measures 10.0 mmHg. Aortic valve Vmax  measures 2.06 m/s. DI is 0.43.   Comparison(s): Changes from prior study are noted. 10/12/2020: LVEF 55-60%,  mild AS - mean gradient 13 mmHg, RVSP 41 mmHg.  Recent Labs: No results found for requested labs within last 365 days.  Recent Lipid Panel    Component Value Date/Time   CHOL 169 11/03/2014 0712   TRIG 193 (H) 11/03/2014 0712   HDL 40 (L) 11/03/2014 0712   CHOLHDL 4.2 11/03/2014 0712   VLDL 39 (H) 11/03/2014 0712   LDLCALC 90 11/03/2014 0712    Review of Systems    All other systems reviewed and are otherwise negative except as noted above.  Physical Exam    VS:  BP 118/82   Pulse (!) 52   Ht 5' 3 (1.6 m)   Wt 293 lb 6.4 oz (133.1 kg)   SpO2 94%   BMI 51.97 kg/m  , BMI Body mass index is 51.97 kg/m.  Wt Readings from Last 3 Encounters:  03/13/24 293 lb 6.4 oz (133.1 kg)  09/13/23 (!) 311 lb 3.2 oz (141.2 kg)  01/16/23 (!) 325 lb (147.4 kg)     GEN: Morbidly obese, 68 y.o. female in no acute distress, wearing chronic O2 HEENT: normal. Neck: Supple, no JVD, carotid bruits, or masses. Cardiac: S1/S2, Grade 1/6 murmur, irregular rhythm and regular rate, no rubs, no gallops. No clubbing, cyanosis. Chronic lymphedema. Radials/PT 2+ and equal bilaterally.  Respiratory:  Respirations regular and unlabored, clear to auscultation bilaterally. MS: No deformity or atrophy. Skin: Warm and dry, no rash. Neuro:  Strength and sensation are intact. Psych: Normal affect.  Assessment & Plan    Chronic diastolic CHF Echo revealed EF 34-29%, no RWMA, mild LVH. Weight is down. Euvolemic and well compensated on  exam. Continue current medication regimen. Low sodium diet, fluid restriction <2L, and daily weights encouraged. Educated to contact our office for weight gain of 2 lbs overnight or 5 lbs in one week. Heart healthy diet and regular cardiovascular exercise encouraged.   Persistent A-fib Denies any recent palpitations or tachycardia. Continue Toprol  XL and diltiazem . Continue Eliquis  5 mg BID, denies any bleeding issues, on appropriate dosage.   Aortic valve stenosis, valvular insufficiency Echo 10/2022 revealed moderate paradoxical low flow low gradient aortic stenosis with mean gradient 12 mmHg, moderate calcification along aortic valve.  Denies any symptoms.  Tricuspid valve regurgitation noted to be mild to moderate with mild MR.  Plan to update echocardiogram at next office visit.  No medication changes. Heart healthy diet and regular cardiovascular exercise encouraged.   4. HLD Most recent LDL on file was 120, not sure about compliance with pravastatin . She will go home and update us  if she is taking this. Discussed medication compliance. Heart healthy diet and regular  cardiovascular exercise encouraged.   5. HTN BP stable. Discussed to monitor BP at home at least 2 hours after medications and sitting for 5-10 minutes. No medication changes. Heart healthy diet and regular cardiovascular exercise encouraged.   6. Nocturnal hypoxia On chronic O2 continuously.  Continue follow-up with pulmonology as scheduled.  7. Morbid obesity  Congratulated her on her weight loss so far. Weight loss via diet and exercise encouraged. Discussed the impact being overweight would have on cardiovascular risk. Continue current meds.    Disposition: Follow up in 6 month(s) with Alvan Carrier, MD or APP.  Signed, Almarie Crate, NP

## 2024-03-13 NOTE — Telephone Encounter (Signed)
 Per DPR- can leave detailed message. Advised pt to restart Pravastatin  40 mg once daily. Advised pt will need FLP/LFT 2-3 post medication start. Medication sent to [facility] labs ordered through [facility].

## 2024-03-13 NOTE — Telephone Encounter (Signed)
 Pt c/o medication issue:  1. Name of Medication:  pravastatin  (PRAVACHOL ) 40 MG tablet  2. How are you currently taking this medication (dosage and times per day)?   3. Are you having a reaction (difficulty breathing--STAT)?   4. What is your medication issue?   Patient would like to know if she needs to go back on Pravastatin . Please advise.

## 2024-03-23 ENCOUNTER — Telehealth: Payer: Self-pay | Admitting: Nurse Practitioner

## 2024-03-23 NOTE — Telephone Encounter (Signed)
 Pt c/o medication issue:  1. Name of Medication:   pravastatin  (PRAVACHOL ) 40 MG tablet    2. How are you currently taking this medication (dosage and times per day)? As written   3. Are you having a reaction (difficulty breathing--STAT)? No   4. What is your medication issue? Pt called in to inform Miriam, NP that she stopped taking this med because she had night sweats and balance issues. She states the balance issues have gone away and the night sweats have went away some.

## 2024-03-23 NOTE — Telephone Encounter (Signed)
 Patient notified.  States she does not feel comfortable with going back on any cholesterol medications right now.  States that she is feeling some better, but says this medication did a number on her.  Says she will work on diet & has lost 116lbs recently.

## 2024-03-23 NOTE — Telephone Encounter (Signed)
 Notified patient via Clinical cytogeneticist

## 2024-09-15 ENCOUNTER — Ambulatory Visit: Admitting: Nurse Practitioner
